# Patient Record
Sex: Female | Born: 1940
Health system: Southern US, Community
[De-identification: ages and names within clinical notes are randomized; demographics above are authoritative.]

## PROBLEM LIST (undated history)

## (undated) DIAGNOSIS — I10 Essential (primary) hypertension: Secondary | ICD-10-CM

## (undated) DIAGNOSIS — M545 Low back pain, unspecified: Secondary | ICD-10-CM

## (undated) DIAGNOSIS — E039 Hypothyroidism, unspecified: Secondary | ICD-10-CM

## (undated) DIAGNOSIS — L930 Discoid lupus erythematosus: Secondary | ICD-10-CM

## (undated) DIAGNOSIS — K219 Gastro-esophageal reflux disease without esophagitis: Secondary | ICD-10-CM

## (undated) DIAGNOSIS — E669 Obesity, unspecified: Secondary | ICD-10-CM

## (undated) DIAGNOSIS — G4733 Obstructive sleep apnea (adult) (pediatric): Secondary | ICD-10-CM

## (undated) DIAGNOSIS — I739 Peripheral vascular disease, unspecified: Secondary | ICD-10-CM

## (undated) DIAGNOSIS — I2510 Atherosclerotic heart disease of native coronary artery without angina pectoris: Secondary | ICD-10-CM

## (undated) DIAGNOSIS — E785 Hyperlipidemia, unspecified: Secondary | ICD-10-CM

## (undated) DIAGNOSIS — M51369 Other intervertebral disc degeneration, lumbar region without mention of lumbar back pain or lower extremity pain: Secondary | ICD-10-CM

## (undated) DIAGNOSIS — R0600 Dyspnea, unspecified: Secondary | ICD-10-CM

## (undated) DIAGNOSIS — C50919 Malignant neoplasm of unspecified site of unspecified female breast: Secondary | ICD-10-CM

## (undated) DIAGNOSIS — N289 Disorder of kidney and ureter, unspecified: Secondary | ICD-10-CM

## (undated) DIAGNOSIS — M5136 Other intervertebral disc degeneration, lumbar region: Secondary | ICD-10-CM

## (undated) HISTORY — DX: Peripheral vascular disease, unspecified: I73.9

## (undated) HISTORY — PX: OTHER SURGICAL HISTORY: SHX169

## (undated) HISTORY — DX: Dyspnea, unspecified: R06.00

## (undated) HISTORY — DX: Gastro-esophageal reflux disease without esophagitis: K21.9

## (undated) HISTORY — DX: Low back pain: M54.5

## (undated) HISTORY — DX: Obesity, unspecified: E66.9

## (undated) HISTORY — DX: Malignant neoplasm of unspecified site of unspecified female breast: C50.919

## (undated) HISTORY — DX: Discoid lupus erythematosus: L93.0

## (undated) HISTORY — DX: Hyperlipidemia, unspecified: E78.5

## (undated) HISTORY — PX: ELBOW SURGERY: SHX618

## (undated) HISTORY — DX: Atherosclerotic heart disease of native coronary artery without angina pectoris: I25.10

## (undated) HISTORY — DX: Obstructive sleep apnea (adult) (pediatric): G47.33

## (undated) HISTORY — DX: Essential (primary) hypertension: I10

## (undated) HISTORY — DX: Low back pain, unspecified: M54.50

## (undated) HISTORY — PX: RENAL ARTERY STENT: SHX2321

## (undated) HISTORY — DX: Other intervertebral disc degeneration, lumbar region without mention of lumbar back pain or lower extremity pain: M51.369

## (undated) HISTORY — DX: Other intervertebral disc degeneration, lumbar region: M51.36

## (undated) HISTORY — DX: Hypothyroidism, unspecified: E03.9

## (undated) HISTORY — PX: OOPHORECTOMY: SHX86

---

## 1983-07-23 HISTORY — PX: MASTECTOMY: SHX3

## 1995-07-23 HISTORY — PX: CORONARY ARTERY BYPASS GRAFT: SHX141

## 2000-05-15 ENCOUNTER — Ambulatory Visit (HOSPITAL_COMMUNITY): Admission: RE | Admit: 2000-05-15 | Discharge: 2000-05-15 | Payer: Self-pay | Admitting: *Deleted

## 2001-07-22 HISTORY — PX: CORONARY ANGIOPLASTY WITH STENT PLACEMENT: SHX49

## 2001-11-13 ENCOUNTER — Encounter: Admission: RE | Admit: 2001-11-13 | Discharge: 2001-11-13 | Payer: Self-pay | Admitting: Internal Medicine

## 2001-11-13 ENCOUNTER — Encounter: Payer: Self-pay | Admitting: Internal Medicine

## 2002-01-07 ENCOUNTER — Encounter: Payer: Self-pay | Admitting: *Deleted

## 2002-01-07 ENCOUNTER — Ambulatory Visit (HOSPITAL_COMMUNITY): Admission: RE | Admit: 2002-01-07 | Discharge: 2002-01-07 | Payer: Self-pay | Admitting: *Deleted

## 2004-01-20 HISTORY — PX: ANKLE SURGERY: SHX546

## 2004-02-15 ENCOUNTER — Inpatient Hospital Stay (HOSPITAL_COMMUNITY): Admission: EM | Admit: 2004-02-15 | Discharge: 2004-02-20 | Payer: Self-pay | Admitting: Emergency Medicine

## 2005-02-20 ENCOUNTER — Ambulatory Visit: Payer: Self-pay | Admitting: Internal Medicine

## 2005-03-11 ENCOUNTER — Ambulatory Visit: Payer: Self-pay | Admitting: Family Medicine

## 2005-06-10 ENCOUNTER — Ambulatory Visit: Payer: Self-pay | Admitting: Internal Medicine

## 2005-10-30 ENCOUNTER — Ambulatory Visit: Payer: Self-pay | Admitting: Internal Medicine

## 2006-06-20 ENCOUNTER — Ambulatory Visit: Payer: Self-pay | Admitting: Internal Medicine

## 2006-06-20 ENCOUNTER — Inpatient Hospital Stay (HOSPITAL_COMMUNITY): Admission: EM | Admit: 2006-06-20 | Discharge: 2006-06-24 | Payer: Self-pay | Admitting: Emergency Medicine

## 2006-10-30 ENCOUNTER — Ambulatory Visit: Payer: Self-pay | Admitting: Internal Medicine

## 2006-10-30 LAB — CONVERTED CEMR LAB
ALT: 17 units/L (ref 0–40)
AST: 24 units/L (ref 0–37)
Albumin: 3.6 g/dL (ref 3.5–5.2)
Alkaline Phosphatase: 115 units/L (ref 39–117)
BUN: 14 mg/dL (ref 6–23)
Bacteria, UA: NEGATIVE
Basophils Absolute: 0 10*3/uL (ref 0.0–0.1)
Basophils Relative: 0.7 % (ref 0.0–1.0)
Bilirubin Urine: NEGATIVE
Bilirubin, Direct: 0.1 mg/dL (ref 0.0–0.3)
CO2: 29 meq/L (ref 19–32)
Calcium: 9.6 mg/dL (ref 8.4–10.5)
Chloride: 109 meq/L (ref 96–112)
Cholesterol: 133 mg/dL (ref 0–200)
Creatinine, Ser: 1.1 mg/dL (ref 0.4–1.2)
Crystals: NEGATIVE
Eosinophils Absolute: 0.1 10*3/uL (ref 0.0–0.6)
Eosinophils Relative: 1.5 % (ref 0.0–5.0)
GFR calc Af Amer: 64 mL/min
GFR calc non Af Amer: 53 mL/min
Glucose, Bld: 102 mg/dL — ABNORMAL HIGH (ref 70–99)
HCT: 37.9 % (ref 36.0–46.0)
HDL: 29.7 mg/dL — ABNORMAL LOW (ref >39.0)
Hemoglobin, Urine: NEGATIVE
Hemoglobin: 12.4 g/dL (ref 12.0–15.0)
Ketones, ur: NEGATIVE mg/dL
LDL Cholesterol: 84 mg/dL (ref 0–99)
Lymphocytes Relative: 28.9 % (ref 12.0–46.0)
MCHC: 32.7 g/dL (ref 30.0–36.0)
MCV: 85.3 fL (ref 78.0–100.0)
Monocytes Absolute: 0.8 10*3/uL — ABNORMAL HIGH (ref 0.2–0.7)
Monocytes Relative: 13.1 % — ABNORMAL HIGH (ref 3.0–11.0)
Neutro Abs: 3.5 10*3/uL (ref 1.4–7.7)
Neutrophils Relative %: 55.8 % (ref 43.0–77.0)
Nitrite: NEGATIVE
Platelets: 263 10*3/uL (ref 150–400)
Potassium: 4.6 meq/L (ref 3.5–5.1)
RBC: 4.44 M/uL (ref 3.87–5.11)
RDW: 14.5 % (ref 11.5–14.6)
Sodium: 143 meq/L (ref 135–145)
Specific Gravity, Urine: 1.015 (ref 1.000–1.03)
TSH: 2.27 microintl units/mL (ref 0.35–5.50)
Total Bilirubin: 0.9 mg/dL (ref 0.3–1.2)
Total CHOL/HDL Ratio: 4.5
Total Protein, Urine: NEGATIVE mg/dL
Total Protein: 8.2 g/dL (ref 6.0–8.3)
Triglycerides: 99 mg/dL (ref 0–149)
Urine Glucose: NEGATIVE mg/dL
Urobilinogen, UA: 0.2 (ref 0.0–1.0)
VLDL: 20 mg/dL (ref 0–40)
WBC: 6.2 10*3/uL (ref 4.5–10.5)
pH: 5.5 (ref 5.0–8.0)

## 2006-12-10 ENCOUNTER — Emergency Department (HOSPITAL_COMMUNITY): Admission: EM | Admit: 2006-12-10 | Discharge: 2006-12-10 | Payer: Self-pay | Admitting: Emergency Medicine

## 2007-01-02 ENCOUNTER — Ambulatory Visit: Payer: Self-pay | Admitting: Endocrinology

## 2007-01-02 LAB — CONVERTED CEMR LAB: Uric Acid, Serum: 9.1 mg/dL — ABNORMAL HIGH (ref 2.4–7.0)

## 2007-01-08 ENCOUNTER — Ambulatory Visit: Payer: Self-pay | Admitting: Internal Medicine

## 2007-03-21 ENCOUNTER — Emergency Department (HOSPITAL_COMMUNITY): Admission: EM | Admit: 2007-03-21 | Discharge: 2007-03-22 | Payer: Self-pay | Admitting: Emergency Medicine

## 2007-06-11 DIAGNOSIS — Z853 Personal history of malignant neoplasm of breast: Secondary | ICD-10-CM | POA: Insufficient documentation

## 2007-06-11 DIAGNOSIS — M545 Low back pain, unspecified: Secondary | ICD-10-CM | POA: Insufficient documentation

## 2007-06-11 DIAGNOSIS — I2510 Atherosclerotic heart disease of native coronary artery without angina pectoris: Secondary | ICD-10-CM | POA: Insufficient documentation

## 2007-06-11 DIAGNOSIS — I10 Essential (primary) hypertension: Secondary | ICD-10-CM | POA: Insufficient documentation

## 2007-06-11 DIAGNOSIS — M810 Age-related osteoporosis without current pathological fracture: Secondary | ICD-10-CM | POA: Insufficient documentation

## 2007-06-11 DIAGNOSIS — I739 Peripheral vascular disease, unspecified: Secondary | ICD-10-CM | POA: Insufficient documentation

## 2007-06-11 DIAGNOSIS — K219 Gastro-esophageal reflux disease without esophagitis: Secondary | ICD-10-CM | POA: Insufficient documentation

## 2007-06-11 DIAGNOSIS — E039 Hypothyroidism, unspecified: Secondary | ICD-10-CM | POA: Insufficient documentation

## 2007-06-11 DIAGNOSIS — M109 Gout, unspecified: Secondary | ICD-10-CM | POA: Insufficient documentation

## 2007-06-11 DIAGNOSIS — E785 Hyperlipidemia, unspecified: Secondary | ICD-10-CM | POA: Insufficient documentation

## 2007-06-12 ENCOUNTER — Ambulatory Visit: Payer: Self-pay | Admitting: Internal Medicine

## 2007-06-12 DIAGNOSIS — L930 Discoid lupus erythematosus: Secondary | ICD-10-CM | POA: Insufficient documentation

## 2007-06-12 LAB — CONVERTED CEMR LAB
ALT: 17 units/L (ref 0–35)
AST: 21 units/L (ref 0–37)
Albumin: 3.7 g/dL (ref 3.5–5.2)
Alkaline Phosphatase: 129 units/L — ABNORMAL HIGH (ref 39–117)
Bilirubin, Direct: 0.1 mg/dL (ref 0.0–0.3)
Cholesterol: 148 mg/dL (ref 0–200)
HDL: 27.8 mg/dL — ABNORMAL LOW (ref >39.0)
LDL Cholesterol: 105 mg/dL — ABNORMAL HIGH (ref 0–99)
Rhuematoid fact SerPl-aCnc: 21.2 intl units/mL — ABNORMAL HIGH (ref 0.0–20.0)
Sed Rate: 79 mm/hr — ABNORMAL HIGH (ref 0–25)
Total Bilirubin: 1 mg/dL (ref 0.3–1.2)
Total CHOL/HDL Ratio: 5.3
Total Protein: 8.6 g/dL — ABNORMAL HIGH (ref 6.0–8.3)
Triglycerides: 78 mg/dL (ref 0–149)
VLDL: 16 mg/dL (ref 0–40)

## 2007-06-16 LAB — CONVERTED CEMR LAB
ANA Titer 1: 1:640 {titer} — ABNORMAL HIGH
Anti Nuclear Antibody(ANA): POSITIVE — AB
ds DNA Ab: 43 — ABNORMAL HIGH (ref <5)

## 2007-06-26 ENCOUNTER — Encounter: Payer: Self-pay | Admitting: Internal Medicine

## 2007-08-04 ENCOUNTER — Encounter: Payer: Self-pay | Admitting: Internal Medicine

## 2007-08-05 ENCOUNTER — Encounter: Payer: Self-pay | Admitting: Internal Medicine

## 2007-09-28 ENCOUNTER — Encounter: Payer: Self-pay | Admitting: Internal Medicine

## 2007-10-28 ENCOUNTER — Encounter: Payer: Self-pay | Admitting: Internal Medicine

## 2007-11-05 ENCOUNTER — Encounter: Admission: RE | Admit: 2007-11-05 | Discharge: 2007-11-05 | Payer: Self-pay | Admitting: Cardiovascular Disease

## 2007-11-10 ENCOUNTER — Inpatient Hospital Stay (HOSPITAL_COMMUNITY): Admission: RE | Admit: 2007-11-10 | Discharge: 2007-11-11 | Payer: Self-pay | Admitting: Cardiovascular Disease

## 2008-01-25 ENCOUNTER — Observation Stay (HOSPITAL_COMMUNITY): Admission: RE | Admit: 2008-01-25 | Discharge: 2008-01-26 | Payer: Self-pay | Admitting: Cardiovascular Disease

## 2008-02-17 ENCOUNTER — Encounter: Payer: Self-pay | Admitting: Internal Medicine

## 2008-03-08 ENCOUNTER — Encounter: Payer: Self-pay | Admitting: Internal Medicine

## 2008-03-22 ENCOUNTER — Encounter: Payer: Self-pay | Admitting: Internal Medicine

## 2008-06-07 ENCOUNTER — Telehealth: Payer: Self-pay | Admitting: Internal Medicine

## 2008-06-10 ENCOUNTER — Encounter: Payer: Self-pay | Admitting: Internal Medicine

## 2008-08-01 ENCOUNTER — Encounter: Payer: Self-pay | Admitting: Internal Medicine

## 2008-08-18 ENCOUNTER — Ambulatory Visit: Payer: Self-pay | Admitting: Oncology

## 2008-09-12 LAB — COMPREHENSIVE METABOLIC PANEL
ALT: 20 U/L (ref 0–35)
AST: 28 U/L (ref 0–37)
Albumin: 3.6 g/dL (ref 3.5–5.2)
Alkaline Phosphatase: 104 U/L (ref 39–117)
BUN: 11 mg/dL (ref 6–23)
CO2: 25 mEq/L (ref 19–32)
Calcium: 9.2 mg/dL (ref 8.4–10.5)
Chloride: 110 mEq/L (ref 96–112)
Creatinine, Ser: 1.09 mg/dL (ref 0.40–1.20)
Glucose, Bld: 97 mg/dL (ref 70–99)
Potassium: 3.9 mEq/L (ref 3.5–5.3)
Sodium: 141 mEq/L (ref 135–145)
Total Bilirubin: 0.9 mg/dL (ref 0.3–1.2)
Total Protein: 6.7 g/dL (ref 6.0–8.3)

## 2008-09-12 LAB — CBC WITH DIFFERENTIAL/PLATELET
BASO%: 0.8 % (ref 0.0–2.0)
Basophils Absolute: 0 10*3/uL (ref 0.0–0.1)
EOS%: 3.2 % (ref 0.0–7.0)
Eosinophils Absolute: 0.2 10*3/uL (ref 0.0–0.5)
HCT: 37.2 % (ref 34.8–46.6)
HGB: 12.5 g/dL (ref 11.6–15.9)
LYMPH%: 35.3 % (ref 14.0–49.7)
MCH: 28.4 pg (ref 25.1–34.0)
MCHC: 33.7 g/dL (ref 31.5–36.0)
MCV: 84.2 fL (ref 79.5–101.0)
MONO#: 0.5 10*3/uL (ref 0.1–0.9)
MONO%: 9.8 % (ref 0.0–14.0)
NEUT#: 2.6 10*3/uL (ref 1.5–6.5)
NEUT%: 50.9 % (ref 38.4–76.8)
Platelets: 220 10*3/uL (ref 145–400)
RBC: 4.42 10*6/uL (ref 3.70–5.45)
RDW: 14.2 % (ref 11.2–14.5)
WBC: 5 10*3/uL (ref 3.9–10.3)
lymph#: 1.8 10*3/uL (ref 0.9–3.3)

## 2008-09-12 LAB — LACTATE DEHYDROGENASE: LDH: 169 U/L (ref 94–250)

## 2008-11-22 ENCOUNTER — Telehealth: Payer: Self-pay | Admitting: Internal Medicine

## 2008-12-12 ENCOUNTER — Encounter: Admission: RE | Admit: 2008-12-12 | Discharge: 2008-12-12 | Payer: Self-pay | Admitting: Family Medicine

## 2008-12-14 ENCOUNTER — Inpatient Hospital Stay (HOSPITAL_COMMUNITY): Admission: EM | Admit: 2008-12-14 | Discharge: 2008-12-16 | Payer: Self-pay | Admitting: Emergency Medicine

## 2008-12-15 ENCOUNTER — Encounter (INDEPENDENT_AMBULATORY_CARE_PROVIDER_SITE_OTHER): Payer: Self-pay | Admitting: Internal Medicine

## 2009-03-23 ENCOUNTER — Ambulatory Visit: Payer: Self-pay | Admitting: Oncology

## 2009-03-28 LAB — CBC WITH DIFFERENTIAL/PLATELET
BASO%: 0.8 % (ref 0.0–2.0)
Basophils Absolute: 0 10*3/uL (ref 0.0–0.1)
EOS%: 2.4 % (ref 0.0–7.0)
Eosinophils Absolute: 0.1 10*3/uL (ref 0.0–0.5)
HCT: 36.2 % (ref 34.8–46.6)
HGB: 11.9 g/dL (ref 11.6–15.9)
LYMPH%: 36.5 % (ref 14.0–49.7)
MCH: 28.3 pg (ref 25.1–34.0)
MCHC: 32.9 g/dL (ref 31.5–36.0)
MCV: 86.2 fL (ref 79.5–101.0)
MONO#: 0.5 10*3/uL (ref 0.1–0.9)
MONO%: 10.2 % (ref 0.0–14.0)
NEUT#: 2.2 10*3/uL (ref 1.5–6.5)
NEUT%: 50.1 % (ref 38.4–76.8)
Platelets: 199 10*3/uL (ref 145–400)
RBC: 4.2 10*6/uL (ref 3.70–5.45)
RDW: 14.5 % (ref 11.2–14.5)
WBC: 4.5 10*3/uL (ref 3.9–10.3)
lymph#: 1.6 10*3/uL (ref 0.9–3.3)

## 2009-03-28 LAB — COMPREHENSIVE METABOLIC PANEL
ALT: 13 U/L (ref 0–35)
AST: 19 U/L (ref 0–37)
Albumin: 4 g/dL (ref 3.5–5.2)
Alkaline Phosphatase: 95 U/L (ref 39–117)
BUN: 15 mg/dL (ref 6–23)
CO2: 22 mEq/L (ref 19–32)
Calcium: 9.2 mg/dL (ref 8.4–10.5)
Chloride: 109 mEq/L (ref 96–112)
Creatinine, Ser: 1.13 mg/dL (ref 0.40–1.20)
Glucose, Bld: 81 mg/dL (ref 70–99)
Potassium: 4.1 mEq/L (ref 3.5–5.3)
Sodium: 143 mEq/L (ref 135–145)
Total Bilirubin: 0.6 mg/dL (ref 0.3–1.2)
Total Protein: 6.9 g/dL (ref 6.0–8.3)

## 2009-03-28 LAB — LACTATE DEHYDROGENASE: LDH: 194 U/L (ref 94–250)

## 2009-09-21 ENCOUNTER — Ambulatory Visit: Payer: Self-pay | Admitting: Oncology

## 2009-09-25 LAB — COMPREHENSIVE METABOLIC PANEL
ALT: 11 U/L (ref 0–35)
AST: 16 U/L (ref 0–37)
Albumin: 3.6 g/dL (ref 3.5–5.2)
Alkaline Phosphatase: 103 U/L (ref 39–117)
BUN: 11 mg/dL (ref 6–23)
CO2: 25 mEq/L (ref 19–32)
Calcium: 8.9 mg/dL (ref 8.4–10.5)
Chloride: 108 mEq/L (ref 96–112)
Creatinine, Ser: 1.05 mg/dL (ref 0.40–1.20)
Glucose, Bld: 76 mg/dL (ref 70–99)
Potassium: 3.9 mEq/L (ref 3.5–5.3)
Sodium: 143 mEq/L (ref 135–145)
Total Bilirubin: 0.6 mg/dL (ref 0.3–1.2)
Total Protein: 6.9 g/dL (ref 6.0–8.3)

## 2009-09-25 LAB — CBC WITH DIFFERENTIAL/PLATELET
BASO%: 0.4 % (ref 0.0–2.0)
Basophils Absolute: 0 10*3/uL (ref 0.0–0.1)
EOS%: 2.4 % (ref 0.0–7.0)
Eosinophils Absolute: 0.2 10*3/uL (ref 0.0–0.5)
HCT: 37.2 % (ref 34.8–46.6)
HGB: 12.1 g/dL (ref 11.6–15.9)
LYMPH%: 21.6 % (ref 14.0–49.7)
MCH: 28.5 pg (ref 25.1–34.0)
MCHC: 32.5 g/dL (ref 31.5–36.0)
MCV: 87.5 fL (ref 79.5–101.0)
MONO#: 0.8 10*3/uL (ref 0.1–0.9)
MONO%: 10.9 % (ref 0.0–14.0)
NEUT#: 4.6 10*3/uL (ref 1.5–6.5)
NEUT%: 64.7 % (ref 38.4–76.8)
Platelets: 251 10*3/uL (ref 145–400)
RBC: 4.26 10*6/uL (ref 3.70–5.45)
RDW: 15.4 % — ABNORMAL HIGH (ref 11.2–14.5)
WBC: 7.1 10*3/uL (ref 3.9–10.3)
lymph#: 1.5 10*3/uL (ref 0.9–3.3)

## 2009-09-25 LAB — LACTATE DEHYDROGENASE: LDH: 182 U/L (ref 94–250)

## 2009-11-21 ENCOUNTER — Encounter: Payer: Self-pay | Admitting: Cardiology

## 2010-01-03 ENCOUNTER — Ambulatory Visit: Payer: Self-pay | Admitting: Cardiology

## 2010-01-03 DIAGNOSIS — R0602 Shortness of breath: Secondary | ICD-10-CM | POA: Insufficient documentation

## 2010-08-21 NOTE — Consult Note (Signed)
Summary: Claria Dice   Imported By: Esmeralda Links D'jimraou 08/11/2007 12:04:55  _____________________________________________________________________  External Attachment:    Type:   Image     Comment:   External Document

## 2010-08-21 NOTE — Consult Note (Signed)
Summary: Stacey Drain, MD  Stacey Drain, MD   Imported By: Maryln Gottron 09/30/2007 15:00:30  _____________________________________________________________________  External Attachment:    Type:   Image     Comment:   External Document

## 2010-08-21 NOTE — Letter (Signed)
Summary: [facility]   Imported By: Debby Freiberg 01/16/2010 12:20:46  _____________________________________________________________________  External Attachment:    Type:   Image     Comment:   External Document

## 2010-08-21 NOTE — Assessment & Plan Note (Signed)
Summary: np6/get establish with cardio/jml   Primary Provider:  Dr. Parke Simmers  CC:  new patient.  Establish with cardiologist.  Stent in 99.  History of Present Illness: 70 yo with history of CAD s/p CABG, PAD, HTN, and exertional dyspnea presents for cardiology evaluation.  Patient's most recent cath was in 2003 and showed patent grafts.  She had a left common iliac artery stent in 2009.  She has been stable recently with no chest pain.  She is taking Imdur but has been enduring severe headaches with it for about a year and a half and really wants to stop it.  She has chronic exertional dyspnea.  She can walk on flat ground without problems (walks for 15 minutes daily for exercise) but gets short of breath with hills or steps.  She is short of breath with moderate housework such as vacuuming and sweeping.  No claudication.  No orthopnea/PND.  She does seem to be troubled by symptoms consistent with plantar fasciitis.    ECG: NSR, LVH, Qs in V1 and V2  Labs (5/11): K 4.3, creatinine 1.11, LDL 63, HDL 40 ' Current Medications (verified): 1)  Clonidine Hcl 0.1 Mg Tabs (Clonidine Hcl) .Marland Kitchen.. 1 By Mouth Bid 2)  Isosorbide Mononitrate Cr 30 Mg Tb24 (Isosorbide Mononitrate) .Marland Kitchen.. 1 Po Qd 3)  Lipitor 40 Mg Tabs (Atorvastatin Calcium) .Marland Kitchen.. 1 By Mouth Qd 4)  Lotrel 10-20 Mg Caps (Amlodipine Besy-Benazepril Hcl) .Marland Kitchen.. 1 By Mouth Qd 5)  Metoprolol Tartrate 50 Mg Tabs (Metoprolol Tartrate) .Marland Kitchen.. 1 By Mouth Bid 6)  Levothyroxine Sodium 125 Mcg Tabs (Levothyroxine Sodium) .... Once Daily 7)  Ecotrin 325 Mg  Tbec (Aspirin) .Marland Kitchen.. 1 By Mouth Qd 8)  Carvedilol 6.25 Mg Tabs (Carvedilol) .... Take One Tablet Two Times A Day 9)  Allopurinol 300 Mg Tabs (Allopurinol) .... Take One Tablet Once Daily 10)  Plavix 75 Mg Tabs (Clopidogrel Bisulfate) .... Take One Tablet Once Daily 11)  Hydroxychloroquine Sulfate 200 Mg Tabs (Hydroxychloroquine Sulfate) .... Take 2 Tablets By Mouth Once Daily 12)  Guanfacine Hcl 2 Mg Tabs  (Guanfacine Hcl) .... Take One Tablet By Mouth At Bedtime  Allergies (verified): No Known Drug Allergies  Past History:  Past Medical History: 1. Coronary artery disease: CABG 1997 with sequential LIMA-LAD and D, sequential SVG-PDA then PLV1 then PLV2.  There also was an SVG attached to the SVG-PDA that touches down on an OM.  Cath in 2003 showed patent grafts.  2. Hyperlipidemia 3. Hypertension 4. Hypothyroidism 5. Gout 6. Osteoporosis 7. Breast cancer s/p right mastectomy in 1985 8. GERD 9. Peripheral vascular disease: Left common iliac artery stent in 7/09.  Renal artery stenosis with left renal artery stent.  Cath in 4/09 showed 50% in-stent restenosis in left renal artery stent.  10. Low back pain/lumbar disc disease 11. Obesity 12. Discoid lupus 13. OSA, did not tolerate CPAP 14. Dyspnea: echo (5/10) with EF 55-60%, no regional wall motion abnormalities, mild LVH, mild diastolic dysfunction, mild aortic insufficiency, mild MR.   Family History: Reviewed history from 06/11/2007 and no changes required. Family History of CAD Female 1st degree relative Family History Diabetes 1st degree relative Family History Hypertension mother with cerebral aneurysm brother died with stroke  Social History: Former Smoker, quit 1997 Alcohol use-no Lives in [place].  Has 7 adopted children, 2 live with her now.  Retired.   Review of Systems       All systems reviewed and negative except as per HPI.   Vital Signs:  Patient profile:   70 year old female Height:      66 inches Weight:      216 pounds BMI:     34.99 Pulse rate:   79 / minute Pulse rhythm:   regular BP sitting:   148 / 78  (left arm) Cuff size:   large  Vitals Entered By: Judithe Modest CMA (January 03, 2010 3:08 PM)  Physical Exam  General:  Well developed, well nourished, in no acute distress.  Obese.  Head:  normocephalic and atraumatic Nose:  no deformity, discharge, inflammation, or lesions Mouth:  Teeth,  gums and palate normal. Oral mucosa normal. Neck:  Neck supple, no JVD. No masses, thyromegaly or abnormal cervical nodes. Lungs:  Clear bilaterally to auscultation and percussion. Heart:  Non-displaced PMI, chest non-tender; regular rate and rhythm, S1, S2 without rubs or gallops. 2/6 early systolic murmur RUSB.  Carotid upstroke normal, no bruit.  1+ PT and DP pulses bilaterally. No edema, no varicosities. Abdomen:  Bowel sounds positive; abdomen soft and non-tender without masses, organomegaly, or hernias noted. No hepatosplenomegaly. Msk:  Back normal, normal gait. Muscle strength and tone normal. Extremities:  No clubbing or cyanosis. Neurologic:  Alert and oriented x 3. Skin:  Intact without lesions or rashes. Psych:  Normal affect.   Impression & Recommendations:  Problem # 1:  CORONARY ARTERY DISEASE (ICD-414.00) Stable with no chest pain.  I think it would be reasonable to stop Imdur.  Instead, I will increase Coreg to 12.5 mg two times a day.  She will continue statin, ACEI, ASA, and Plavix.    Problem # 2:  PERIPHERAL VASCULAR DISEASE (ICD-443.9) Stable with no claudication and palpable pedal pulses.   Problem # 3:  HYPERLIPIDEMIA (ICD-272.4) LDL is < 70 (goal).  Continue statin.    Problem # 4:  HYPERTENSION (ICD-401.9) BP is mildly elevated.  Increasing Coreg (as above) should help.   Problem # 5:  DYSPNEA (ICD-786.05) Stable dyspnea with moderate exertion.  This is likely multifactorial from obesity/deconditioning and diastolic CHF.  Need to make sure that BP is under good control.  She does not appear volume overloaded on exam.    Other Orders: EKG w/ Interpretation (93000)  Patient Instructions: 1)  Your physician has recommended you make the following change in your medication:  2)  Increase Coreg(carvedilol) to 12.5mg  twice a day 3)  Your physician wants you to follow-up in: 6 months with Dr Shirlee Latch.   You will receive a reminder letter in the mail two months in  advance. If you don't receive a letter, please call our office to schedule the follow-up appointment. Prescriptions: COREG 12.5 MG TABS (CARVEDILOL) one tablet two times a day  #60 x 11   Entered by:   Katina Dung, RN, BSN   Authorized by:   Marca Ancona, MD   Signed by:   Katina Dung, RN, BSN on 01/03/2010   Method used:   Electronically to        [facility]  [phone]* (retail)       [address]       [place], [place]  [address]       Ph: [phone] or [phone]       Fax: [phone]   RxID:   [id number]

## 2010-08-21 NOTE — Letter (Signed)
Summary: Aundra Dubin, MD Rheumatology  Aundra Dubin, MD Rheumatology   Imported By: Lanelle Bal 06/18/2008 10:39:32  _____________________________________________________________________  External Attachment:    Type:   Image     Comment:   External Document

## 2010-08-21 NOTE — Medication Information (Signed)
Summary: Rx for Furosemide/[facility]  Rx for Furosemide/[facility]   Imported By: Esmeralda Links D'jimraou 02/19/2008 14:59:24  _____________________________________________________________________  External Attachment:    Type:   Image     Comment:   External Document

## 2010-08-21 NOTE — Progress Notes (Signed)
  Phone Note Refill Request Message from:  Fax from Pharmacy on June 07, 2008 11:54 AM  Refills Requested: Medication #1:  CLONIDINE HCL 0.1 MG TABS 1 by mouth bid right source fax [phone]   Method Requested: Pick up at Office Initial call taken by: Payton Spark CMA,  June 07, 2008 11:54 AM  Follow-up for Phone Call        per protocol - one month ok and needs ROV Follow-up by: Corwin Levins MD,  June 07, 2008 1:27 PM      Prescriptions: CLONIDINE HCL 0.1 MG TABS (CLONIDINE HCL) 1 by mouth bid  #60 x 0   Entered by:   Payton Spark CMA   Authorized by:   Corwin Levins MD   Signed by:   Payton Spark CMA on 06/07/2008   Method used:   Printed then faxed to ...       [facility]  [address]  [phone]* (retail)       [address]       [place], [place]  [address]       Ph: [phone] or [phone]       Fax: [phone]   RxID:   [id number]

## 2010-08-21 NOTE — Assessment & Plan Note (Signed)
Summary: DR Zapien DX'D HER WITH LUPIS/NWS   Vital Signs:  Patient Profile:   70 Years Old Female Height:     66 inches Weight:      209 pounds Temp:     98.4 degrees F oral Pulse rate:   75 / minute BP sitting:   157 / 86  (right arm) Cuff size:   large  Vitals Entered By: Maris Berger (June 12, 2007 11:20 AM)                 Chief Complaint:  recently dx w/ lupus/needs note for son.  History of Present Illness: recently diagnosed with discoid lupus per derm, here to see if more involved than that; now c/o fatigue, some vague pressure to left head and face, requests note due to recent falls for son to stay with her over the holidays -  Current Allergies (reviewed today): No known allergies  Updated/Current Medications (including changes made in today's visit):  ACTONEL 35 MG TABS (RISEDRONATE SODIUM) Take 1 tablet by mouth once a week CLONIDINE HCL 0.1 MG TABS (CLONIDINE HCL) 1 by mouth bid ISOSORBIDE MONONITRATE CR 30 MG TB24 (ISOSORBIDE MONONITRATE) 1 po qd LASIX 20 MG TABS (FUROSEMIDE) 1 by mouth qd LIPITOR 40 MG TABS (ATORVASTATIN CALCIUM) 1 by mouth qd LOTREL 10-20 MG CAPS (AMLODIPINE BESY-BENAZEPRIL HCL) 1 by mouth qd METOPROLOL TARTRATE 50 MG TABS (METOPROLOL TARTRATE) 1 by mouth bid SYNTHROID 75 MCG TABS (LEVOTHYROXINE SODIUM) 1 by mouth qd COLCHICINE 0.6 MG  TABS (COLCHICINE) 1 by mouth qd ECOTRIN 325 MG  TBEC (ASPIRIN) 1 by mouth qd   Past Medical History:    Reviewed history from 06/11/2007 and no changes required:       Coronary artery disease       Hyperlipidemia       Hypertension       Hypothyroidism       Gout       Osteoporosis       Breast cancer, hx of       GERD       Peripheral vascular disease       Low back pain/lumbar disc disease       AAA - 2.5 cm 2001  Past Surgical History:    Reviewed history from 06/11/2007 and no changes required:       Hysterectomy       left ankle surgury 7/05 after fracture       s/p right  mastectomy       Coronary artery bypass graft       s/p left renal stent       Oophorectomy x 1       s/p right elbow surgury   Family History:    Reviewed history from 06/11/2007 and no changes required:       Family History of CAD Female 1st degree relative       Family History Diabetes 1st degree relative       Family History Hypertension       mother with cerebral aneurysm       brother died with stroke  Social History:    Reviewed history from 06/11/2007 and no changes required:       Former Smoker       Alcohol use-no     Physical Exam  General:     Well-developed,well-nourished,in no acute distress; alert,appropriate and cooperative throughout examination Head:     Normocephalic and atraumatic without obvious abnormalities. No  apparent alopecia or balding. Eyes:     No corneal or conjunctival inflammation noted. EOMI. Perrla Ears:     External ear exam shows no significant lesions or deformities.  Otoscopic examination reveals clear canals, tympanic membranes are intact bilaterally without bulging, retraction, inflammation or discharge. Hearing is grossly normal bilaterally. Nose:     External nasal examination shows no deformity or inflammation. Nasal mucosa are pink and moist without lesions or exudates. Mouth:     Oral mucosa and oropharynx without lesions or exudates.  Teeth in good repair. Neck:     No deformities, masses, or tenderness noted. Lungs:     Normal respiratory effort, chest expands symmetrically. Lungs are clear to auscultation, no crackles or wheezes. Heart:     Normal rate and regular rhythm. S1 and S2 normal without gallop, murmur, click, rub or other extra sounds. Extremities:     no edema    Impression & Recommendations:  Problem # 1:  LUPUS ERYTHEMATOSUS, DISCOID (ICD-695.4)  Her updated medication list for this problem includes:    Ecotrin 325 Mg Tbec (Aspirin) .Marland Kitchen... 1 by mouth once daily  check for systemkic involvement with labs,  Refer Rheum for futher eval  Her updated medication list for this problem includes:    Ecotrin 325 Mg Tbec (Aspirin) .Marland Kitchen... 1 by mouth qd  Orders: TLB-Sedimentation Rate (ESR) (85651-ESR) TLB-Rheumatoid Factor (RA) (86430-RA) T-ANA (51884-16606) T-DNA Antibody (30160-10932) Rheumatology Referral (Rheumatology)   Problem # 2:  HYPERLIPIDEMIA (ICD-272.4)  Her updated medication list for this problem includes:    Lipitor 40 Mg Tabs (Atorvastatin calcium) .Marland Kitchen... 1 by mouth once daily check lipids, lft's  Her updated medication list for this problem includes:    Lipitor 40 Mg Tabs (Atorvastatin calcium) .Marland Kitchen... 1 by mouth qd  Orders: TLB-Lipid Panel (80061-LIPID)   Problem # 3:  HYPERTENSION (ICD-401.9)  Her updated medication list for this problem includes:    Clonidine Hcl 0.1 Mg Tabs (Clonidine hcl) .Marland Kitchen... 1 by mouth bid    Lasix 20 Mg Tabs (Furosemide) .Marland Kitchen... 1 by mouth qd    Lotrel 10-20 Mg Caps (Amlodipine besy-benazepril hcl) .Marland Kitchen... 1 by mouth qd    Metoprolol Tartrate 50 Mg Tabs (Metoprolol tartrate) .Marland Kitchen... 1 by mouth bid   Complete Medication List: 1)  Actonel 35 Mg Tabs (Risedronate sodium) .... Take 1 tablet by mouth once a week 2)  Clonidine Hcl 0.1 Mg Tabs (Clonidine hcl) .Marland Kitchen.. 1 by mouth bid 3)  Isosorbide Mononitrate Cr 30 Mg Tb24 (Isosorbide mononitrate) .Marland Kitchen.. 1 po qd 4)  Lasix 20 Mg Tabs (Furosemide) .Marland Kitchen.. 1 by mouth qd 5)  Lipitor 40 Mg Tabs (Atorvastatin calcium) .Marland Kitchen.. 1 by mouth qd 6)  Lotrel 10-20 Mg Caps (Amlodipine besy-benazepril hcl) .Marland Kitchen.. 1 by mouth qd 7)  Metoprolol Tartrate 50 Mg Tabs (Metoprolol tartrate) .Marland Kitchen.. 1 by mouth bid 8)  Synthroid 75 Mcg Tabs (Levothyroxine sodium) .Marland Kitchen.. 1 by mouth qd 9)  Colchicine 0.6 Mg Tabs (Colchicine) .Marland Kitchen.. 1 by mouth qd 10)  Ecotrin 325 Mg Tbec (Aspirin) .Marland Kitchen.. 1 by mouth qd  Other Orders: TLB-Hepatic/Liver Function Pnl (80076-HEPATIC)   Patient Instructions: 1)  To get blood work today 2)  Continue all medications as is 3)   You will be called about the rheumatology referral 4)  Please schedule a follow-up appointment in 6 months.    ]

## 2010-08-21 NOTE — Progress Notes (Signed)
Summary: clonidine  Phone Note Refill Request Message from:  Fax from Pharmacy on Nov 22, 2008 8:31 AM  Refills Requested: Medication #1:  CLONIDINE HCL 0.1 MG TABS 1 by mouth bid   Dosage confirmed as above?Dosage Confirmed Initial call taken by: Windell Norfolk,  Nov 22, 2008 8:31 AM  Follow-up for Phone Call        to denae for routine refills Follow-up by: Corwin Levins MD,  Nov 22, 2008 12:52 PM      Prescriptions: CLONIDINE HCL 0.1 MG TABS (CLONIDINE HCL) 1 by mouth bid  #60 x 6   Entered by:   Windell Norfolk   Authorized by:   Corwin Levins MD   Signed by:   Windell Norfolk on 11/22/2008   Method used:   Electronically to        [facility]  [phone]* (retail)       [address]       [place], [place]  [address]       Ph: [phone] or [phone]       Fax: [phone]   RxID:   [id number]

## 2010-08-21 NOTE — Medication Information (Signed)
Summary: Rx for Levothyroxin/[facility]  Rx for Levothyroxin/[facility]   Imported By: Esmeralda Links D'jimraou 11/12/2007 13:10:10  _____________________________________________________________________  External Attachment:    Type:   Image     Comment:   External Document

## 2010-08-21 NOTE — Medication Information (Signed)
Summary: Rx for Clonidine/[facility]  Rx for Clonidine/[facility]   Imported By: Esmeralda Links D'jimraou 08/09/2008 11:24:14  _____________________________________________________________________  External Attachment:    Type:   Image     Comment:   External Document

## 2010-08-21 NOTE — Consult Note (Signed)
Summary: Rheumatology [name])/Skin Lupus  Rheumatology [facility] Young)/Skin Lupus   Imported By: Esmeralda Links D'jimraou 07/07/2007 13:29:07  _____________________________________________________________________  External Attachment:    Type:   Image     Comment:   External Document

## 2010-08-21 NOTE — Miscellaneous (Signed)
  Clinical Lists Changes  Medications: Rx of LOTREL 10-20 MG CAPS (AMLODIPINE BESY-BENAZEPRIL HCL) 1 by mouth qd;  #30 x 5;  Signed;  Entered by: Maris Berger;  Authorized by: Corwin Levins MD;  Method used: Electronic    Prescriptions: LOTREL 10-20 MG CAPS (AMLODIPINE BESY-BENAZEPRIL HCL) 1 by mouth qd  #30 x 5   Entered by:   Maris Berger   Authorized by:   Corwin Levins MD   Signed by:   Maris Berger on 08/05/2007   Method used:   Electronically sent to ...       [facility]  [address]  [phone]*       [address]       [place], [place]  [address]       Ph: [phone] or [phone]       Fax: [phone]   RxID:   [id number]

## 2010-08-21 NOTE — Consult Note (Signed)
Summary: Aundra Dubin, MD Rheumatology  Aundra Dubin, MD Rheumatology   Imported By: Lanelle Bal 03/15/2008 11:49:43  _____________________________________________________________________  External Attachment:    Type:   Image     Comment:   External Document

## 2010-10-17 ENCOUNTER — Telehealth: Payer: Self-pay | Admitting: Cardiology

## 2010-10-17 NOTE — Telephone Encounter (Signed)
LMTCB

## 2010-10-18 NOTE — Telephone Encounter (Signed)
LMTCB

## 2010-10-23 NOTE — Telephone Encounter (Signed)
LMTCB [phone]/ [id number] no longer in service

## 2010-10-30 LAB — BASIC METABOLIC PANEL
BUN: 11 mg/dL (ref 6–23)
BUN: 31 mg/dL — ABNORMAL HIGH (ref 6–23)
BUN: 40 mg/dL — ABNORMAL HIGH (ref 6–23)
BUN: 48 mg/dL — ABNORMAL HIGH (ref 6–23)
CO2: 13 mEq/L — ABNORMAL LOW (ref 19–32)
CO2: 14 mEq/L — ABNORMAL LOW (ref 19–32)
CO2: 14 mEq/L — ABNORMAL LOW (ref 19–32)
CO2: 24 mEq/L (ref 19–32)
Calcium: 10 mg/dL (ref 8.4–10.5)
Calcium: 8.4 mg/dL (ref 8.4–10.5)
Calcium: 9.5 mg/dL (ref 8.4–10.5)
Calcium: 9.7 mg/dL (ref 8.4–10.5)
Chloride: 110 mEq/L (ref 96–112)
Chloride: 118 mEq/L — ABNORMAL HIGH (ref 96–112)
Chloride: 118 mEq/L — ABNORMAL HIGH (ref 96–112)
Chloride: 120 mEq/L — ABNORMAL HIGH (ref 96–112)
Creatinine, Ser: 1.17 mg/dL (ref 0.4–1.2)
Creatinine, Ser: 1.47 mg/dL — ABNORMAL HIGH (ref 0.4–1.2)
Creatinine, Ser: 1.82 mg/dL — ABNORMAL HIGH (ref 0.4–1.2)
Creatinine, Ser: 2.21 mg/dL — ABNORMAL HIGH (ref 0.4–1.2)
GFR calc Af Amer: 27 mL/min — ABNORMAL LOW (ref >60)
GFR calc Af Amer: 33 mL/min — ABNORMAL LOW (ref >60)
GFR calc Af Amer: 43 mL/min — ABNORMAL LOW (ref >60)
GFR calc Af Amer: 56 mL/min — ABNORMAL LOW (ref >60)
GFR calc non Af Amer: 22 mL/min — ABNORMAL LOW (ref >60)
GFR calc non Af Amer: 28 mL/min — ABNORMAL LOW (ref >60)
GFR calc non Af Amer: 35 mL/min — ABNORMAL LOW (ref >60)
GFR calc non Af Amer: 46 mL/min — ABNORMAL LOW (ref >60)
Glucose, Bld: 105 mg/dL — ABNORMAL HIGH (ref 70–99)
Glucose, Bld: 107 mg/dL — ABNORMAL HIGH (ref 70–99)
Glucose, Bld: 138 mg/dL — ABNORMAL HIGH (ref 70–99)
Glucose, Bld: 96 mg/dL (ref 70–99)
Potassium: 3.5 mEq/L (ref 3.5–5.1)
Potassium: 5.1 mEq/L (ref 3.5–5.1)
Potassium: 5.2 mEq/L — ABNORMAL HIGH (ref 3.5–5.1)
Potassium: 6 mEq/L — ABNORMAL HIGH (ref 3.5–5.1)
Sodium: 139 mEq/L (ref 135–145)
Sodium: 140 mEq/L (ref 135–145)
Sodium: 142 mEq/L (ref 135–145)
Sodium: 143 mEq/L (ref 135–145)

## 2010-10-30 LAB — URINALYSIS, ROUTINE W REFLEX MICROSCOPIC
Bilirubin Urine: NEGATIVE
Glucose, UA: NEGATIVE mg/dL
Hgb urine dipstick: NEGATIVE
Ketones, ur: NEGATIVE mg/dL
Nitrite: NEGATIVE
Protein, ur: NEGATIVE mg/dL
Specific Gravity, Urine: 1.013 (ref 1.005–1.030)
Urobilinogen, UA: 0.2 mg/dL (ref 0.0–1.0)
pH: 5.5 (ref 5.0–8.0)

## 2010-10-30 LAB — CULTURE, BLOOD (ROUTINE X 2)
Culture: NO GROWTH
Culture: NO GROWTH

## 2010-10-30 LAB — CLOSTRIDIUM DIFFICILE EIA
C difficile Toxins A+B, EIA: NEGATIVE
C difficile Toxins A+B, EIA: NEGATIVE

## 2010-10-30 LAB — GLUCOSE, CAPILLARY
Glucose-Capillary: 104 mg/dL — ABNORMAL HIGH (ref 70–99)
Glucose-Capillary: 109 mg/dL — ABNORMAL HIGH (ref 70–99)
Glucose-Capillary: 113 mg/dL — ABNORMAL HIGH (ref 70–99)
Glucose-Capillary: 132 mg/dL — ABNORMAL HIGH (ref 70–99)
Glucose-Capillary: 81 mg/dL (ref 70–99)
Glucose-Capillary: 91 mg/dL (ref 70–99)
Glucose-Capillary: 92 mg/dL (ref 70–99)
Glucose-Capillary: 95 mg/dL (ref 70–99)
Glucose-Capillary: 99 mg/dL (ref 70–99)

## 2010-10-30 LAB — LIPID PANEL
Cholesterol: 93 mg/dL (ref 0–200)
HDL: 20 mg/dL — ABNORMAL LOW (ref >39)
LDL Cholesterol: 58 mg/dL (ref 0–99)
Total CHOL/HDL Ratio: 4.7 RATIO
Triglycerides: 77 mg/dL (ref <150)
VLDL: 15 mg/dL (ref 0–40)

## 2010-10-30 LAB — COMPREHENSIVE METABOLIC PANEL
ALT: 20 U/L (ref 0–35)
ALT: 23 U/L (ref 0–35)
AST: 24 U/L (ref 0–37)
AST: 28 U/L (ref 0–37)
Albumin: 3.9 g/dL (ref 3.5–5.2)
Albumin: 4.2 g/dL (ref 3.5–5.2)
Alkaline Phosphatase: 110 U/L (ref 39–117)
Alkaline Phosphatase: 113 U/L (ref 39–117)
BUN: 21 mg/dL (ref 6–23)
BUN: 53 mg/dL — ABNORMAL HIGH (ref 6–23)
CO2: 17 mEq/L — ABNORMAL LOW (ref 19–32)
CO2: 18 mEq/L — ABNORMAL LOW (ref 19–32)
Calcium: 9.3 mg/dL (ref 8.4–10.5)
Calcium: 9.6 mg/dL (ref 8.4–10.5)
Chloride: 114 mEq/L — ABNORMAL HIGH (ref 96–112)
Chloride: 115 mEq/L — ABNORMAL HIGH (ref 96–112)
Creatinine, Ser: 1.37 mg/dL — ABNORMAL HIGH (ref 0.4–1.2)
Creatinine, Ser: 2.44 mg/dL — ABNORMAL HIGH (ref 0.4–1.2)
GFR calc Af Amer: 24 mL/min — ABNORMAL LOW (ref >60)
GFR calc Af Amer: 46 mL/min — ABNORMAL LOW (ref >60)
GFR calc non Af Amer: 20 mL/min — ABNORMAL LOW (ref >60)
GFR calc non Af Amer: 38 mL/min — ABNORMAL LOW (ref >60)
Glucose, Bld: 101 mg/dL — ABNORMAL HIGH (ref 70–99)
Glucose, Bld: 83 mg/dL (ref 70–99)
Potassium: 4.3 mEq/L (ref 3.5–5.1)
Potassium: 7.5 mEq/L (ref 3.5–5.1)
Sodium: 135 mEq/L (ref 135–145)
Sodium: 142 mEq/L (ref 135–145)
Total Bilirubin: 1.1 mg/dL (ref 0.3–1.2)
Total Bilirubin: 1.1 mg/dL (ref 0.3–1.2)
Total Protein: 7.7 g/dL (ref 6.0–8.3)
Total Protein: 8.3 g/dL (ref 6.0–8.3)

## 2010-10-30 LAB — CBC
HCT: 31.4 % — ABNORMAL LOW (ref 36.0–46.0)
HCT: 37.1 % (ref 36.0–46.0)
HCT: 37.6 % (ref 36.0–46.0)
Hemoglobin: 10.6 g/dL — ABNORMAL LOW (ref 12.0–15.0)
Hemoglobin: 12.4 g/dL (ref 12.0–15.0)
Hemoglobin: 12.6 g/dL (ref 12.0–15.0)
MCHC: 33.3 g/dL (ref 30.0–36.0)
MCHC: 33.5 g/dL (ref 30.0–36.0)
MCHC: 33.8 g/dL (ref 30.0–36.0)
MCV: 84.1 fL (ref 78.0–100.0)
MCV: 84.5 fL (ref 78.0–100.0)
MCV: 84.7 fL (ref 78.0–100.0)
Platelets: 154 10*3/uL (ref 150–400)
Platelets: 180 10*3/uL (ref 150–400)
Platelets: 206 10*3/uL (ref 150–400)
RBC: 3.71 MIL/uL — ABNORMAL LOW (ref 3.87–5.11)
RBC: 4.39 MIL/uL (ref 3.87–5.11)
RBC: 4.47 MIL/uL (ref 3.87–5.11)
RDW: 14.6 % (ref 11.5–15.5)
RDW: 14.6 % (ref 11.5–15.5)
RDW: 14.7 % (ref 11.5–15.5)
WBC: 4.1 10*3/uL (ref 4.0–10.5)
WBC: 4.5 10*3/uL (ref 4.0–10.5)
WBC: 6.1 10*3/uL (ref 4.0–10.5)

## 2010-10-30 LAB — DIFFERENTIAL
Basophils Absolute: 0 10*3/uL (ref 0.0–0.1)
Basophils Relative: 0 % (ref 0–1)
Eosinophils Absolute: 0.1 10*3/uL (ref 0.0–0.7)
Eosinophils Relative: 2 % (ref 0–5)
Lymphocytes Relative: 36 % (ref 12–46)
Lymphs Abs: 1.6 10*3/uL (ref 0.7–4.0)
Monocytes Absolute: 0.5 10*3/uL (ref 0.1–1.0)
Monocytes Relative: 12 % (ref 3–12)
Neutro Abs: 2.2 10*3/uL (ref 1.7–7.7)
Neutrophils Relative %: 50 % (ref 43–77)

## 2010-10-30 LAB — CARDIAC PANEL(CRET KIN+CKTOT+MB+TROPI)
CK, MB: 3.4 ng/mL (ref 0.3–4.0)
Relative Index: 2.9 — ABNORMAL HIGH (ref 0.0–2.5)
Total CK: 116 U/L (ref 7–177)
Troponin I: 0.01 ng/mL (ref 0.00–0.06)

## 2010-10-30 LAB — CK TOTAL AND CKMB (NOT AT ARMC)
CK, MB: 3.1 ng/mL (ref 0.3–4.0)
Relative Index: 2.5 (ref 0.0–2.5)
Total CK: 122 U/L (ref 7–177)

## 2010-10-30 LAB — TSH: TSH: 2.962 u[IU]/mL (ref 0.350–4.500)

## 2010-10-30 LAB — BRAIN NATRIURETIC PEPTIDE: Pro B Natriuretic peptide (BNP): 45 pg/mL (ref 0.0–100.0)

## 2010-10-30 LAB — URINE MICROSCOPIC-ADD ON

## 2010-10-30 LAB — TROPONIN I: Troponin I: 0.02 ng/mL (ref 0.00–0.06)

## 2010-11-01 ENCOUNTER — Telehealth: Payer: Self-pay | Admitting: Cardiology

## 2010-11-01 NOTE — Telephone Encounter (Signed)
I was finally able to get in touch with pt by telephone today. Pt states she felt bad for a day or 2 about 10 days ago,  but feels fine  now. She has no complaints now including chest pain, SOB , or tingling in her right arm. She is overdue for an appt with Dr Shirlee Latch. I scheduled pt an appt with Dr Shirlee Latch 12/10/10 (she requested a Monday AM) at 9:30am. Pt knows to call back if her symptoms return.

## 2010-11-01 NOTE — Telephone Encounter (Signed)
See note dated 10/17/10

## 2010-11-01 NOTE — Telephone Encounter (Signed)
LMTCB [phone]

## 2010-12-04 NOTE — H&P (Signed)
[name], Young                ACCOUNT NO.:  [id number]   MEDICAL RECORD NO.:  [id number]          PATIENT TYPE:  INP   LOCATION:  4742                         FACILITY:  [facility]   PHYSICIAN:  [name], M.D.      DATE OF BIRTH:  [birth year]   DATE OF ADMISSION:  12/13/2008  DATE OF DISCHARGE:                              HISTORY & PHYSICAL   PRIMARY CARE PHYSICIAN:  Michelle Rakers, [name]   PRESENTING COMPLAINT:  Dehydration and hypertension.   HISTORY OF PRESENT ILLNESS:  The patient is Michelle 70 year old female with  multiple medical problems who was apparently seen by [name] in her  office the day earlier.  She has been feeling weak and generally sick  for awhile.  Per patient, she actually moved to [place] for about Michelle month,  and since arriving there she has been having diarrhea, almost on Michelle daily  basis up to 4-5 times Michelle day.  This has gone on for month until she came  back to [name] who did Young work and called her to come to the  emergency room because potassium was elevated.  She denied any fever or  chills, but generally weak.  Denied any nausea or vomiting.  Denied any  fever.  No melena.  No bright red Young per rectum.  In the ED, she was  found to have Michelle potassium of 7.5.   PAST MEDICAL HISTORY:  1. Breast cancer.  2. Congestive heart failure.  3. History of coronary artery disease.  4. Dyslipidemia.  5. Gout.  6. Hypertension.  7. History of irregular heart disease.  8. Diarrhea.   ALLERGIES:  She has no known drug allergies.   MEDICATIONS:  1. Centrum silver tablet, 1 tablet daily.  2. Lasix 120 mg daily.  3. Aspirin 325 mg daily.  4. Levothyroxine 75 mcg daily.  5. Clonidine 0.1 mg twice Michelle day.  6. Isosorbide mononitrate 60 mg daily.  7. Amlodipine and benazepril 5/20 one tablet daily.  8. Metoprolol 50 mg twice Michelle day.  9. Lipitor 40 mg daily.   SOCIAL HISTORY:  The patient lives at home with her granddaughter.  She  denied any tobacco, alcohol, or IV  drug use.   FAMILY HISTORY:  Mainly hypertension.   REVIEW OF SYSTEMS:  Fourteen-point review of systems is negative except  per HPI.   PHYSICAL EXAMINATION:  VITAL SIGNS:  Temperature is 97.6, Young pressure  121/49 with Michelle pulse of 76 dropping down into the 50s, respiratory rate  of 18, and sats 98% on room air.  GENERAL:  The patient looks acutely ill-looking, but in no acute  distress.  HEENT:  PERRL.  EOMI.  NECK:  Supple.  No JVD.  No lymphadenopathy.  RESPIRATORY:  She has good air entry bilaterally.  No wheezes.  No  rales.  CARDIOVASCULAR SYSTEM:  She has S1 and S2.  No murmur.  ABDOMEN:  Soft and nontender with positive bowel sounds.  EXTREMITIES:  No edema, cyanosis, or clubbing.   LABORATORIES:  White count is 4.5, hemoglobin 12.4, and platelet count  180 with normal differential.  Sodium 135, potassium 7.5, chloride 114,  CO2 of 17, glucose to 83, BUN 53, creatinine 2.44, and calcium 9.3.  Her  urinalysis showed moderate leukocyte esterase, WBCs 11-20, rare  bacteria.  Her EKG showed no normal findings in the beginning.  The  patient, however, did have some SVTs after ingestion of insulin and D50  glucose, but this normalized quickly.   ASSESSMENT:  This is Michelle 70 year old female with chronic diarrhea, on  multiple medications presenting with what appeared to be hyperkalemia.  She also has other medical problems including obstructive sleep apnea;  hypothyroidism; dyslipidemia; right breast cancer, status post  mastectomy; peripheral vascular disease, apparently status post left  common iliac artery stenting as well as left renal artery stenting.  She  also has coronary bypass grafting in July 09, 1996.  At this point,  the reason for her hyperkalemia is clearly unknown, because the patient  is not taking any potassium supplement, however, with her rate acute  renal failure and taking ACE inhibitors, this could be responsible.   PLAN:  1. Hyperkalemia.  We will  admit the patient.  She has already received      10 units of regular insulin with 1 L of D50 and her potassium has      dropped to 6.5.  Still, no major EKG changes.  We will put her on      scheduled Kayexalate until her potassium normalizes.  I will      continue to monitor her on tele bed.  2. Acute renal failure, most likely this is prerenal.  We will hydrate      the patient cautiously, especially with Michelle history of CHF.      Depending on how she does, we will work it out.  3. CHF.  Again, the patient's EF is currently none.  We will order Michelle 2-      D echo and we will hydrate her cautiously at this point.  4. Dyslipidemia.  We will check fasting lipid panel and continue with      her Lipitor.  Hypertension seems to be stable.  I will continue      with her home medicine, but I will hold the clonidine and ACE      inhibitor.  The ACE inhibitor will be due to her renal failure.  5. UTI.  I will put her on empiric antibiotics while awaiting      cultures.  Further treatment will depend on how the patient does      and response to this treatment in the hospital.      [name], M.D.  Electronically Signed     LG/MEDQ  D:  12/14/2008  T:  12/14/2008  Job:  [id number]

## 2010-12-04 NOTE — Discharge Summary (Signed)
[name], [name]                ACCOUNT NO.:  [id number]   MEDICAL RECORD NO.:  [id number]          PATIENT TYPE:  INP   LOCATION:  4742                         FACILITY:  [facility]   PHYSICIAN:  Hind I Elsaid, MD      DATE OF BIRTH:  1940/12/24   DATE OF ADMISSION:  12/13/2008  DATE OF DISCHARGE:  12/16/2008                               DISCHARGE SUMMARY   PRIMARY CARE PHYSICIAN:  [name], M.D.   DISCHARGE DIAGNOSES:  1. Hyperkalemia.  2. Acute renal failure, felt to be secondary to prerenal.  3. Dehydration.  4. Diarrhea, resolved.  5. Hypertension.  6. History of diastolic congestive heart failure with ejection      fraction of 55-60%.  7. History of breast cancer status post mastectomy.  8. History of dyslipidemia.  9. History of gout.  10.Peripheral vascular disease status post stenting of her left common      iliac artery and status post aneurysmal poststenotic dilatation      done on 2009.   DISCHARGE MEDICATIONS:  1. Aspirin 325 mg p.o. daily.  2. Plavix 75 mg p.o. daily.  3. Metoprolol 50 mg twice daily.  4. Levothyroxine 75 mcg p.o. daily.  5. Lipitor 40 mg daily.  6. Multivitamin 1 tab daily.  7. Amlodipine/benazepril 10/20 mg daily.  8. Clonidine 0.1 mg daily.  9. Imdur 60 mg daily.  10.Medication on hold Aldactone 25 mg daily.  11.Lasix.   FOLLOWUP:  The patient needs to follow up with her primary care  physician within 1 week.   PROCEDURE:  Ultrasound of the kidney suggest chronic medical disease.   HISTORY OF PRESENT ILLNESS:  This is a 70 year old pleasant female with  multiple medical problems.  The patient admitted from her primary care  physician after feeling weak.  She had a history of also diarrhea with  decreased p.o. intake.  She came, had blood workup, found to have  potassium of 7.5 and elevated creatinine felt to be secondary to renal  insufficiency and dehydration.  The patient admitted to telemetry,  Kayexalate started.  Hyperkalemia  completely corrected, and acute renal  failure resolved with IV fluid, which felt to be secondary to  dehydration possibility of medication.  On reviewing the patient's  medication, she has admitted and she is on Aldactone 25 mg daily.  I do  not know why this patient is on this medication.  She has 2-D echo done  at this hospital stay, which did show ejection fraction of 50-60% and no  regional wall motion abnormalities and Doppler parameter consistent with  abnormal left ventricular relaxation.  The patient's kidney function and  the patient's symptoms completely resolved and has no more diarrhea.  The patient tolerated the diet very well.  No nausea.  No vomiting.  We  will hold Aldactone and Lasix at this time.  We will continue with ACE  inhibitor, with amlodipine, and the patient need to follow up with her  primary care within 1 week for further help.  We felt the patient is  medically stable to be discharged home.  Hind Bosie Helper, MD  Electronically Signed     HIE/MEDQ  D:  12/16/2008  T:  12/17/2008  Job:  [id number]

## 2010-12-04 NOTE — Cardiovascular Report (Signed)
NAMESHARINE, [name]                ACCOUNT NO.:  [id number]   MEDICAL RECORD NO.:  [id number]          PATIENT TYPE:  OBV   LOCATION:  3729                         FACILITY:  [facility]   PHYSICIAN:  Nanetta Batty, M.D.   DATE OF BIRTH:  12-Apr-1941   DATE OF PROCEDURE:  11/10/2007  DATE OF DISCHARGE:                            CARDIAC CATHETERIZATION   HISTORY OF PRESENT ILLNESS:  [name] is a 70 year old mildly  overweight, widowed, African American female, mother of eight living  children, grandmother to 18, and great grandmother to 43, great  grandchildren, referred by Dr. Louanna Raw at __________  for  cardiovascular evaluation.   RISK FACTOR PROFILE:  Positive for 40-pack-year history of tobacco  abuse, having stopped 10 years ago at the time of her coronary bypass  grafting.  She has history hypertension and hyperlipidemia.  She has had  a left renal stent apparently.  She has a known iliac disease and right  greater than left lower extremity claudication.  A Myoview stress test  was essentially at low risk and 2-D echo was normal.  Dopplers revealed  a right ABI of 0.87 with a high-frequency signal in the right iliac.  She presents now for angiography and potential intervention.   DESCRIPTION OF PROCEDURE:  The patient was brought to the second floor  [facility] in the postabsorbed state. She was  premedicated with p.o. Valium.  The right groin was prepped and shaved  in the usual sterile fashion.  A 1% lidocaine was used for local  anesthesia.  A 5-French sheath was inserted into the right femoral  artery using standard Seldinger technique.  A 5-French tennis racquet  catheter was used for midstream in the distal abdominal aortography,  bifemoral __________  technique.  Visipaque dye was used through the  entirety of the case.  Aortic pressures monitored in the case.   ANGIOGRAPHIC RESULTS:  1. Abdominal aorta.  1a.  Renal arteries - 50% in-stent  restenosis within the left renal  artery stent.  1b.  Infrarenal abdominal aorta was dilated.  1. Left lower extremity;  2a.  80% ostial/proximal sequential left common iliac artery stenoses.  2b.  Two-vessel runoff.  1. Right lower extremity;  3a.  Complex 50-70% dilated proximal right common iliac artery stenosis  with a 40-mm pullback gradient after administration of intra-arterial  nitroglycerin.  3b.  50% segmental mid-right SFA with vessel runoff.   PROCEDURE:  A 5-French sheath was exchanged over the wire for a 7-French  bright-tip 30-cm long Cordis sheath.  The patient received 25 units of  heparin intravenously.  The iliac was dilated with a 6 x 4 Powerflex and  stented with a 12 x 4 SMART Cordis nitinol self-expanding stent.  It was  postdilated with a 9 x 4 Agiletrack balloon at 2-3 atmospheres resulting  reduction of a 50% to 70% lesion at 0% residual with excellent result.  The patient tolerated the procedure well.  __________  was measured less  than 200 and the sheath was removed.   We  achieved hemostasis.  The patient left lab  in stable condition.  She  will be treated with aspirin and Plavix, gently hydrated using bicarb  protocol per pharmacy.  Renal function will be assessed in the morning  and she will be discharged home.  The patient will follow up with  Dopplers ABIs and will be seen back in the office in followup.  She left  the lab in stable condition.      Nanetta Batty, M.D.  Electronically Signed     JB/MEDQ  D:  11/10/2007  T:  11/11/2007  Job:  [id number]

## 2010-12-04 NOTE — Discharge Summary (Signed)
[name], [name]                ACCOUNT NO.:  [id number]   MEDICAL RECORD NO.:  [id number]          PATIENT TYPE:  INP   LOCATION:  3729                         FACILITY:  [facility]   PHYSICIAN:  [name], P.A.   DATE OF BIRTH:  April 12, 1941   DATE OF ADMISSION:  11/10/2007  DATE OF DISCHARGE:  11/11/2007                               DISCHARGE SUMMARY   DISCHARGE DIAGNOSES:  1. Peripheral vascular disease, status post left common iliac artery      PTA and stenting this admission.  2. Treated dyslipidemia.  3. Sleep apnea.  4. Coronary artery bypass grafting in 1997 with patent grafts, October      2001 and again in June 2003.  5. Previous left renal artery stenting.  6. History of breast cancer status post right mastectomy in 1984.   HOSPITAL COURSE:  The patient is a 70 year old female followed by Dr.  Earlene Young.  She saw Dr. Allyson Young in the office on August 19, 2007.  She was to  be established for cardiac care.  Dr. Allyson Young ordered an echocardiogram  and Myoview study which both, he notes, were essentially normal.  She  did have preserved ABIs in her lower extremities, but she did have a  high frequency signal at the origin of the right common iliac artery and  complained of some right lower extremity claudication.  After discussion  with the patient, he decided to proceed with elective diagnostic  angiogram.  We did hold her Benicar and diuretic pre-procedure because  her creatinine was 1.5.  The patient was admitted on November 10, 2007 and  underwent peripheral angiogram which revealed an 80% ostial left common  iliac artery two-vessel runoff.  There was some spasm in the right  common iliac artery that was relieved with intra-arterial nitroglycerin.  She did have around 50% in-stent restenosis left renal artery and  dilated infrarenal aorta.  We felt the patient could be discharged on  November 11, 2007.  She has a sleep study scheduled for May 6 and will  follow up with [place] after  that.   DISCHARGE MEDICATIONS:  1. Metoprolol 50 mg b.i.d.  2. Lipitor 40 mg a day.  3. Levothyroxine 0.075 mg a day.  4. Multivitamin daily.  5. Aspirin 81 mg a day.  6. Amlodipine.  7. Benazepril 20 daily.  8. Clonidine of 0.1 mg b.i.d.  9. Imdur 60 mg a day.  10.Lasix 20 mg a day.  11.Plaquenil 200 mg a day.  12.Plavix 75 mg a day.   LABORATORY DATA:  Preoperative labs show a white count 4.8, hemoglobin  12.8, hematocrit 40.0, platelets 264, and INR 1.1.  Sodium 143,  potassium 4.4, creatinine at discharge is 1.13 with a BUN of 12.  Chest  x-ray shows no acute process.  The EKG shows sinus rhythm without acute  changes.   IMPRESSION:  1. Peripheral vascular disease with right lower extremity claudication      status post right common iliac artery PTA stenting this admission.  2. 50% in-stent restenosis of right renal artery stent.  3. Coronary artery  bypass grafting 1997 by Dr. Andrey Young x6 with a patent      graft in 2003 by Dr. Gerri Young with recent low-risk Myoview.  4. Preserved LV function.  5. Treated hypertension.  6. Treated dyslipidemia.  7. Treated hypothyroidism.  8. History of breast cancer, status post right mastectomy followed by      chemotherapy and radiation therapy in 1984.  9. Prior smoker, quit 1997.  10.Family history of coronary disease, the patient's father died at 34      of myocardiac infarction.  11.Degenerative joint disease, the patient has had previous open      reduction internal fixation of her left ankle after a fall and      fracture.   PLAN:  The patient will be discharged November 11, 2007.  She will follow  up with Dr. Allyson Young in the office.      [name], P.Memory Dance  D:  12/09/2007  T:  12/10/2007  Job:  [id number]   cc:   [name], M.D.

## 2010-12-04 NOTE — Discharge Summary (Signed)
NAMEWRETHA, [name]                ACCOUNT NO.:  [id number]   MEDICAL RECORD NO.:  [id number]          PATIENT TYPE:  OBV   LOCATION:  3703                         FACILITY:  [facility]   PHYSICIAN:  Nanetta Batty, M.D.   DATE OF BIRTH:  29-Dec-1940   DATE OF ADMISSION:  01/25/2008  DATE OF DISCHARGE:  01/26/2008                               DISCHARGE SUMMARY   [name] is a 70 year old African American female, mother of 8 living  children and grandmother to 10 grandchildren.  She was originally  referred to Dr. Allyson Sabal in January.  She was having claudication.  She  also has a history of coronary artery bypass grafting by Dr. Particia Lather in December and sleep apnea.  She had an angiogram on November 10, 2007 which revealed a high-grade bilateral iliac disease with 50% in-  stent restenosis to the left renal artery stent.  Dr. Allyson Sabal stented her  left iliac with a 12.4 a SMART stent.  She came to the hospital for  elective left common iliac intervention.  This was performed on January 25, 2008 by Dr. Allyson Sabal.  Please see his dictation for complete details.  She  underwent left CIA stenting.  She also had an aneurysm there.  The  procedure was successful.  Apparently, Dr. Jacinto Halim assisted and on the  morning of January 26, 2008, she was seen by Dr. Allyson Sabal.  Blood pressure was  130/80, her pulse was 70.  She had a left groin bruising with some  soreness to the area.  Her labs were within normal limits with a  hemoglobin of 12.5, hematocrit 37.9, WBCs 5.9, and platelets 210.  Her  sodium was 136, potassium 3.7, BUN 11, creatinine 0.97, and glucose was  101.  She was considered stable for discharge home on aspirin and  Plavix.  Her medications actually did not change.  She has been on  Plavix for approximately a month now.   DISCHARGE MEDICATIONS:  1. Metoprolol 50 mg b.i.d.  2. Lipitor 40 mg every day.  3. Levothyroxine 75 mcg a day.  4. Multivitamin every day.  5. Aspirin 325 mg a day.  6.  Amlodipine and benazepril 10/20 every day.  7. Clonidine 0.1 mg every day.  8. Imdur 60 mg every day.  9. Furosemide 20 mg every day.  10.Plaquenil 200 mg 2 every day.  11.Plavix 75 mg every day.  12.She was given a prescription for Darvocet-N 100 one to two every 6      hours as needed for pain.  She was given 20 pills.   FOLLOWUP:  She was scheduled for a followup as an outpatient with Dr.  Allyson Sabal for lower extremity Dopplers on February 10, 2008 at 1 p.m. and she  will see Dr. Allyson Sabal then on February 16, 2008 at 3:45.  She was told not to  do any lifting, pushing, pulling, or extended walking for a week and no  driving for a day.  She should increase her activity slowly and she may  take a shower today.   DISCHARGE DIAGNOSES:  1. Peripheral  vascular disease status post intervention of her left      common iliac artery with history of claudication.  2. Atherosclerotic cardiovascular disease with previous left iliac      artery stenting and left renal artery stenting.  3. Atherosclerotic cardiovascular disease with history of coronary      bypass graft, December 1997.  4. Obstructive sleep apnea.  5. Obesity.  6. Hypertension.  7. Hypothyroidism.  8. Hyperlipidemia.  9. History of breast cancer status post right mastectomy.      [name], N.P.      Nanetta Batty, M.D.  Electronically Signed    BB/MEDQ  D:  01/26/2008  T:  01/27/2008  Job:  [id number]   cc:   Gabriel Earing, M.D.

## 2010-12-04 NOTE — Cardiovascular Report (Signed)
NAMEVALENTINE, [name]                ACCOUNT NO.:  [id number]   MEDICAL RECORD NO.:  [id number]          PATIENT TYPE:  OBV   LOCATION:  3703                         FACILITY:  [facility]   PHYSICIAN:  Nanetta Batty, M.D.   DATE OF BIRTH:  07/27/1940   DATE OF PROCEDURE:  DATE OF DISCHARGE:                            CARDIAC CATHETERIZATION   [name] is a delightful 70 year old mildly overweight African  American female, mother of 8 living children, grandmother to 12  grandchildren, who was originally referred to me back in January.  She  was having claudication.  Risk factors include remote tobacco abuse,  hypertension, and hyperlipidemia.  She is approximately 12 years status  post coronary bypass grafting by Dr. Particia Lather in December 1997.  She also has sleep apnea, on CPAP, seen by Dr. Tresa Endo for this.  An  angiogram on November 10, 2007, revealed a high-grade bilateral iliac  disease with 50% in-stent restenosis within the left renal artery stent.  I stented her left iliac with a 12 x 4 Smart stent, post dilated with a  9 x 4 balloon.  She presents now for PTA and coverage stenting of her  left common iliac for left lower extremity claudication with aneurysmal  poststenotic dilatation.   PROCEDURE DESCRIPTION:  The patient was brought to the second floor  [facility] in a postabsorptive state.  She was  premedicated with p.o. Valium.  Her left groin was prepped and draped in  the usual sterile fashion.  Xylocaine 1% was used for local anesthesia.  An 8-French 3 cm long Cordis Brite-Tip sheath was inserted into the left  femoral artery using standard Seldinger technique.  The patient received  3000 units of heparin intravenously.  Dr. Jacinto Halim assisted during the  case.  Predilatation of the ostial and proximal left iliac was performed  with a 5 x 4 balloon.  Following this, abdominal aortography was  performed.  The ostium of the left iliac was calcified and  restrictive.  The vessel was dilated with a 6 x 2 balloon.  Following this, a 738  iCAST balloon expandable PTFE-covered stent was then deployed under  careful angiographic and fluoroscopic control at the origin of the left  iliac at nominal pressures and postdilated with a 8 x 4 Durata at 7  atmospheres resulting reduction of 80% calcific aneurysmal, ostial, and  proximal left common iliac artery stenosis with 0% residual with  complete covering and occlusion of the aneurysmal segment.  The patient  tolerated the procedure well.  An ACT was measured and the sheath was  removed.  Pressures on the groin to achieve hemostasis.  The patient  left the lab in stable condition.  She will be treated with aspirin and  Plavix, gently hydrated, and discharged home in the morning.  She could  follow up Dopplers and ABIs, and we will see her back in the office  after that followup.      Nanetta Batty, M.D.  Electronically Signed     JB/MEDQ  D:  01/25/2008  T:  01/25/2008  Job:  [id number]  cc:   [facility]  [facility]  Louanna Raw

## 2010-12-05 ENCOUNTER — Encounter: Payer: Self-pay | Admitting: Cardiology

## 2010-12-07 NOTE — Discharge Summary (Signed)
[name], [name]                          ACCOUNT NO.:  [id number]   MEDICAL RECORD NO.:  [id number]                   PATIENT TYPE:  INP   LOCATION:  0377                                 FACILITY:  [facility]   PHYSICIAN:  Erasmo Leventhal, M.D.         DATE OF BIRTH:  12-31-1940   DATE OF ADMISSION:  02/15/2004  DATE OF DISCHARGE:  02/20/2004                                 DISCHARGE SUMMARY   ADMISSION DIAGNOSIS:  Trimalleolar ankle fracture, left ankle.   DISCHARGE DIAGNOSIS:  Trimalleolar ankle fracture, left ankle.   OPERATION:  Open reduction/internal fixation, left ankle fracture.   BRIEF HISTORY:  This is a 70 year old lady with a history of falling out her  front steps on the morning of admission, who landed and fractured her ankle.  She was brought to the emergency room with a fracture/dislocation of her  left ankle.  In the emergency room, she was noted to have tinting of the  medial skin but no blanching or break through the skin.  Her sensation is  grossly intact.  Dorsalis pedis and posterior tibialis pulses are 2+.  Capillary refill is quick at one second.  She is subsequently admitted to  the hospital and taken to the OR for open reduction/internal fixation.  She  was scheduled for medical consult due to her multiple medical problems and  for perioperative medical management.   HOSPITAL COURSE:  The first postoperative day, vital signs are stable.  She  is afebrile.  O2 sats are 98% on three liters.  Hemoglobin and hematocrit  were stable.  Glucose elevated at 118.  Neurovascular status remained intact  in the toes.  There was moderate swelling of the toes.  Calf tenderness was  negative.  Lungs were clear.  Medical consult was obtained, and her medical  problems were managed appropriately.   The second postoperative day, she was feeling better.  Vital signs were  stable.  Temp was to 101.  Lung sounds were decreased at the bases.  Heart  sounds were normal.   Bowel sounds were sluggish.  The right and left calf  popliteal fossa regions were sore.  O2 sats were slightly decreased on room  air.  There was no shortness of breath or chest pain reported by the  patient, but due to her popliteal fossa tenderness and decreased O2 sats,  Dopplers were obtained to rule out DVT, and these were negative.   Chest x-ray was subsequently obtained as well and showed bibasilar  atelectasis.  She was subsequently continued on her medical management,  increased incentive spirometry, and slowly her temperature cleared and lung  sounds became clear.   On February 20, 2004 in improved with vital signs stable, temperature of 99,  swelling decreased, tenderness in the calf gone, lungs clear, bowel sounds  active, and walking well in physical therapy, the patient was subsequently  discharged home for followup in the office in 10  days.   CONDITION ON DISCHARGE:  Improved.   DISCHARGE MEDICATIONS:  1. Percocet 1-2 q.6h. p.r.n. pain.  2. Robaxin 500 1 p.o. q.h.s. p.r.n. spasm.  3. She will resume her once daily aspirin.   She is instructed in calf pump exercises.  Medical followup per the medicine  service.  Again, follow up in our office in 10 days.   DISCHARGE INSTRUCTIONS:  She is to put no weight on her fracture side.  She  is to use her crutches or walker and call if any problems or questions  arise.     Jaquelyn Bitter. Chabon, P.A.                   Erasmo Leventhal, M.D.    SJC/MEDQ  D:  02/28/2004  T:  02/28/2004  Job:  [id number]

## 2010-12-07 NOTE — Op Note (Signed)
NAMESUNDAE, [name]                ACCOUNT NO.:  [id number]   MEDICAL RECORD NO.:  [id number]          PATIENT TYPE:  INP   LOCATION:  5029                         FACILITY:  [facility]   PHYSICIAN:  Madelynn Done, MD  DATE OF BIRTH:  [birth year]   DATE OF PROCEDURE:  06/21/2006  DATE OF DISCHARGE:                               OPERATIVE REPORT   PREOPERATIVE DIAGNOSES:  1. Right elbow fracture-dislocation, terrible triad injury with      fracture and dislocation of the radial head, fracture of the      proximal ulna and coronoid, and disruption of the lateral ulnar      collateral ligament.  2. Obesity.  3. Coronary artery disease.  4. Hypertension.   POSTOPERATIVE DIAGNOSES:  1. Right elbow fracture-dislocation, terrible triad injury with      fracture and dislocation of the radial head, fracture of the      proximal ulna and coronoid, and disruption of the lateral ulnar      collateral ligament.  2. Obesity.  3. Coronary artery disease.  4. Hypertension.   PROCEDURES:  1. Open treatment of right radial head fracture and dislocation with      prosthesis.  2. Open treatment of right proximal ulna fracture with internal      fixation.  3. Repair of right lateral ulnar collateral ligament with local      tissue, right elbow.  4. Stress radiographs, right elbow.   ATTENDING SURGEON:  Madelynn Done, MD, who was scrubbed and present  for the entire procedure.   ASSISTANT SURGEON:  Dionne Ano. Amanda Pea, M.D.   IMPLANTS USED:  AcuMed radial head prosthesis, 24 head, +2, with a 7-mm  stem; olecranon plating system, AcuMed, with an olecranon plate and a  small medial plate along the radioulnar side, serving as a buttress  plate, 3-hole plate; one 3.0 Suture Tak anchor for repair of the lateral  ulnar collateral ligament.   ANESTHESIA:  General endotracheal anesthesia.   DRAINS:  None.   TOURNIQUET TIME:  120 minutes at 250 mmHg.   SURGICAL INDICATIONS:  [name] is a  70 year old, right-hand-dominant  female, who sustained a closed injury to her right elbow after falling  down some stairs.  General orthopedics was consulted, and then the hand  service was consulted for further management of the complex elbow  fracture-dislocation.  A preoperative CT scan was reviewed for  preoperative planning, which showed a fracture-dislocation of the radial  head, a proximal ulna fracture, as well as disruption of the lateral  ulnar collateral ligament.  The risks, benefits and alternatives were  discussed with the patient and then signed informed consent was  obtained.  The patient received preoperative cardiac clearance with a  history of coronary artery bypass surgery.  The risks included, but were  not limited to bleeding, infection, nerve damage, elbow stiffness  instability of the elbow, need for further surgery, heterotopic  ossification.  A signed informed consent was obtained.   INTRAOPERATIVE FINDINGS:  The patient did have a comminuted radial head  fracture that was  not amenable to internal fixation.  She had complete  disruption of the lateral ulnar collateral ligament, as well as a  complex fracture of the proximal ulna.   DESCRIPTION OF PROCEDURES:  The patient was properly identified in the  preoperative holding area and a mark with a permanent marker was made on  the right upper extremity indicating the correct operative site.  The  patient was then brought back to the operating room and placed supine on  the operating room table, where general anesthesia was administered.  She tolerated the procedure well.  The patient received 1 g IV Ancef  prior to any skin incisions.  The patient also had 1 g IV Ancef running  through her IV fluids during the case.  The patient was placed on a  beanbag initially, and then after anesthesia was induced, the patient  was then placed in semilateral position with an axillary roll and all  pressure points well  padded.  A Foley catheter was placed prior to the  beginning of the case.  She wore SCDs and TED hose throughout the  duration of the case.  The right upper extremity was then sealed with  two 1000 drapes, and then prepped with Betadine and then sterilely  draped.  A standard posterior incision curving slightly around the  medial olecranon was then made.  Dissection was carried down through the  skin and subcutaneous tissues.  Large subcutaneous flaps were then  raised laterally.  It was not raised medially.  The radial head  dislocation and fracture had disrupted a portion of the lateral mobile  wad tissue, as well as disruption of the lateral ulnar collateral  ligament, and most of the dissection was already completed, given the  fracture pattern.  The proximal radius was then identified and there  were several small fragments of the radial head and it was felt not  amenable to internal fixation.  With the radial head identified, using a  small oscillating saw, the radial head was then resected as close to the  surgical neck as possible.  Using the 5-mm awl, the canal was then  entered, and then broaching was increased to 7 mm, allowing for a good  fit within the canal.  The collar reamer was then used to maintain a  good plane of the resection.  The head diameter was then sized and noted  to be a 24-mm head.  The head and stem gauge were then assembled to  appropriately size the implant.  The collar height was then determined,  and it was decided to use a +2 collar height, which allowed for  stability, as well as good range of motion.  The trial implants were  then assembled, using a 24-mm head and a +2 collar.  The trial implants  were then inserted and the elbow placed through a range of motion, with  good articulation in the capitellum.  The implant was then assembled, and then the implant was inserted, making sure that the radial head was  aligned with the lateral aspect of the  radius with the forearm in a  neutral position.  After placement of the radial head, the elbow was  placed through a range of motion and felt to have relatively good  stability, knowing that the lateral ulnar collateral ligament needed to  be repaired as well.   Attention was then turned to fixation of the proximal ulna.  The patient  did have a comminuted proximal ulna fracture.  There was a small  coronoid piece anteriorly.  Open reduction was then performed of the  comminuted proximal ulna fracture.  The AcuMed proximal ulnar plate was  then applied, and then reduction maintained.  To the proximal ulnar  pieces, there were 2 large fragments, both medially and radially, which  were held in place, and, actually, through drill holes, #2 FiberWire was  then placed through these 2 fragments in order to hold these pieces to  the AcuMed plate.  With provisional reduction confirmed, the screws were  then placed distally in a compression mode, and then several screws were  placed proximally to obtain reduction.  After placement of all screws,  it was still felt needed to buttress the fragment along the radial side  of the ulna, and a small 3-hole buttress plate with 1 bicortical screw  distally and 1 cancellous screw proximally was then used to buttress the  fragment along the radial side.  After fixation of the proximal ulna,  the screw lengths were then confirmed using the mini-C-arm and felt to  be of adequate length and position.  The elbow was then placed through a  range of motion, and there was really no significant instability with  full flexion, and able to get full extension without any instability.  Therefore, it was not felt it was necessary to go after the small  coronoid fragment anteriorly.  After function of the proximal ulna, the  attention was then turned to repairing the lateral ulnar collateral  ligament.  A 3.0 Suture Tak anchor was then placed over the insertion  over the  lateral aspect of the distal humerus.  Portions of the lateral  ulnar collateral ligament were then repaired directly down to the bone  with the FiberWire attached to the suture anchor.  Further reinforcement  was then used of the extensor musculature reinforced over the lateral  ulnar collateral ligament in order to reinforce the lateral ulnar  collateral ligament repair.  This was done with the FiberWire suture.  After repair of the lateral ulnar collateral ligament, the elbow was  then placed through a range of motion and felt to be with good stability  with elbow flexion and extension, as well as pronation and supination.  This was confirmed using the mini-C-arm under stress examination and  live fluoro.  There did not appear to be any incongruency or instability  on putting the elbow through a good range of motion.  Following completion of repair of the lateral ulnar collateral ligament,  the wound was then thoroughly irrigated with a saline solution.  The  overlying fascia over the olecranon plate was then closed with a 0  Vicryl suture.  The subcutaneous tissues were then closed with 2-0  Vicryl suture, and the skin was then closed with skin staples.  Marcaine  0.5%  20 cc was then injected over the incision site.  A Xeroform  dressing and a sterile compressive dressing were then applied.  The  patient was then placed in a well-padded and well-molded splint, keeping  the forearm in pronation to protect the repair of the lateral ulnar  collateral ligament.   Final mini-C-arm images were obtained prior to application of the  splint, which confirmed reduction of the radial capitellar joint, as  well as good reduction of the proximal ulna with the implants in place  and adequate screw lengths.   The patient was then extubated and taken to the recovery room in good  condition.  POSTOPERATIVE PLAN:  The patient will be admitted for postoperative pain  control and IV antibiotics.   We will then arrange home health and follow  her back up in the office in about 10 days.  The patient will then  hopefully get into a monitored program with therapy, keep working on  gentle motion of the elbow with the forearm in pronation for the first 3  to 4 weeks to protect the repair, and then gradually increase in range  of motion, active motion, with evidence of radiographic healing.  The  patient's questions were answered.  The surgeon, Dr. Bradly Bienenstock, was  scrubbed and present for the entire procedure.      Madelynn Done, MD  Electronically Signed     FWO/MEDQ  D:  06/23/2006  T:  06/23/2006  Job:  [id number]   cc:   Dionne Ano. Everlene Other, M.D.

## 2010-12-07 NOTE — Consult Note (Signed)
NAMEROWENA, MOILANEN                ACCOUNT NO.:  [id number]   MEDICAL RECORD NO.:  [id number]          PATIENT TYPE:  INP   LOCATION:  5029                         FACILITY:  [facility]   PHYSICIAN:  Pricilla Riffle, MD, FACCDATE OF BIRTH:  01-25-1941   DATE OF CONSULTATION:  06/20/2006  DATE OF DISCHARGE:                                 CONSULTATION   INDICATIONS:  The patient is a 70 year old we were asked to see  regarding preoperative risk stratification.   HISTORY OF PRESENT ILLNESS:  The patient has a history of coronary  artery disease status post CABG in 1997 (LIMA to LAD/diagonal, SVG to  PDA/PLSA #1 and PLSA #2, also separate origin to OM).  Last cardiac  catheterization in 2003 showed patent grafts.  Occluded circumflex and  RCA, LAD with 80% lesions in the LAD and diagonal.  LV EF normal.   Since last catheterization, the patient has had chronic dyspnea with  exertion.  Notes no real change over the past 5 years.  Has fatigue, is  moving at about the same pace which is not very fast.  Can walk about 25  yards before giving out.  She was up on her feet at work during the day.  Again no change in her ability to do things.  Actually before bypass,  she did not have chest pain.  She felt a slippery, sliding, swooshing in  her chest and had a stress test just prior.   Currently, she is admitted with a right ulnar, radial head fracture.  Plan for repair.   ALLERGIES:  None.   MEDICATIONS:  1. Centrum multivitamin.  2. Lasix 60 daily.  3. Aspirin 325 daily.  4. Synthroid 75 mcg daily.  5. Clonidine 0.1 b.i.d. (the patient takes at night is 0.2).  6. Imdur 60 daily.  7. Lotrel 20/5 daily.  8. Lopressor 50 b.i.d. (the patient takes a 100 daily at night).  9. Lipitor, question dose.   PAST MEDICAL HISTORY:  1. CAD as noted.  2. Peripheral vascular disease, 90-95% left iliac.  3. History of right renal stenosis status post stenting 10 years ago.  4. Hypertension.  5. Breast  CA status post right mastectomy with chemotherapy, radiation      therapy.  6. Status post left ankle fracture.  7. Hypothyroidism.  8. Dyslipidemia.   SOCIAL HISTORY:  The patient lives in [place].  Works in the [employer]  Does not smoke, quit 1997  after many years; does not drink.   FAMILY HISTORY:  Mother died at age 3 of a brain aneurysm.  Father died  at age 37 of a MI.   REVIEW OF SYSTEMS:  All systems reviewed.  Occasional lower extremity  edema, chronic two-pillow orthopnea.  Occasional right arm pain.  Otherwise all systems negative to the above problem except as noted.   PHYSICAL EXAMINATION:  The patient is in no distress.  Does note some  arm pain.  Blood pressure 107/51, pulse is 40s to 50s sinus brady,  temperature is 96.3, O2 sat on room air  is 98%.  HEENT:  Normocephalic, atraumatic.  PERRL.  NECK:  JVP is normal.  No bruits.  LUNGS:  Relatively clear.  CARDIAC EXAM:  Distant heart sounds.  Regular rate and rhythm, S1-S2.  No S3-S4,  murmurs.  ABDOMEN:  Supple, benign.  EXTREMITIES:  Right upper extremity in sling, 1-2+ dorsalis pedis  pulses.  No edema.  Feet warm.   Chest x-ray portable pulmonary venous hypertension.  A 12-lead EKG sinus  bradycardia 52 beats per minute.   Labs significant for hemoglobin 11.5, WBC of 6, BUN and creatinine of 16  and 1, potassium 4.9.   IMPRESSION:  54. A 70 year old with history of coronary artery disease status post      coronary artery bypass graft as noted.  Last cardiac      catheterization in 2003 as noted.  Also peripheral vascular disease      now with left upper extremity fracture, chronic dyspnea on exertion      is unchanged.  No chest pain, no congestive heart failure.  EKG      sinus rhythm, brady probably secondary to the medications.  The      patient at some increased risk for cardiac complication but feel      low enough to proceed with surgery without further  testing.      Continue beta blocker, watch blood pressure, hold the Clonidine for      now with heart rates.  Check lipids and check thyroid.  Continue      ASA.  2. Dyslipidemia:  Will follow.  3. Hypertension as noted:  Again, we will need to follow.  4. Hypothyroidism:  Check TSH.      Pricilla Riffle, MD, [facility]  Electronically Signed     PVR/MEDQ  D:  06/20/2006  T:  06/21/2006  Job:  [id number]

## 2010-12-07 NOTE — Op Note (Signed)
[name], [name]                          ACCOUNT NO.:  [id number]   MEDICAL RECORD NO.:  [id number]                   PATIENT TYPE:  INP   LOCATION:  0101                                 FACILITY:  [facility]   PHYSICIAN:  Erasmo Leventhal, M.D.         DATE OF BIRTH:  1940/12/03   DATE OF PROCEDURE:  02/15/2004  DATE OF DISCHARGE:                                 OPERATIVE REPORT   [name] is a 70 year old female who slipped and fell today and sustained  a left ankle fracture dislocation. She was brought to the [facility]  Emergency Room, evaluated by Donnetta Hutching, M.D. and I was asked to see her due  to being an emergency room unassigned call.  I evaluated the patient in the  emergency room where she was accompanied by her granddaughter and her  daughter. She was found to have an obvious ankle fracture dislocation with a  large medial abrasion.  Treatment options discussed with the patient and  family in detail. Will discuss surgical intervention versus nonsurgical but  they chose surgery and understand all the multiple risks and benefits and  wish to proceed.   PREOPERATIVE DIAGNOSES:  Left ankle trimalleolar ankle fracture dislocation  with medial skin lesion, abrasion.   POSTOPERATIVE DIAGNOSES:  Left ankle trimalleolar ankle fracture dislocation  with medial skin lesion, abrasion.   PROCEDURE:  1. Closed reduction of dislocation of left ankle.  2. Open reduction and internal fixation of medial and lateral malleolus with     indirect reduction of posterior malleolus.  3. Excision of medial skin abrasion, lesion 1.5 cm x 8 mm.  4. Stress radiography.   ANESTHESIA:  General endotracheal.   SURGEON:  Erasmo Leventhal, M.D.   ASSISTANT:  Jaquelyn Bitter. Chabon, P.A.   ESTIMATED BLOOD LOSS:  Less than 20 mL.   DRAINS:  None.   TOURNIQUET TIME:  60 minutes at 375 mmHg.   DISPOSITION:  PACU stable.   COMPLICATIONS:  None.   SURGEON:  Erasmo Leventhal,  M.D.   ASSISTANT:  Jaquelyn Bitter. Chabon, P.A.   DESCRIPTION OF PROCEDURE:  The patient and family had been counseled. She  was taken to the operating room. Anesthesia was administered.  Following  this, she underwent a gentle closed reduction with a palpable visual and  audible reduction confirmed by C-arm. At this time, the ankle dislocation  had been reduced and fractures were in satisfactory position but obviously  in need of internal fixation. Following this, she was then elevated, prepped  with Duraprep and all draped in a sterile fashion. Exsanguinated with  esmarch, tourniquet was inflated to 375 mmHg.   __________.  Preoperatively 1 g of Ancef had been given prior to tourniquet  inflation.   A straight lateral incision was made through the skin and subcutaneous  tissues, small veins allowed to coagulated, cutaneous nerves were protected.  The area of traumatic injury was then opened  up bluntly. Sharp dissection  was then taken from the distal fibula.  She had a comminuted Weber type B  distal fibular fracture with no evidence of syndesmosis disruption.  The  fracture was then opened and irrigated thoroughly and reduced as  anatomically as possible. She also had very soft bone. At this time, an  eight hole 1/3 tubular plate was applied on the posterior aspect of fibula  and then securely fixed both proximal and distal with appropriate size 3.5  and 4.0 cancellous screws. At this appropriate level, this gave a nice a  fixation, the fracture was out to length, rotation was set with excellent  fixation. Unfortunately due to the softness of her bone and the comminution,  there was no area that I could put what I call satisfactory interfragmentary  screw.  The wounds were copiously irrigated, C-arm was utilized to confirm  excellent reduction and alignment of the fractures and implants.   Attention was direct to the medial side. She had a 1.5 x 8 mm skin lesion on  the medial side  which was directly over the necessary site for surgical  incision. At this time, I felt that it would be better to excise this area.  Based upon that, the skin was incised longitudinally and this abrasion,  lesion was ellipsed.  The fracture was then opened, soft tissue was found to  be interdigitated, it was opened thoroughly, joint was irrigated of any type  of debris, it was reduced anatomically and then securely fixed with two 4.0  cancellous cannulated screws over the soft tissue washers. At this time, we  checked with the C-arm, we had excellent reduction of the fractures and  excellent placement of the implants.  The posterior malleolus was also well  reduced indirectly by fixation of the distal fibula.  All wounds were  copiously irrigated. On the medial side, the subcutaneous tissue closed  Vicryl, skin closed with staples. Also the skin was nice and pliable at this  time and did not appear to be any excessive swelling.  On the lateral side,  the periosteum and soft tissue was closed over the distal fibula with  Vicryl, subcu Vicryl, skin closed with staples. 10 mL of 0.25% Marcaine was  placed in the wound edges for postop pain control. A sterile compressive  dressing applied, placed in plaster splints.  She was then awakened without  complications or problems. She had a normal circulation in the foot and  ankle at the end of the case. There were no complications, sponge and needle  count were correct.   To decrease surgical time and help throughout this entire procedure, Mr.  Brett Canales Chabon's assistance was needed.                                               Erasmo Leventhal, M.D.    RAC/MEDQ  D:  02/15/2004  T:  02/15/2004  Job:  [id number]

## 2010-12-07 NOTE — Discharge Summary (Signed)
NAMEARBELL, WYCOFF                ACCOUNT NO.:  [id number]   MEDICAL RECORD NO.:  [id number]          PATIENT TYPE:  INP   LOCATION:  5029                         FACILITY:  [facility]   PHYSICIAN:  Madelynn Done, MD  DATE OF BIRTH:  [birth year]   DATE OF ADMISSION:  06/24/2006  DATE OF DISCHARGE:                               DISCHARGE SUMMARY   DISCHARGE DIAGNOSES:  1. Right elbow radial head fracture/dislocation, a proximal ulnar      fracture, and disruption in the lateral ulnar collateral ligament.  2. Obesity.  3. Coronary artery disease.  4. Hypertension.   PROCEDURES AND DATES:  Right elbow open reduction and internal fixation  on June 21, 2006.   DISCHARGE MEDICATIONS:  1. Percocet 1-2 tablets p.o. q.4-6 hours as needed for pain.  2. Colace 100 mg p.o. b.i.d.  3. Resume home medications and doses.   CONSULTING PHYSICIAN:  Dr. Lucina Mellow of [facility].   REASON FOR THIS ADMISSION:  [name] is a 70 year old right-hand-  dominant female who on the morning of June 20, 2006, was seen to  fall down her stairs and injured her right elbow.  The patient was seen  and evaluated in the emergency department and noted to have a fracture  dislocation of her right elbow.  A preoperative CT scan was done to help  for preoperative surgical planning.  Patient did have a history of  coronary artery bypass graft approximately 7 years ago and the patient  was admitted and then evaluated by [facility] for preoperative  medical risk stratification and preop clearance.  After cardiac  evaluation the patient was at low risk to proceed with surgery and  surgery was scheduled on the morning of June 21, 2006.  The patient  was admitted for pain control and for preop surgery on her right elbow.   HOSPITAL COURSE:  Patient was admitted to the orthopedic floor following  the above procedure.  She tolerated the procedure well under general  anesthesia.  There were no  intraoperative complications.  Postoperatively, she was begun on IV pain medication and PT and OT were  consulted.  Throughout her hospital course she remained afebrile.  Her  vital signs:  She was a little bit hypertensive and a little bit  tachycardic postoperatively.  She was seen again by the cardiology  service and her clonidine was restarted.  Her pressure and her heart  rate improved.  She had postoperative labs studied that were drawn in  which her hemoglobin remained stable.  She did have a slightly low  potassium and this was replaced with oral potassium.  On postoperative  day number 2 her pain was controlled on oral pain medications, she was  getting out of bed to the restroom with assistance.  The patient was  seen and examined on postoperative day number 3, she was afebrile, her  vital signs were normal, she was tolerating a regular diet, her pain was  controlled on oral pain medications and the appropriate measures have  been setup for her to be discharged to home.  RECOMMENDATIONS AND DISPOSITION:  The patient is to be discharged home  with home health, [name].  Assistive devices are to be used as needed.  She is nonweightbearing in the right upper extremity.  She is to keep  her splint on at all times and not remove it.  She is to come back to  see me in the office on June 27, 2006, for her first postop check.  She is to continue with the above discharge medications.  She is to then  setup a followup appointment to see the cardiologist as an outpatient as  well.  She is instructed to return to the clinic sooner if she has  worsening pain, fevers, chills, nausea or vomiting, any problems with  her right elbow.  Prior to discharge the questions are answered.  She  voiced understanding of her discharge instructions.      Madelynn Done, MD  Electronically Signed     FWO/MEDQ  D:  06/24/2006  T:  06/24/2006  Job:  [id number]

## 2010-12-07 NOTE — Cardiovascular Report (Signed)
[place]. [facility]  Patient:    [name], [name]                         MRN: [id number] Proc. Date: 05/15/00 Adm. Date:  [id number] Attending:  Daisey Must CC:         Corwin Levins, M.D. [facility]  Cath Lab   Cardiac Catheterization  PROCEDURE:  Left heart catheterization with coronary angiography and left ventriculography.  INDICATIONS:  [name] is a 70 year old woman who is status post six vessel coronary artery bypass grafting by Dr. Andrey Campanile in 1997.  She has been having some atypical right-sided chest pain.  A stress Cardiolite done in the office was interpreted as revealing mild inferolateral and basilar ischemia.  It was therefore opted to proceed with cardiac catheterization.  DESCRIPTION OF PROCEDURE:  A 6 French sheath was placed in the right femoral artery.  Because of disease of the proximal right iliac artery, a Wooley wire was required and this was successfully advanced retrograde into the aorta. Catheters utilized for diagnostic catheterization included a 6 [place] JR4, 6 [place] JL4, and 6 [place] internal mammary, and an angled pigtail.  Contrast was Omnipaque.  There were no complications.  RESULTS:  HEMODYNAMICS:  Left ventricular pressure 134/25, aortic pressure 138/74. There was no aortic valve gradient.  LEFT VENTRICULOGRAM:  Wall motion is normal.  Ejection fraction estimated at greater than 60%.  There is some mitral regurgitation which appears to be secondary to ventricular ectope.  ABDOMINAL AORTOGRAM:  There is moderate diffuse disease at the infrarenal abdominal aorta.  The right renal artery has a 30% stenosis at its origin. The left renal artery has a stent in the proximal portion of the vessel.  In the proximal portion of the stent there is a 30% stenosis.  The right common iliac artery has approximately 25 to 30% stenosis proximally.  The left common iliac artery has a 70% stenosis at its origin.  CORONARY  ARTERIOGRAPHY:  (Right dominant).  Left main has a 50% stenosis at its origin followed by tubular 40% stenosis.  Left anterior descending artery has a tubular 40% stenosis in the proximal vessel.  The midvessel has an 80% stenosis across the origin of the large first diagonal branch.  Slightly further down in the midvessel is a second 80% stenosis.  The first diagonal itself has an 80% stenosis at its origin.  Left circumflex has a 30% stenosis in the midvessel.  There is a normal size first marginal branch which is 100% occluded and fills via vein graft.  The distal circumflex gives rise to a small OM2 and small OM3.  Right coronary artery is a large dominant vessel.  There is a tubular 70% stenosis in the proximal vessel.  The midvessel has a 99% stenosis followed by 90% stenosis.  Following the 90% stenosis is a tubular 80% stenosis extending into the distal vessel.  The distal right coronary artery gives rise to a large posterior descending artery and two large posterolateral branches.  All of these fill via saphenuos vein graft.  There is a sequential saphenuos vein graft to the posterior descending artery and two posterolateral branches.  There is a 25% stenosis in the midbody of this graft.  Otherwise it is widely patent with good filling of the distal right coronary vessels.  There is a Y-graft arising from the proximal portion of the vein graft to the posterior descending artery.  This Y-graft is anastomosed  to the first obtuse marginal branch.  This graft is also patent. In the first obtuse marginal just after the graft insertion is a 30% stenosis.   Left internal mammary artery is anastomosed to the first diagonal and then to the distal LAD.  This graft is widely patent filling a large first diagonal and a normal size distal left anterior descending artery.  IMPRESSION: 1. Normal left ventricular systolic function. 2. Moderate peripheral vascular disease involving the  left greater than the    right iliac arteries.  There is a patent stent in the left renal artery    with approximately 30% area of stenosis within the proximal stent. 3. Native three vessel coronary artery disease. 4. Status post coronary artery bypass grafting x 6 with all grafts patent.  In summary, the patient appears to be adequately revascularized with all major branches receiving adequate blood supply.  PLAN:  For continued medical therapy. DD:  05/15/00 TD:  05/15/00 Job: [id number]

## 2010-12-07 NOTE — Cardiovascular Report (Signed)
[place]. [facility]  Patient:    [name], [name] Visit Number: [id number] MRN: [id number]          Service Type: CAT Location: [facility] [id number] Attending Physician:  Daisey Must Dictated by:   Rollene Rotunda, M.D. [facility] Proc. Date: 01/07/02 Admit Date:  01/07/2002 Discharge Date: 01/07/2002   CC:         Oliver Barre, M.D.  Daisey Must, M.D. [facility]   Cardiac Catheterization  DATE OF BIRTH: December 30, 1940  PRIMARY PHYSICIAN: Oliver Barre, M.D.  CARDIOLOGIST: Daisey Must, M.D.  PROCEDURE: Left heart cardiac catheterization/coronary arteriography.  INDICATIONS: Evaluate patient with chest pain and a Cardiolite suggesting anteroapical ischemia.  The patient had previous CABG.  DESCRIPTION OF PROCEDURE: Left heart catheterization performed via the right femoral artery.  The artery was cannulated using an anterior wall puncture.  A #6 French arterial sheath was inserted via the modified Seldinger technique. Preformed Judkins and a pigtail catheter were utilized.  We used wire exchanges because of some difficulty initially passing the guide wire. The patient tolerated the procedure well and left the lab in stable condition.  RESULTS:  HEMODYNAMICS: LV 206/31, AO 201/96.  CORONARY ARTERIOGRAPHY: Left main had luminal irregularities.  The LAD had long 40% stenosis. There is a mid 80% stenosis before a large mid diagonal and 80% stenosis after the mid diagonal. The mid diagonal had proximaL 80% stenosis.  The circumflex had luminal irregularities in the AV groove. A moderate sized mid obtuse marginal was occluded at the ostium.  The right coronary artery was occluded in the mid segment. There was distal 90% stenosis between the PDA and two posterolateral branches.  GRAFTS: A LIMA to the LAD and sequential to diagonal was patent. Saphenous vein graft to the PDA sequential to posterolateral #1 and posterolateral #2 was patent. A saphenous vein graft  originating from the body of the above-mentioned saphenous vein graft was anastomosed to the OM and was patent.  LEFT VENTRICULOGRAM: The left ventriculogram was obtained in the RAO projection.  The EF was 60% with well preserved wall motion.  DISTAL AORTOGRAM: A distal aortogram was obtained secondary to previous documented peripheral vascular disease and her previous stenting of her left renal. She had diffuse distal aortic disease. She had moderate nonobstructive plaquing of the right iliac. She had a 90-95% stenosis of the left proximal iliac. There was 40% ostial right renal artery stenosis. There was a stent in the left renal artery with 30% stenosis.  CONCLUSIONS: Severe three-vessel coronary artery disease. Patent bypass grafts. Severe diffuse aortic disease.  PLAN: The patient will continue to have aggressive secondary risk factor modification. Dictated by:   Rollene Rotunda, M.D. [facility] Attending Physician:  Daisey Must DD:  01/07/02 TD:  01/09/02 Job: [id number]

## 2010-12-07 NOTE — H&P (Signed)
NAMEADELAE, YODICE                          ACCOUNT NO.:  [id number]   MEDICAL RECORD NO.:  [id number]                   PATIENT TYPE:  INP   LOCATION:  0101                                 FACILITY:  [facility]   PHYSICIAN:  Erasmo Leventhal, M.D.         DATE OF BIRTH:  [birth year]   DATE OF ADMISSION:  02/15/2004  DATE OF DISCHARGE:                                HISTORY & PHYSICAL   CHIEF COMPLAINT:  Left ankle fracture dislocation.   HISTORY OF PRESENT ILLNESS:  This is sa 70 year old lady with a history of  going down the front steps this morning when she fell.  She landed, twisted  her ankle, felt pain, looked down and saw a deformity.  911 was called.  She  was brought to the hospital by ambulance with a fracture dislocation of her  left ankle.  In the emergency room she was noted to have tenting of the  medial skin, but no blanching.  Her sensation was grossly intact.  Her  dorsalis pedis and posterior tibialis pulses are 2+ and her capillary refill  was quick at 1 second.  X-rays documented a fracture dislocation of her left  ankle and after discussion of treatment options patient is now to go to the  OR for closed reduction and then open reduction internal fixation of her  fracture dislocation.  Surgery risks, benefits, and after care were  discussed with the patient and her daughter.  Questions were invited and  answered.   ALLERGIES:  None.   CURRENT MEDICATIONS:  1. Unithroid 75 mcg one daily.  2. Isosorbide MN 30 mg two q.p.m.  3. Lasix 20 mg one daily.  4. Lipitor 40 mg q.h.s.  5. Clonidine 0.1 mg one b.i.d.  6. Metoprolol 50 mg one b.i.d.  7. Actonel one q.Saturday.  8. Ecotrin one daily.   PAST SURGICAL HISTORY:  1. CABG.  2. Renal artery stent.  3. Mastectomy.   PAST MEDICAL HISTORY:  1. Coronary artery disease.  2. Peripheral vascular disease.  3. Hypertension.  4. Hypercholesterolemia.  5. Osteoporosis.  6. Breast cancer.   FAMILY HISTORY:   Positive for hypertension and coronary artery disease.   SOCIAL HISTORY:  The patient is widowed.  She is retired.  She lives at  home.  She does not smoke or drink.   REVIEW OF SYSTEMS:  CENTRAL NERVOUS SYSTEM:  Positive for occasional frontal  headache, otherwise negative.  PULMONARY:  Positive for exertional shortness  of breath and two pillow orthopnea.  Negative for PND.  CARDIOVASCULAR:  Positive for history of CABG with coronary artery disease.  No anginal  symptoms or chest pain currently.  GASTROINTESTINAL:  Negative for ulcers,  hepatitis.  GENITOURINARY:  Negative for urinary tract difficulty.  MUSCULOSKELETAL:  Positive in HPI.   PHYSICAL EXAMINATION:  VITAL SIGNS:  Blood pressure 155/83, pulse 68 and  regular, respirations 20.  HEENT:  Head normocephalic.  Nose  patent.  Ears patent.  Pupils are equal,  round, and reactive to light.  Throat without injection.  NECK:  Supple without adenopathy.  Carotids 2+ without bruit.  CHEST:  Clear to auscultation.  No rales or rhonchi.  Respirations 20.  HEART:  Regular rate and rhythm at 68 beats per minute without murmur.  ABDOMEN:  Soft with active bowel sounds.  No masses or organomegaly.  NEUROLOGIC:  The patient alert and oriented to time, place, and person.  Cranial nerves II-XII grossly intact.  EXTREMITIES:  Upper extremities have full range of motion of the shoulder,  elbows, wrists, fingers.  Sensation and circulation are grossly intact.  Cervical range of motion is full and pain-free.  The head is atraumatic.  She has no pain to rib anterior or lateral compression.  No pain to AP or  lateral pelvic compression.  She has no pain to hip range of motion or knee  range of motion.  Her right ankle is normal and sensation and circulation  are intact.  Her left ankle shows fracture dislocation with intact dorsalis  pedis/posterior tibialis pulse and sensation is grossly intact.   LABORATORIES:  X-rays show fracture dislocation of  the left ankle.   ASSESSMENT:  Fracture dislocation left ankle.   PLAN:  Open reduction internal fixation left ankle.     Jaquelyn Bitter. Chabon, P.A.                   Erasmo Leventhal, M.D.    SJC/MEDQ  D:  02/15/2004  T:  02/15/2004  Job:  [id number]

## 2010-12-10 ENCOUNTER — Ambulatory Visit (INDEPENDENT_AMBULATORY_CARE_PROVIDER_SITE_OTHER): Payer: Medicare Other | Admitting: Cardiology

## 2010-12-10 ENCOUNTER — Encounter: Payer: Self-pay | Admitting: Cardiology

## 2010-12-10 VITALS — BP 130/66 | HR 57 | Ht 65.0 in | Wt 219.4 lb

## 2010-12-10 DIAGNOSIS — E785 Hyperlipidemia, unspecified: Secondary | ICD-10-CM

## 2010-12-10 DIAGNOSIS — I2510 Atherosclerotic heart disease of native coronary artery without angina pectoris: Secondary | ICD-10-CM

## 2010-12-10 DIAGNOSIS — I25810 Atherosclerosis of coronary artery bypass graft(s) without angina pectoris: Secondary | ICD-10-CM

## 2010-12-10 DIAGNOSIS — I10 Essential (primary) hypertension: Secondary | ICD-10-CM

## 2010-12-10 LAB — BASIC METABOLIC PANEL
BUN: 13 mg/dL (ref 6–23)
CO2: 25 mEq/L (ref 19–32)
Calcium: 9.2 mg/dL (ref 8.4–10.5)
Chloride: 108 mEq/L (ref 96–112)
Creatinine, Ser: 1.1 mg/dL (ref 0.4–1.2)
GFR: 63.01 mL/min (ref >60.00)
Glucose, Bld: 81 mg/dL (ref 70–99)
Potassium: 4.2 mEq/L (ref 3.5–5.1)
Sodium: 140 mEq/L (ref 135–145)

## 2010-12-10 LAB — HEPATIC FUNCTION PANEL
ALT: 22 U/L (ref 0–35)
AST: 22 U/L (ref 0–37)
Albumin: 3.6 g/dL (ref 3.5–5.2)
Alkaline Phosphatase: 97 U/L (ref 39–117)
Bilirubin, Direct: 0.1 mg/dL (ref 0.0–0.3)
Total Bilirubin: 0.8 mg/dL (ref 0.3–1.2)
Total Protein: 6.4 g/dL (ref 6.0–8.3)

## 2010-12-10 LAB — LIPID PANEL
Cholesterol: 107 mg/dL (ref 0–200)
HDL: 37.4 mg/dL — ABNORMAL LOW (ref >39.00)
LDL Cholesterol: 51 mg/dL (ref 0–99)
Total CHOL/HDL Ratio: 3
Triglycerides: 93 mg/dL (ref 0.0–149.0)
VLDL: 18.6 mg/dL (ref 0.0–40.0)

## 2010-12-10 MED ORDER — ASPIRIN 81 MG PO TABS: 81.0000 mg | ORAL_TABLET | Freq: Every day | ORAL | Status: AC

## 2010-12-10 NOTE — Assessment & Plan Note (Signed)
Check lipids/LFTs today.  Goal LDL < 70.  

## 2010-12-10 NOTE — Progress Notes (Signed)
PCP: Dr. Parke Simmers  70 yo with history of CAD s/p CABG, PAD, HTN, and exertional dyspnea presents for cardiology evaluation.  Patient's most recent cath was in 2003 and showed patent grafts.  She had a left common iliac artery stent in 2009.   She has chronic exertional dyspnea.  She can walk on flat ground without problems (walks for 15 minutes 3 times a week) but gets short of breath with hills or steps.  She is short of breath with moderate housework such as vacuuming and sweeping.  No claudication.  No orthopnea/PND.  No chest pain.  She checks her blood pressure at home and it has always been < 140 systolic recently.   ECG: NSR, IVCD  Labs (5/11): K 4.3, creatinine 1.11, LDL 63, HDL 40 ' Allergies (verified):  No Known Drug Allergies  Past Medical History: 1. Coronary artery disease: CABG 1997 with sequential LIMA-LAD and D, sequential SVG-PDA then PLV1 then PLV2.  There also was an SVG attached to the SVG-PDA that touches down on an OM.  Cath in 2003 showed patent grafts.  2. Hyperlipidemia 3. Hypertension 4. Hypothyroidism 5. Gout 6. Osteoporosis 7. Breast cancer s/p right mastectomy in 1985 8. GERD 9. Peripheral vascular disease: Left common iliac artery stent in 7/09.  Renal artery stenosis with left renal artery stent.  Cath in 4/09 showed 50% in-stent restenosis in left renal artery stent.  10. Low back pain/lumbar disc disease 11. Obesity 12. Discoid lupus 13. OSA, did not tolerate CPAP 14. Dyspnea: echo (5/10) with EF 55-60%, no regional wall motion abnormalities, mild LVH, mild diastolic dysfunction, mild aortic insufficiency, mild MR.   Family History: Family History of CAD Female 1st degree relative Family History Diabetes 1st degree relative Family History Hypertension mother with cerebral aneurysm brother died with stroke  Social History: Alcohol use-no Lives in [place].  Has 7 adopted children, 2 live with her now.  Retired.   Review of Systems        All  systems reviewed and negative except as per HPI.   Current Outpatient Prescriptions  Medication Sig Dispense Refill  . allopurinol (ZYLOPRIM) 300 MG tablet Take 300 mg by mouth daily.        Marland Kitchen amLODipine-benazepril (LOTREL) 10-20 MG per capsule Take 1 capsule by mouth daily.        Marland Kitchen aspirin 325 MG tablet Take 325 mg by mouth daily.        Marland Kitchen atorvastatin (LIPITOR) 40 MG tablet Take 40 mg by mouth daily.        . carvedilol (COREG) 12.5 MG tablet Take 12.5 mg by mouth 2 (two) times daily with a meal.        . clopidogrel (PLAVIX) 75 MG tablet Take 75 mg by mouth daily.        Marland Kitchen guanFACINE (TENEX) 2 MG tablet Take 2 mg by mouth at bedtime.        . hydroxychloroquine (PLAQUENIL) 200 MG tablet Take 400 mg by mouth daily.        Marland Kitchen levothyroxine (SYNTHROID, LEVOTHROID) 125 MCG tablet Take 125 mcg by mouth daily.        Marland Kitchen DISCONTD: atorvastatin (LIPITOR) 40 MG tablet Take 40 mg by mouth daily.        Marland Kitchen DISCONTD: cloNIDine (CATAPRES) 0.1 MG tablet Take 0.1 mg by mouth 2 (two) times daily.        Marland Kitchen DISCONTD: isosorbide mononitrate (IMDUR) 30 MG 24 hr tablet Take 30 mg by mouth daily.        Marland Kitchen  DISCONTD: metoprolol (LOPRESSOR) 50 MG tablet Take 50 mg by mouth 2 (two) times daily.          BP 130/66  Pulse 57  Ht 5\' 5"  (1.651 m)  Wt 219 lb 6.4 oz (99.519 kg)  BMI 36.51 kg/m2 General: NAD, obese.  Neck: No JVD, no thyromegaly or thyroid nodule.  Lungs: Clear to auscultation bilaterally with normal respiratory effort. CV: Nondisplaced PMI.  Heart regular S1/S2, no S3/S4, 1/6 SEM.  No peripheral edema.  No carotid bruit.  1+ PT pulses bilaterally.   Abdomen: Soft, nontender, no hepatosplenomegaly, no distention.  Neurologic: Alert and oriented x 3.  Psych: Normal affect. Extremities: No clubbing or cyanosis.

## 2010-12-10 NOTE — Assessment & Plan Note (Signed)
Blood pressure controlled on current regimen

## 2010-12-10 NOTE — Assessment & Plan Note (Signed)
No chest pain, stable exertional dyspnea.  Suspect that the exertional dyspnea may be more related to obesity/deconditioning.  - Continue Plavix, statin, beta blocker, ACEI. - Can decrease ASA to 81 mg daily.  - Increase exercise level (needs to lose weight).  She should walk 5-6 times a week.

## 2010-12-10 NOTE — Patient Instructions (Signed)
Your physician recommends that you schedule a follow-up appointment in: 1 year with Dr. Shirlee Latch Your physician has recommended you make the following change in your medication: Decrease aspirin to 81 mg by mouth daily. You had lab work done today.

## 2011-02-10 ENCOUNTER — Other Ambulatory Visit: Payer: Self-pay | Admitting: Cardiology

## 2011-04-16 LAB — BASIC METABOLIC PANEL
BUN: 12
CO2: 25
Calcium: 9.1
Chloride: 108
Creatinine, Ser: 1.13
GFR calc Af Amer: 58 — ABNORMAL LOW
GFR calc non Af Amer: 48 — ABNORMAL LOW
Glucose, Bld: 101 — ABNORMAL HIGH
Potassium: 4.2
Sodium: 140

## 2011-04-16 LAB — CBC
HCT: 35.5 — ABNORMAL LOW
Hemoglobin: 11.7 — ABNORMAL LOW
MCHC: 32.9
MCV: 86
Platelets: 220
RBC: 4.13
RDW: 13.7
WBC: 5.7

## 2011-04-18 LAB — BASIC METABOLIC PANEL
BUN: 11
BUN: 15
CO2: 24
CO2: 25
Calcium: 8.8
Calcium: 9.3
Chloride: 104
Chloride: 104
Creatinine, Ser: 0.97
Creatinine, Ser: 1.14
GFR calc Af Amer: 58 — ABNORMAL LOW
GFR calc Af Amer: >60
GFR calc non Af Amer: 48 — ABNORMAL LOW
GFR calc non Af Amer: 57 — ABNORMAL LOW
Glucose, Bld: 101 — ABNORMAL HIGH
Glucose, Bld: 97
Potassium: 3.7
Potassium: 3.8
Sodium: 136
Sodium: 140

## 2011-04-18 LAB — PROTIME-INR
INR: 1
Prothrombin Time: 13.5

## 2011-04-18 LAB — CBC
HCT: 36.6
HCT: 37.9
Hemoglobin: 12.4
Hemoglobin: 12.5
MCHC: 33.1
MCHC: 34
MCV: 84.3
MCV: 85.2
Platelets: 210
Platelets: 230
RBC: 4.34
RBC: 4.45
RDW: 14.9
RDW: 15.2
WBC: 5.5
WBC: 5.9

## 2011-04-18 LAB — APTT: aPTT: 25

## 2011-05-03 LAB — DIFFERENTIAL
Basophils Absolute: 0
Basophils Relative: 0
Eosinophils Absolute: 0
Eosinophils Relative: 0
Lymphocytes Relative: 18
Lymphs Abs: 1.8
Monocytes Absolute: 1 — ABNORMAL HIGH
Monocytes Relative: 10
Neutro Abs: 7.2
Neutrophils Relative %: 71

## 2011-05-03 LAB — URINALYSIS, ROUTINE W REFLEX MICROSCOPIC
Bilirubin Urine: NEGATIVE
Glucose, UA: NEGATIVE
Ketones, ur: NEGATIVE
Nitrite: POSITIVE — AB
Protein, ur: 30 — AB
Specific Gravity, Urine: 1.008
Urobilinogen, UA: 1
pH: 7

## 2011-05-03 LAB — CBC
HCT: 37.8
Hemoglobin: 12.6
MCHC: 33.3
MCV: 84.6
Platelets: 258
RBC: 4.47
RDW: 15.1 — ABNORMAL HIGH
WBC: 10.1

## 2011-05-03 LAB — COMPREHENSIVE METABOLIC PANEL
ALT: 15
AST: 21
Albumin: 3.9
Alkaline Phosphatase: 134 — ABNORMAL HIGH
BUN: 20
CO2: 26
Calcium: 9.8
Chloride: 106
Creatinine, Ser: 1.21 — ABNORMAL HIGH
GFR calc Af Amer: 54 — ABNORMAL LOW
GFR calc non Af Amer: 44 — ABNORMAL LOW
Glucose, Bld: 96
Potassium: 4
Sodium: 139
Total Bilirubin: 1.2
Total Protein: 8.8 — ABNORMAL HIGH

## 2011-05-03 LAB — URINE MICROSCOPIC-ADD ON

## 2011-05-03 LAB — URINE CULTURE: Colony Count: 80000

## 2012-03-13 ENCOUNTER — Other Ambulatory Visit: Payer: Self-pay | Admitting: Cardiology

## 2012-04-01 ENCOUNTER — Other Ambulatory Visit: Payer: Self-pay | Admitting: *Deleted

## 2012-04-01 MED ORDER — CARVEDILOL 12.5 MG PO TABS: 12.5000 mg | ORAL_TABLET | Freq: Two times a day (BID) | ORAL | Status: DC

## 2012-04-07 ENCOUNTER — Other Ambulatory Visit: Payer: Self-pay | Admitting: Family Medicine

## 2012-04-07 ENCOUNTER — Ambulatory Visit
Admission: RE | Admit: 2012-04-07 | Discharge: 2012-04-07 | Disposition: A | Discharge status: Home / Self Care | Payer: Medicare Other | Source: Ambulatory Visit | Attending: Family Medicine | Admitting: Family Medicine

## 2012-04-07 DIAGNOSIS — R52 Pain, unspecified: Secondary | ICD-10-CM

## 2012-09-09 ENCOUNTER — Other Ambulatory Visit: Payer: Self-pay | Admitting: Cardiology

## 2012-09-09 DIAGNOSIS — Z8679 Personal history of other diseases of the circulatory system: Secondary | ICD-10-CM

## 2012-09-11 ENCOUNTER — Other Ambulatory Visit: Payer: Self-pay | Admitting: Cardiology

## 2012-09-11 ENCOUNTER — Other Ambulatory Visit: Payer: Medicare Other

## 2012-09-11 DIAGNOSIS — Z8679 Personal history of other diseases of the circulatory system: Secondary | ICD-10-CM

## 2012-09-21 ENCOUNTER — Ambulatory Visit
Admission: RE | Admit: 2012-09-21 | Discharge: 2012-09-21 | Disposition: A | Discharge status: Home / Self Care | Payer: Medicare Other | Source: Ambulatory Visit | Attending: Cardiology | Admitting: Cardiology

## 2012-09-21 DIAGNOSIS — Z8679 Personal history of other diseases of the circulatory system: Secondary | ICD-10-CM

## 2013-04-05 ENCOUNTER — Other Ambulatory Visit: Payer: Self-pay

## 2013-04-05 MED ORDER — CARVEDILOL 12.5 MG PO TABS: 12.5000 mg | ORAL_TABLET | Freq: Two times a day (BID) | ORAL | Status: DC

## 2013-04-10 ENCOUNTER — Encounter (HOSPITAL_BASED_OUTPATIENT_CLINIC_OR_DEPARTMENT_OTHER): Payer: Self-pay | Admitting: Emergency Medicine

## 2013-04-10 ENCOUNTER — Inpatient Hospital Stay (HOSPITAL_BASED_OUTPATIENT_CLINIC_OR_DEPARTMENT_OTHER)
Admission: EM | Admit: 2013-04-10 | Discharge: 2013-04-13 | DRG: 684 | Disposition: A | Discharge status: Home / Self Care | Payer: Medicare Other | Attending: Internal Medicine | Admitting: Internal Medicine

## 2013-04-10 ENCOUNTER — Emergency Department (HOSPITAL_BASED_OUTPATIENT_CLINIC_OR_DEPARTMENT_OTHER): Payer: Medicare Other

## 2013-04-10 DIAGNOSIS — E785 Hyperlipidemia, unspecified: Secondary | ICD-10-CM

## 2013-04-10 DIAGNOSIS — Z7902 Long term (current) use of antithrombotics/antiplatelets: Secondary | ICD-10-CM

## 2013-04-10 DIAGNOSIS — M109 Gout, unspecified: Secondary | ICD-10-CM | POA: Diagnosis present

## 2013-04-10 DIAGNOSIS — E860 Dehydration: Secondary | ICD-10-CM

## 2013-04-10 DIAGNOSIS — M51379 Other intervertebral disc degeneration, lumbosacral region without mention of lumbar back pain or lower extremity pain: Secondary | ICD-10-CM | POA: Diagnosis present

## 2013-04-10 DIAGNOSIS — I10 Essential (primary) hypertension: Secondary | ICD-10-CM | POA: Diagnosis present

## 2013-04-10 DIAGNOSIS — Z881 Allergy status to other antibiotic agents status: Secondary | ICD-10-CM

## 2013-04-10 DIAGNOSIS — K219 Gastro-esophageal reflux disease without esophagitis: Secondary | ICD-10-CM

## 2013-04-10 DIAGNOSIS — I2510 Atherosclerotic heart disease of native coronary artery without angina pectoris: Secondary | ICD-10-CM

## 2013-04-10 DIAGNOSIS — N289 Disorder of kidney and ureter, unspecified: Secondary | ICD-10-CM

## 2013-04-10 DIAGNOSIS — T502X5A Adverse effect of carbonic-anhydrase inhibitors, benzothiadiazides and other diuretics, initial encounter: Secondary | ICD-10-CM | POA: Diagnosis present

## 2013-04-10 DIAGNOSIS — Z853 Personal history of malignant neoplasm of breast: Secondary | ICD-10-CM

## 2013-04-10 DIAGNOSIS — Z6831 Body mass index (BMI) 31.0-31.9, adult: Secondary | ICD-10-CM

## 2013-04-10 DIAGNOSIS — I739 Peripheral vascular disease, unspecified: Secondary | ICD-10-CM | POA: Diagnosis present

## 2013-04-10 DIAGNOSIS — E669 Obesity, unspecified: Secondary | ICD-10-CM | POA: Diagnosis present

## 2013-04-10 DIAGNOSIS — M5137 Other intervertebral disc degeneration, lumbosacral region: Secondary | ICD-10-CM | POA: Diagnosis present

## 2013-04-10 DIAGNOSIS — R001 Bradycardia, unspecified: Secondary | ICD-10-CM

## 2013-04-10 DIAGNOSIS — G4733 Obstructive sleep apnea (adult) (pediatric): Secondary | ICD-10-CM | POA: Diagnosis present

## 2013-04-10 DIAGNOSIS — N179 Acute kidney failure, unspecified: Principal | ICD-10-CM

## 2013-04-10 DIAGNOSIS — M810 Age-related osteoporosis without current pathological fracture: Secondary | ICD-10-CM | POA: Diagnosis present

## 2013-04-10 DIAGNOSIS — Z79899 Other long term (current) drug therapy: Secondary | ICD-10-CM

## 2013-04-10 DIAGNOSIS — Z7982 Long term (current) use of aspirin: Secondary | ICD-10-CM

## 2013-04-10 DIAGNOSIS — L930 Discoid lupus erythematosus: Secondary | ICD-10-CM | POA: Diagnosis present

## 2013-04-10 DIAGNOSIS — E875 Hyperkalemia: Secondary | ICD-10-CM

## 2013-04-10 DIAGNOSIS — I498 Other specified cardiac arrhythmias: Secondary | ICD-10-CM | POA: Diagnosis present

## 2013-04-10 DIAGNOSIS — Z87891 Personal history of nicotine dependence: Secondary | ICD-10-CM

## 2013-04-10 DIAGNOSIS — I959 Hypotension, unspecified: Secondary | ICD-10-CM | POA: Diagnosis present

## 2013-04-10 DIAGNOSIS — E039 Hypothyroidism, unspecified: Secondary | ICD-10-CM

## 2013-04-10 DIAGNOSIS — Z951 Presence of aortocoronary bypass graft: Secondary | ICD-10-CM

## 2013-04-10 LAB — URINALYSIS, ROUTINE W REFLEX MICROSCOPIC
Bilirubin Urine: NEGATIVE
Glucose, UA: NEGATIVE mg/dL
Hgb urine dipstick: NEGATIVE
Ketones, ur: NEGATIVE mg/dL
Nitrite: NEGATIVE
Protein, ur: NEGATIVE mg/dL
Specific Gravity, Urine: 1.007 (ref 1.005–1.030)
Urobilinogen, UA: 1 mg/dL (ref 0.0–1.0)
pH: 6.5 (ref 5.0–8.0)

## 2013-04-10 LAB — COMPREHENSIVE METABOLIC PANEL
ALT: 24 U/L (ref 0–35)
AST: 24 U/L (ref 0–37)
Albumin: 3.7 g/dL (ref 3.5–5.2)
Alkaline Phosphatase: 120 U/L — ABNORMAL HIGH (ref 39–117)
BUN: 51 mg/dL — ABNORMAL HIGH (ref 6–23)
CO2: 22 mEq/L (ref 19–32)
Calcium: 10.1 mg/dL (ref 8.4–10.5)
Chloride: 102 mEq/L (ref 96–112)
Creatinine, Ser: 3.1 mg/dL — ABNORMAL HIGH (ref 0.50–1.10)
GFR calc Af Amer: 16 mL/min — ABNORMAL LOW (ref >90)
GFR calc non Af Amer: 14 mL/min — ABNORMAL LOW (ref >90)
Glucose, Bld: 112 mg/dL — ABNORMAL HIGH (ref 70–99)
Potassium: 5.9 mEq/L — ABNORMAL HIGH (ref 3.5–5.1)
Sodium: 136 mEq/L (ref 135–145)
Total Bilirubin: 0.5 mg/dL (ref 0.3–1.2)
Total Protein: 7.3 g/dL (ref 6.0–8.3)

## 2013-04-10 LAB — CBC WITH DIFFERENTIAL/PLATELET
Basophils Absolute: 0.1 10*3/uL (ref 0.0–0.1)
Basophils Relative: 1 % (ref 0–1)
Eosinophils Absolute: 0.2 10*3/uL (ref 0.0–0.7)
Eosinophils Relative: 4 % (ref 0–5)
HCT: 31.6 % — ABNORMAL LOW (ref 36.0–46.0)
Hemoglobin: 10.3 g/dL — ABNORMAL LOW (ref 12.0–15.0)
Lymphocytes Relative: 27 % (ref 12–46)
Lymphs Abs: 1.5 10*3/uL (ref 0.7–4.0)
MCH: 27.8 pg (ref 26.0–34.0)
MCHC: 32.6 g/dL (ref 30.0–36.0)
MCV: 85.2 fL (ref 78.0–100.0)
Monocytes Absolute: 0.8 10*3/uL (ref 0.1–1.0)
Monocytes Relative: 13 % — ABNORMAL HIGH (ref 3–12)
Neutro Abs: 3.2 10*3/uL (ref 1.7–7.7)
Neutrophils Relative %: 56 % (ref 43–77)
Platelets: 228 10*3/uL (ref 150–400)
RBC: 3.71 MIL/uL — ABNORMAL LOW (ref 3.87–5.11)
RDW: 15.1 % (ref 11.5–15.5)
WBC: 5.8 10*3/uL (ref 4.0–10.5)

## 2013-04-10 LAB — URINE MICROSCOPIC-ADD ON

## 2013-04-10 LAB — CG4 I-STAT (LACTIC ACID): Lactic Acid, Venous: 0.76 mmol/L (ref 0.5–2.2)

## 2013-04-10 LAB — TROPONIN I: Troponin I: <0.3 ng/mL (ref <0.30)

## 2013-04-10 MED ORDER — ASPIRIN 325 MG PO TABS: 81.0000 mg | ORAL_TABLET | Freq: Every day | ORAL | Status: DC

## 2013-04-10 MED ORDER — ADULT MULTIVITAMIN W/MINERALS CH
1.0000 | ORAL_TABLET | Freq: Every day | ORAL | Status: DC
  Administered 2013-04-11 – 2013-04-13 (×3): 1 via ORAL
  Filled 2013-04-10 (×3): qty 1

## 2013-04-10 MED ORDER — ACETAMINOPHEN 650 MG RE SUPP: 650.0000 mg | Freq: Four times a day (QID) | RECTAL | Status: DC | PRN

## 2013-04-10 MED ORDER — DEXTROSE 5 % IV SOLN
1.0000 g | Freq: Once | INTRAVENOUS | Status: AC
  Administered 2013-04-10: 1 g via INTRAVENOUS
  Filled 2013-04-10: qty 10

## 2013-04-10 MED ORDER — ZOLPIDEM TARTRATE 5 MG PO TABS: 5.0000 mg | ORAL_TABLET | Freq: Every evening | ORAL | Status: DC | PRN

## 2013-04-10 MED ORDER — CLOPIDOGREL BISULFATE 75 MG PO TABS
75.0000 mg | ORAL_TABLET | Freq: Every day | ORAL | Status: DC
  Administered 2013-04-11 – 2013-04-13 (×3): 75 mg via ORAL
  Filled 2013-04-10 (×3): qty 1

## 2013-04-10 MED ORDER — SODIUM CHLORIDE 0.9 % IV SOLN: INTRAVENOUS | Status: DC

## 2013-04-10 MED ORDER — SODIUM POLYSTYRENE SULFONATE 15 GM/60ML PO SUSP
30.0000 g | Freq: Once | ORAL | Status: AC
  Administered 2013-04-11: 30 g via ORAL
  Filled 2013-04-10: qty 120

## 2013-04-10 MED ORDER — ATORVASTATIN CALCIUM 40 MG PO TABS
40.0000 mg | ORAL_TABLET | Freq: Every day | ORAL | Status: DC
  Administered 2013-04-11 – 2013-04-13 (×3): 40 mg via ORAL
  Filled 2013-04-10 (×3): qty 1

## 2013-04-10 MED ORDER — SODIUM CHLORIDE 0.9 % IV SOLN
INTRAVENOUS | Status: DC
  Administered 2013-04-11 (×2): via INTRAVENOUS

## 2013-04-10 MED ORDER — ONDANSETRON HCL 4 MG/2ML IJ SOLN: 4.0000 mg | Freq: Four times a day (QID) | INTRAMUSCULAR | Status: DC | PRN

## 2013-04-10 MED ORDER — LEVOTHYROXINE SODIUM 100 MCG PO TABS
100.0000 ug | ORAL_TABLET | Freq: Every day | ORAL | Status: DC
  Administered 2013-04-11 – 2013-04-13 (×3): 100 ug via ORAL
  Filled 2013-04-10 (×4): qty 1

## 2013-04-10 MED ORDER — SODIUM CHLORIDE 0.9 % IV BOLUS (SEPSIS)
1000.0000 mL | Freq: Once | INTRAVENOUS | Status: AC
  Administered 2013-04-10: 1000 mL via INTRAVENOUS

## 2013-04-10 MED ORDER — SODIUM CHLORIDE 0.9 % IV SOLN
INTRAVENOUS | Status: AC
  Administered 2013-04-11: via INTRAVENOUS

## 2013-04-10 MED ORDER — OXYCODONE HCL 5 MG PO TABS: 5.0000 mg | ORAL_TABLET | ORAL | Status: DC | PRN

## 2013-04-10 MED ORDER — PANTOPRAZOLE SODIUM 40 MG PO TBEC
40.0000 mg | DELAYED_RELEASE_TABLET | Freq: Every day | ORAL | Status: DC
  Administered 2013-04-11 – 2013-04-13 (×3): 40 mg via ORAL
  Filled 2013-04-10 (×2): qty 1

## 2013-04-10 MED ORDER — ACETAMINOPHEN 325 MG PO TABS
650.0000 mg | ORAL_TABLET | Freq: Four times a day (QID) | ORAL | Status: DC | PRN
  Administered 2013-04-12: 650 mg via ORAL
  Filled 2013-04-10: qty 2

## 2013-04-10 MED ORDER — ASPIRIN 325 MG PO TABS
162.0000 mg | ORAL_TABLET | Freq: Every day | ORAL | Status: DC
  Administered 2013-04-11 – 2013-04-12 (×2): 162 mg via ORAL
  Filled 2013-04-10 (×3): qty 0.5

## 2013-04-10 MED ORDER — HYDROMORPHONE HCL PF 1 MG/ML IJ SOLN: 0.5000 mg | INTRAMUSCULAR | Status: DC | PRN

## 2013-04-10 MED ORDER — EZETIMIBE 10 MG PO TABS
10.0000 mg | ORAL_TABLET | Freq: Every day | ORAL | Status: DC
  Administered 2013-04-11 – 2013-04-13 (×3): 10 mg via ORAL
  Filled 2013-04-10 (×3): qty 1

## 2013-04-10 MED ORDER — MULTIVITAMINS PO CAPS: 1.0000 | ORAL_CAPSULE | Freq: Every day | ORAL | Status: DC

## 2013-04-10 MED ORDER — ONDANSETRON HCL 4 MG PO TABS: 4.0000 mg | ORAL_TABLET | Freq: Four times a day (QID) | ORAL | Status: DC | PRN

## 2013-04-10 MED ORDER — ENOXAPARIN SODIUM 30 MG/0.3ML ~~LOC~~ SOLN
30.0000 mg | SUBCUTANEOUS | Status: DC
  Administered 2013-04-11 (×2): 30 mg via SUBCUTANEOUS
  Filled 2013-04-10 (×4): qty 0.3

## 2013-04-10 NOTE — ED Notes (Signed)
Pt having lightheadedness since Wednesday.  Pt feels fatigued and unsteady on her feet.

## 2013-04-10 NOTE — ED Notes (Signed)
[facility] is here for pt pick up.  Pts family member notified where pt is going.  Call for report to 5W

## 2013-04-10 NOTE — ED Provider Notes (Signed)
CSN: [id number]     Arrival date & time 04/10/13  1313 History  This chart was scribed for Hurman Horn, MD by Ronal Fear, ED Scribe. This patient was seen in room MH04/MH04 and the patient's care was started at 3:23 PM.    Chief Complaint  Patient presents with  . Dizziness    The history is provided by the patient and a relative. No language interpreter was used.    HPI Comments: [name] is a 72 y.o. female brought in by ambulance, who presents to the Emergency Department complaining of Immediate onset constant general weakness and light headedness 24/7 worse when stands up since 12pm Wednesday, 4x days ago . Pt states that she has been dropping things with both hands due to weakness.Pt lives with her niece.  At baseline pt ambulates without difficulty and is self sufficient.  Pt Denies LOC, HA, chest pain, new SOB, or new hemiparesis or numbness  without sensation of motion and no room spinning or pain onset   Pt has a Hx of heart disease and high BP.No change speech, swallow, vision, understanding.  Past Medical History  Diagnosis Date  . CAD (coronary artery disease)     s/p CABG  . HLD (hyperlipidemia)   . HTN (hypertension)   . Hypothyroidism   . Gout   . Osteoporosis   . Breast cancer     s/p mastectomy  . GERD (gastroesophageal reflux disease)   . PVD (peripheral vascular disease)   . Low back pain   . DDD (degenerative disc disease), lumbar   . Obesity   . Discoid lupus   . OSA (obstructive sleep apnea)     did not tolerate CPAP  . Dyspnea    Past Surgical History  Procedure Laterality Date  . Hysterectomy - unknown type    . Ankle surgery  7/05    left  . Mastectomy  1985    right  . Coronary artery bypass graft  1997  . Renal artery stent    . Oophorectomy    . Elbow surgery      right   Family History  Problem Relation Age of Onset  . Coronary artery disease    . Diabetes    . Hypertension    . Aneurysm    . Stroke     History  Substance  Use Topics  . Smoking status: Former Smoker    Quit date: 07/23/1995  . Smokeless tobacco: Not on file  . Alcohol Use: No   OB History   Grav Para Term Preterm Abortions TAB SAB Ect Mult Living                 Review of Systems 10 Systems reviewed and all are negative for acute change except as noted in the HPI.    Allergies  Amoxicillin and Clarithromycin  Home Medications   No current outpatient prescriptions on file. BP 93/46  Pulse 75  Temp(Src) 98.7 F (37.1 C) (Oral)  Ht 5\' 5"  (1.651 m)  Wt 189 lb (85.73 kg)  BMI 31.45 kg/m2  SpO2 99% Physical Exam  Nursing note and vitals reviewed. Constitutional:  Awake, alert, nontoxic appearance with baseline speech for patient.  HENT:  Head: Atraumatic.  Mouth/Throat: No oropharyngeal exudate.  Eyes: EOM are normal. Pupils are equal, round, and reactive to light. Right eye exhibits no discharge. Left eye exhibits no discharge.  No nystagmus   Neck: Neck supple.  Cardiovascular: Normal rate and regular  rhythm.   No murmur heard. Pulmonary/Chest: Effort normal and breath sounds normal. No stridor. No respiratory distress. She has no wheezes. She has no rales. She exhibits no tenderness.  Abdominal: Soft. Bowel sounds are normal. She exhibits no mass. There is no tenderness. There is no rebound.  Musculoskeletal: She exhibits no tenderness.  Baseline ROM, moves extremities with no obvious new focal weakness.  Lymphadenopathy:    She has no cervical adenopathy.  Neurological:  Awake, alert, cooperative and aware of situation; motor strength bilaterally; sensation normal to light touch bilaterally; peripheral visual fields full to confrontation; no facial asymmetry; tongue midline; major cranial nerves appear intact; no pronator drift, normal finger to nose bilaterally, baseline gait without new ataxia.  Skin: No rash noted.  Psychiatric: She has a normal mood and affect.    .  ED Course  Procedures (including critical  care time) ECG: Sinus bradycardia, ventricular rate 49, normal axis, septal Q waves, no acute ischemic changes noted, compared with May 2010 rate now slower DIAGNOSTIC STUDIES: Oxygen Saturation is 99% on RA, normal by my interpretation.    COORDINATION OF CARE: 3:37 PM Patient / Family / Caregiver informed of clinical course, including chest X-ray and urinalysis. Pt understands medical decision-making process, and agrees with plan.  Pt does feel light-headed when stands and walks at bedside.Pt stable in ED with no significant deterioration in condition.  Patient / Family / Caregiver informed of clinical course, understand medical decision-making process, and agree with plan. D/w [facility] for admit. Labs Review Labs Reviewed  CBC WITH DIFFERENTIAL - Abnormal; Notable for the following:    RBC 3.71 (*)    Hemoglobin 10.3 (*)    HCT 31.6 (*)    Monocytes Relative 13 (*)    All other components within normal limits  COMPREHENSIVE METABOLIC PANEL - Abnormal; Notable for the following:    Potassium 5.9 (*)    Glucose, Bld 112 (*)    BUN 51 (*)    Creatinine, Ser 3.10 (*)    Alkaline Phosphatase 120 (*)    GFR calc non Af Amer 14 (*)    GFR calc Af Amer 16 (*)    All other components within normal limits  URINALYSIS, ROUTINE W REFLEX MICROSCOPIC - Abnormal; Notable for the following:    Leukocytes, UA LARGE (*)    All other components within normal limits  CBC - Abnormal; Notable for the following:    RBC 3.83 (*)    Hemoglobin 10.6 (*)    HCT 32.0 (*)    All other components within normal limits  BASIC METABOLIC PANEL - Abnormal; Notable for the following:    BUN 43 (*)    Creatinine, Ser 2.14 (*)    GFR calc non Af Amer 22 (*)    GFR calc Af Amer 25 (*)    All other components within normal limits  BASIC METABOLIC PANEL - Abnormal; Notable for the following:    CO2 16 (*)    BUN 35 (*)    Creatinine, Ser 1.73 (*)    GFR calc non Af Amer 28 (*)    GFR calc Af Amer 33 (*)     All other components within normal limits  TROPONIN I  URINE MICROSCOPIC-ADD ON  TSH  CG4 I-STAT (LACTIC ACID)   Imaging Review Dg Chest 2 View  04/10/2013   *RADIOLOGY REPORT*  Clinical Data: Fatique, dizziness, coronary artery disease.  CHEST - 2 VIEW  Comparison: April 07, 2012  Findings: There  is no focal infiltrate, pulmonary edema, or pleural effusion.  The patient status post prior CABG.  The heart size is normal.  The soft tissues and osseous structures are stable.  IMPRESSION: No acute cardiopulmonary disease identified.   Original Report Authenticated By: Sherian Rein, M.D.    MDM   1. Renal insufficiency   2. Dehydration   3. Hypotension   4. Hyperkalemia   5. ARF (acute renal failure)   6. Coronary atherosclerosis of unspecified type of vessel, native or graft   7. Hypotension, unspecified   8. Other and unspecified hyperlipidemia   9. Sinus bradycardia   10. Unspecified hypothyroidism    I personally performed the services described in this documentation, which was scribed in my presence. The recorded information has been reviewed and is accurate. The patient appears reasonably stabilized for transfer considering the current resources, flow, and capabilities available in the ED at this time, and I doubt any other [facility] requiring further screening and/or treatment in the ED prior to transfer. `  Hurman Horn, MD 04/11/13 1356

## 2013-04-10 NOTE — H&P (Signed)
[facility] History and Physical  [name] ZOX:[id number] DOB: 08-Mar-1941 DOA: 04/10/2013  Referring physician:  EDP PCP: Geraldo Pitter, MD  Specialists:   Chief Complaint:   Weakness  HPI: [name] is a 72 y.o. female with Multiple Medical problems who presented to the [facility] ED with complaints of increased weakness and fatigue and dizziness and feeling as if she would pass out for the past 3 days.  She denied having any nausea vomiting or diarrhea, or fevers or chills.  She also denied having any chest pain or headaches.    She was found to have a potassium level of 5.9, and an elevated BUN/Cr of  51/3.10.  She was started on IVFs and transferred to [facility] for admission.      Review of Systems: The patient denies anorexia, fever, chills, headaches, weight loss, vision loss, diplopia, dizziness, decreased hearing, rhinitis, hoarseness, chest pain, syncope, dyspnea on exertion, peripheral edema, balance deficits, cough, hemoptysis, abdominal pain, nausea, vomiting, diarrhea, constipation, hematemesis, melena, hematochezia, severe indigestion/heartburn, dysuria, hematuria, incontinence, muscle weakness, suspicious skin lesions, transient blindness, difficulty walking, depression, unusual weight change, abnormal bleeding, enlarged lymph nodes, angioedema, and breast masses.    Past Medical History  Diagnosis Date  . CAD (coronary artery disease)     s/p CABG  . HLD (hyperlipidemia)   . HTN (hypertension)   . Hypothyroidism   . Gout   . Osteoporosis   . Breast cancer     s/p mastectomy  . GERD (gastroesophageal reflux disease)   . PVD (peripheral vascular disease)   . Low back pain   . DDD (degenerative disc disease), lumbar   . Obesity   . Discoid lupus   . OSA (obstructive sleep apnea)     did not tolerate CPAP  . Dyspnea     Past Surgical History  Procedure Laterality Date  . Hysterectomy - unknown type    . Ankle surgery  7/05    left  .  Mastectomy  1985    right  . Coronary artery bypass graft  1997  . Renal artery stent    . Oophorectomy    . Elbow surgery      right    Prior to Admission medications   Medication Sig Start Date End Date Taking? Authorizing Provider  amLODipine-benazepril (LOTREL) 10-20 MG per capsule Take 1 capsule by mouth daily.     Yes Historical Provider, MD  aspirin 81 MG tablet Take 1 tablet (81 mg total) by mouth daily. 12/10/10  Yes Laurey Morale, MD  atorvastatin (LIPITOR) 40 MG tablet Take 40 mg by mouth daily.     Yes Historical Provider, MD  carvedilol (COREG) 12.5 MG tablet Take 1 tablet (12.5 mg total) by mouth 2 (two) times daily with a meal. 04/05/13  Yes Laurey Morale, MD  clopidogrel (PLAVIX) 75 MG tablet Take 75 mg by mouth daily.     Yes Historical Provider, MD  ezetimibe (ZETIA) 10 MG tablet Take 10 mg by mouth daily.   Yes Historical Provider, MD  guanFACINE (TENEX) 2 MG tablet Take 2 mg by mouth at bedtime.     Yes Historical Provider, MD  levothyroxine (SYNTHROID, LEVOTHROID) 125 MCG tablet Take 100 mcg by mouth daily.    Yes Historical Provider, MD  Multiple Vitamin (MULTIVITAMIN) capsule Take 1 capsule by mouth daily.   Yes Historical Provider, MD  omeprazole (PRILOSEC) 20 MG capsule Take 20 mg by mouth daily.   Yes Historical  Provider, MD  triamterene-hydrochlorothiazide (DYAZIDE) 37.5-25 MG per capsule Take 1 capsule by mouth every morning.   Yes Historical Provider, MD    Allergies  Allergen Reactions  . Amoxicillin   . Clarithromycin     Social History:  reports that she quit smoking about 17 years ago. She does not have any smokeless tobacco history on file. She reports that she does not drink alcohol. Her drug history is not on file.     Family History  Problem Relation Age of Onset  . Coronary artery disease    . Diabetes    . Hypertension    . Aneurysm    . Stroke         Physical Exam:  GEN:  Pleasant Elderly Overweight 72 y.o. African American  female examined and in no acute distress; cooperative with exam Filed Vitals:   04/10/13 1830 04/10/13 1900 04/10/13 1939 04/10/13 2054  BP: 98/48 108/50 127/71 114/68  Pulse:   42 51  Temp:   97.9 F (36.6 C) 97.5 F (36.4 C)  TempSrc:   Oral Oral  Resp: 16 15 14 14   Height:    5\' 5"  (1.651 m)  Weight:    84.9 kg (187 lb 2.7 oz)  SpO2:   100% 100%   Blood pressure 114/68, pulse 51, temperature 97.5 F (36.4 C), temperature source Oral, resp. rate 14, height 5\' 5"  (1.651 m), weight 84.9 kg (187 lb 2.7 oz), SpO2 100.00%. PSYCH: She is alert and oriented x4; does not appear anxious does not appear depressed; affect is normal HEENT: Normocephalic and Atraumatic, Mucous membranes pink; PERRLA; EOM intact; Fundi:  Benign;  No scleral icterus, Nares: Patent, Oropharynx: Clear, Fair Dentition, Neck:  FROM, no cervical lymphadenopathy nor thyromegaly or carotid bruit; no JVD; Breasts:: Not examined CHEST WALL: No tenderness CHEST: Normal respiration, clear to auscultation bilaterally HEART: Regular rate and rhythm; no murmurs rubs or gallops BACK: No kyphosis or scoliosis; no CVA tenderness ABDOMEN: Positive Bowel Sounds, Obese, soft non-tender; no masses, no organomegaly. Rectal Exam: Not done EXTREMITIES: No cyanosis, clubbing or edema; no ulcerations. Genitalia: not examined PULSES: 2+ and symmetric SKIN: Normal hydration no rash or ulceration CNS: Cranial nerves 2-12 grossly intact no focal neurologic deficit    Labs on Admission:  Basic Metabolic Panel:  Recent Labs Lab 04/10/13 1345  NA 136  K 5.9*  CL 102  CO2 22  GLUCOSE 112*  BUN 51*  CREATININE 3.10*  CALCIUM 10.1   Liver Function Tests:  Recent Labs Lab 04/10/13 1345  AST 24  ALT 24  ALKPHOS 120*  BILITOT 0.5  PROT 7.3  ALBUMIN 3.7   No results found for this basename: LIPASE, AMYLASE,  in the last 168 hours No results found for this basename: AMMONIA,  in the last 168 hours CBC:  Recent Labs Lab  04/10/13 1345  WBC 5.8  NEUTROABS 3.2  HGB 10.3*  HCT 31.6*  MCV 85.2  PLT 228   Cardiac Enzymes:  Recent Labs Lab 04/10/13 1345  TROPONINI <0.30    BNP (last 3 results) No results found for this basename: PROBNP,  in the last 8760 hours CBG: No results found for this basename: GLUCAP,  in the last 168 hours  Radiological Exams on Admission: Dg Chest 2 View  04/10/2013   *RADIOLOGY REPORT*  Clinical Data: Fatique, dizziness, coronary artery disease.  CHEST - 2 VIEW  Comparison: April 07, 2012  Findings: There is no focal infiltrate, pulmonary edema, or pleural effusion.  The patient status post prior CABG.  The heart size is normal.  The soft tissues and osseous structures are stable.  IMPRESSION: No acute cardiopulmonary disease identified.   Original Report Authenticated By: Sherian Rein, M.D.     EKG: Independently reviewed.  Sinus Bradycardia rate 49.     Assessment/Plan Principal Problem:   ARF (acute renal failure) Active Problems:   Hyperkalemia   Hypotension, unspecified   Sinus bradycardia   HYPOTHYROIDISM   HYPERLIPIDEMIA   CORONARY ARTERY DISEASE   1.   ARF/Dehydration- due to Diuretic therapy,  Hold diuretics and ACE and ARB Rx,  and administer IVFs for rehydration.     2.   Hyperkalemia-  Due to ARF, Kayexalate X 1 , and IVFs.  Recheck K+level 6 hours after Kayexalate Rx.  Retreat if K+  Still > 5.3.  Monitor on Telemetry.    3.   Sinus Bradycardia- Hold Beta Blocker and Calcium Channel Blocker and treat Hyperkalemia.  Monitor.    4.   Hypotension- Hold BP meds and gently rehydrate.  Monitor BUN/Cr, and BPs.    5.  CAD - stable .  Continue Plavix and ASA rx.    6.  Hypothyroidism-  Continue Levothyroxine Rx, and check TSH.    7.  Hyperlipidemia- continue Lipitor Rx and Zetia Rx.    8.  Lovenox For DVT Prophylaxis.            Code Status:    FULL CODE Family Communication:   No Family Present Disposition Plan:    Inpatient   Time  spent:  92 Minutes  Ron Parker [facility] Pager [phone]  If 7PM-7AM, please contact night-coverage [email or web address] Password [facility] 04/10/2013, 10:09 PM

## 2013-04-11 ENCOUNTER — Encounter (HOSPITAL_COMMUNITY): Payer: Self-pay | Admitting: *Deleted

## 2013-04-11 DIAGNOSIS — E860 Dehydration: Secondary | ICD-10-CM

## 2013-04-11 LAB — BASIC METABOLIC PANEL
BUN: 43 mg/dL — ABNORMAL HIGH (ref 6–23)
BUN: 35 mg/dL — ABNORMAL HIGH (ref 6–23)
CO2: 21 mEq/L (ref 19–32)
CO2: 16 mEq/L — ABNORMAL LOW (ref 19–32)
Calcium: 9.2 mg/dL (ref 8.4–10.5)
Calcium: 9 mg/dL (ref 8.4–10.5)
Chloride: 109 mEq/L (ref 96–112)
Chloride: 107 mEq/L (ref 96–112)
Creatinine, Ser: 2.14 mg/dL — ABNORMAL HIGH (ref 0.50–1.10)
Creatinine, Ser: 1.73 mg/dL — ABNORMAL HIGH (ref 0.50–1.10)
GFR calc Af Amer: 25 mL/min — ABNORMAL LOW (ref >90)
GFR calc Af Amer: 33 mL/min — ABNORMAL LOW (ref >90)
GFR calc non Af Amer: 22 mL/min — ABNORMAL LOW (ref >90)
GFR calc non Af Amer: 28 mL/min — ABNORMAL LOW (ref >90)
Glucose, Bld: 91 mg/dL (ref 70–99)
Glucose, Bld: 96 mg/dL (ref 70–99)
Potassium: 5 mEq/L (ref 3.5–5.1)
Potassium: 4.4 mEq/L (ref 3.5–5.1)
Sodium: 140 mEq/L (ref 135–145)
Sodium: 136 mEq/L (ref 135–145)

## 2013-04-11 LAB — URINALYSIS, ROUTINE W REFLEX MICROSCOPIC
Bilirubin Urine: NEGATIVE
Glucose, UA: NEGATIVE mg/dL
Hgb urine dipstick: NEGATIVE
Ketones, ur: NEGATIVE mg/dL
Leukocytes, UA: NEGATIVE
Nitrite: NEGATIVE
Protein, ur: NEGATIVE mg/dL
Specific Gravity, Urine: 1.01 (ref 1.005–1.030)
Urobilinogen, UA: 1 mg/dL (ref 0.0–1.0)
pH: 7 (ref 5.0–8.0)

## 2013-04-11 LAB — GLUCOSE, CAPILLARY: Glucose-Capillary: 100 mg/dL — ABNORMAL HIGH (ref 70–99)

## 2013-04-11 LAB — CBC
HCT: 32 % — ABNORMAL LOW (ref 36.0–46.0)
Hemoglobin: 10.6 g/dL — ABNORMAL LOW (ref 12.0–15.0)
MCH: 27.7 pg (ref 26.0–34.0)
MCHC: 33.1 g/dL (ref 30.0–36.0)
MCV: 83.6 fL (ref 78.0–100.0)
Platelets: 212 10*3/uL (ref 150–400)
RBC: 3.83 MIL/uL — ABNORMAL LOW (ref 3.87–5.11)
RDW: 15.3 % (ref 11.5–15.5)
WBC: 4.7 10*3/uL (ref 4.0–10.5)

## 2013-04-11 LAB — TSH: TSH: 2.107 u[IU]/mL (ref 0.350–4.500)

## 2013-04-11 MED ORDER — WHITE PETROLATUM GEL
Status: AC
  Administered 2013-04-11: 08:00:00
  Filled 2013-04-11: qty 5

## 2013-04-11 NOTE — Progress Notes (Signed)
[facility] PROGRESS NOTE  [name] ZOX:[id number] DOB: October 04, 1940 DOA: 04/10/2013 PCP: Geraldo Pitter, MD  HPI/Subjective: Feels much better, still has generalized weakness  Assessment/Plan: Principal Problem:   ARF (acute renal failure) Active Problems:   HYPOTHYROIDISM   HYPERLIPIDEMIA   CORONARY ARTERY DISEASE   Hyperkalemia   Hypotension, unspecified   Sinus bradycardia    Acute renal failure -Likely secondary to dehydration with this energy of nephrotoxic medications including Pentasa overall, and Dyazide. -Hold benazepril, triamterene and HCTZ. -Patient started on IV fluids, creatinine is improving nicely. -Patient presents with creatinine of 3.1, her baseline is 1.1.  Hyperkalemia -Present with potassium of 5.9, had a dose of Kayexalate. -Potassium is 4.4 today, no EKG changes.  Sinus bradycardia -Hold beta blockers and calcium channel blockers as well as treat hyperkalemia.  Hypothyroidism -Continue levothyroxine treatment, check TSH.  Hypotension -Likely secondary to volume depletion from discontinuation of antihypertensives and diuretics. -Hydrate aggressively with IV fluids, follow blood pressure closely.  Code Status: Full code Family Communication: Plan discussed with the patient. Disposition Plan: Remains inpatient   Consultants:  None  Procedures:  None  Antibiotics:  None   Objective: Filed Vitals:   04/11/13 1414  BP: 128/48  Pulse: 64  Temp: 98.8 F (37.1 C)  Resp: 20    Intake/Output Summary (Last 24 hours) at 04/11/13 1420 Last data filed at 04/11/13 1300  Gross per 24 hour  Intake    720 ml  Output      0 ml  Net    720 ml   Filed Weights   04/10/13 1321 04/10/13 2054  Weight: 85.73 kg (189 lb) 84.9 kg (187 lb 2.7 oz)    Exam: General: Alert and awake, oriented x3, not in any acute distress. HEENT: anicteric sclera, pupils reactive to light and accommodation, EOMI CVS: S1-S2 clear, no murmur rubs or  gallops Chest: clear to auscultation bilaterally, no wheezing, rales or rhonchi Abdomen: soft nontender, nondistended, normal bowel sounds, no organomegaly Extremities: no cyanosis, clubbing or edema noted bilaterally Neuro: Cranial nerves II-XII intact, no focal neurological deficits  Data Reviewed: Basic Metabolic Panel:  Recent Labs Lab 04/10/13 1345 04/10/13 2305 04/11/13 0616  NA 136 140 136  K 5.9* 5.0 4.4  CL 102 109 107  CO2 22 21 16*  GLUCOSE 112* 91 96  BUN 51* 43* 35*  CREATININE 3.10* 2.14* 1.73*  CALCIUM 10.1 9.2 9.0   Liver Function Tests:  Recent Labs Lab 04/10/13 1345  AST 24  ALT 24  ALKPHOS 120*  BILITOT 0.5  PROT 7.3  ALBUMIN 3.7   No results found for this basename: LIPASE, AMYLASE,  in the last 168 hours No results found for this basename: AMMONIA,  in the last 168 hours CBC:  Recent Labs Lab 04/10/13 1345 04/11/13 0616  WBC 5.8 4.7  NEUTROABS 3.2  --   HGB 10.3* 10.6*  HCT 31.6* 32.0*  MCV 85.2 83.6  PLT 228 212   Cardiac Enzymes:  Recent Labs Lab 04/10/13 1345  TROPONINI <0.30   BNP (last 3 results) No results found for this basename: PROBNP,  in the last 8760 hours CBG: No results found for this basename: GLUCAP,  in the last 168 hours  Micro No results found for this or any previous visit (from the past 240 hour(s)).   Studies: Dg Chest 2 View  04/10/2013   *RADIOLOGY REPORT*  Clinical Data: Fatique, dizziness, coronary artery disease.  CHEST - 2 VIEW  Comparison: April 07, 2012  Findings: There is no focal infiltrate, pulmonary edema, or pleural effusion.  The patient status post prior CABG.  The heart size is normal.  The soft tissues and osseous structures are stable.  IMPRESSION: No acute cardiopulmonary disease identified.   Original Report Authenticated By: Sherian Rein, M.D.    Scheduled Meds: . aspirin  162 mg Oral Daily  . atorvastatin  40 mg Oral Daily  . clopidogrel  75 mg Oral Daily  . enoxaparin  (LOVENOX) injection  30 mg Subcutaneous Q24H  . ezetimibe  10 mg Oral Daily  . levothyroxine  100 mcg Oral QAC breakfast  . multivitamin with minerals  1 tablet Oral Daily  . pantoprazole  40 mg Oral Daily   Continuous Infusions: . sodium chloride Stopped (04/10/13 1606)  . sodium chloride 75 mL/hr at 04/11/13 1337       Time spent: 35 minutes    [facility] A  [facility] Pager [phone] If 7PM-7AM, please contact night-coverage at [email or web address], password [facility] 04/11/2013, 2:20 PM  LOS: 1 day

## 2013-04-12 LAB — BASIC METABOLIC PANEL
BUN: 18 mg/dL (ref 6–23)
CO2: 18 mEq/L — ABNORMAL LOW (ref 19–32)
Calcium: 9.2 mg/dL (ref 8.4–10.5)
Chloride: 110 mEq/L (ref 96–112)
Creatinine, Ser: 1.18 mg/dL — ABNORMAL HIGH (ref 0.50–1.10)
GFR calc Af Amer: 52 mL/min — ABNORMAL LOW (ref >90)
GFR calc non Af Amer: 45 mL/min — ABNORMAL LOW (ref >90)
Glucose, Bld: 86 mg/dL (ref 70–99)
Potassium: 3.9 mEq/L (ref 3.5–5.1)
Sodium: 140 mEq/L (ref 135–145)

## 2013-04-12 LAB — CBC
HCT: 32.1 % — ABNORMAL LOW (ref 36.0–46.0)
Hemoglobin: 10.7 g/dL — ABNORMAL LOW (ref 12.0–15.0)
MCH: 27.9 pg (ref 26.0–34.0)
MCHC: 33.3 g/dL (ref 30.0–36.0)
MCV: 83.6 fL (ref 78.0–100.0)
Platelets: 212 10*3/uL (ref 150–400)
RBC: 3.84 MIL/uL — ABNORMAL LOW (ref 3.87–5.11)
RDW: 15.4 % (ref 11.5–15.5)
WBC: 4.7 10*3/uL (ref 4.0–10.5)

## 2013-04-12 MED ORDER — INFLUENZA VAC SPLIT QUAD 0.5 ML IM SUSP
0.5000 mL | INTRAMUSCULAR | Status: AC
  Administered 2013-04-13: 0.5 mL via INTRAMUSCULAR
  Filled 2013-04-12: qty 0.5

## 2013-04-12 NOTE — Progress Notes (Signed)
[facility] PROGRESS NOTE  ARLISSA MONTEVERDE ZOX:[id number] DOB: 01-15-1941 DOA: 04/10/2013 PCP: Geraldo Pitter, MD  HPI/Subjective: Was scheduled for discharge, discharge postponed because patient continues to have weakness. Had headache after she walked.  Assessment/Plan: Principal Problem:   ARF (acute renal failure) Active Problems:   HYPOTHYROIDISM   HYPERLIPIDEMIA   CORONARY ARTERY DISEASE   Hyperkalemia   Hypotension, unspecified   Sinus bradycardia    Acute renal failure -Likely secondary to dehydration with this energy of nephrotoxic medications including Pentasa overall, and Dyazide. -Hold benazepril, triamterene and HCTZ. -Patient started on IV fluids, creatinine is improving nicely. -Patient presents with creatinine of 3.1, her baseline is 1.1. -Patient is back to her baseline today. Likely not to discharge her home on Dyazide.  Hyperkalemia -Present with potassium of 5.9, had a dose of Kayexalate. -Potassium is 4.4 today, no EKG changes.  Sinus bradycardia -Hold beta blockers and calcium channel blockers as well as treat hyperkalemia.  Hypothyroidism -Continue levothyroxine treatment, check TSH.  Hypotension -Likely secondary to volume depletion from discontinuation of antihypertensives and diuretics. -Hydrate aggressively with IV fluids, follow blood pressure closely.  Code Status: Full code Family Communication: Plan discussed with the patient. Disposition Plan: Remains inpatient   Consultants:  None  Procedures:  None  Antibiotics:  None   Objective: Filed Vitals:   04/12/13 1706  BP: 162/83  Pulse: 65  Temp:   Resp:     Intake/Output Summary (Last 24 hours) at 04/12/13 1953 Last data filed at 04/12/13 1500  Gross per 24 hour  Intake   1300 ml  Output    950 ml  Net    350 ml   Filed Weights   04/10/13 1321 04/10/13 2054 04/11/13 2046  Weight: 85.73 kg (189 lb) 84.9 kg (187 lb 2.7 oz) 84.899 kg (187 lb 2.7 oz)     Exam: General: Alert and awake, oriented x3, not in any acute distress. HEENT: anicteric sclera, pupils reactive to light and accommodation, EOMI CVS: S1-S2 clear, no murmur rubs or gallops Chest: clear to auscultation bilaterally, no wheezing, rales or rhonchi Abdomen: soft nontender, nondistended, normal bowel sounds, no organomegaly Extremities: no cyanosis, clubbing or edema noted bilaterally Neuro: Cranial nerves II-XII intact, no focal neurological deficits  Data Reviewed: Basic Metabolic Panel:  Recent Labs Lab 04/10/13 1345 04/10/13 2305 04/11/13 0616 04/12/13 0737  NA 136 140 136 140  K 5.9* 5.0 4.4 3.9  CL 102 109 107 110  CO2 22 21 16* 18*  GLUCOSE 112* 91 96 86  BUN 51* 43* 35* 18  CREATININE 3.10* 2.14* 1.73* 1.18*  CALCIUM 10.1 9.2 9.0 9.2   Liver Function Tests:  Recent Labs Lab 04/10/13 1345  AST 24  ALT 24  ALKPHOS 120*  BILITOT 0.5  PROT 7.3  ALBUMIN 3.7   No results found for this basename: LIPASE, AMYLASE,  in the last 168 hours No results found for this basename: AMMONIA,  in the last 168 hours CBC:  Recent Labs Lab 04/10/13 1345 04/11/13 0616 04/12/13 0737  WBC 5.8 4.7 4.7  NEUTROABS 3.2  --   --   HGB 10.3* 10.6* 10.7*  HCT 31.6* 32.0* 32.1*  MCV 85.2 83.6 83.6  PLT 228 212 212   Cardiac Enzymes:  Recent Labs Lab 04/10/13 1345  TROPONINI <0.30   BNP (last 3 results) No results found for this basename: PROBNP,  in the last 8760 hours CBG:  Recent Labs Lab 04/11/13 1646  GLUCAP 100*    Micro  No results found for this or any previous visit (from the past 240 hour(s)).   Studies: No results found.  Scheduled Meds: . aspirin  162 mg Oral Daily  . atorvastatin  40 mg Oral Daily  . clopidogrel  75 mg Oral Daily  . enoxaparin (LOVENOX) injection  30 mg Subcutaneous Q24H  . ezetimibe  10 mg Oral Daily  . levothyroxine  100 mcg Oral QAC breakfast  . multivitamin with minerals  1 tablet Oral Daily  . pantoprazole   40 mg Oral Daily   Continuous Infusions: . sodium chloride 100 mL/hr at 04/12/13 1806       Time spent: 35 minutes    [facility] A  [facility] Pager [phone] If 7PM-7AM, please contact night-coverage at [email or web address], password [facility] 04/12/2013, 7:53 PM  LOS: 2 days

## 2013-04-12 NOTE — Discharge Summary (Addendum)
Physician Discharge Summary  [name] ZOX:[id number] DOB: 1941/01/05 DOA: 04/10/2013  PCP: Geraldo Pitter, MD  Admit date: 04/10/2013 Discharge date: 04/13/2013  Time spent: 45 minutes  Recommendations for Outpatient Follow-up:  1. Monitor BP for hyper/hypotension 2. Regular TSH, Cholesterol monitoring  Discharge Diagnoses:  Principal Problem:   ARF (acute renal failure) Active Problems:   HYPOTHYROIDISM   HYPERLIPIDEMIA   CORONARY ARTERY DISEASE   Hyperkalemia   Hypotension, unspecified   Sinus bradycardia   Discharge Condition: Stable  Diet recommendation: Heart healthy  Filed Weights   04/10/13 1321 04/10/13 2054 04/11/13 2046  Weight: 85.73 kg (189 lb) 84.9 kg (187 lb 2.7 oz) 84.899 kg (187 lb 2.7 oz)    History of present illness:  [name] is a 72 y.o. female with multiple medical problems who presented to the [facility] ED with complaints of increased weakness, fatigue, dizziness and lightheaded for the past 3 days. She denied nausea vomiting or diarrhea, or fevers or chills. She also denied having any chest pain or headaches. She was found to have a potassium level of 5.9, and an elevated BUN/Cr of 51/3.10. She was started on IVFs and admitted for inpatient care.    Hospital Course:   Acute renal failure  -Presented with weakness, fatigue, dizziness and lightheadedness.  BMP results concurrent with signs of ARF. -Likely secondary to dehydration with this energy of nephrotoxic medications including Pentasa overall, and Dyazide.  -Held benazepril, triamterene and HCTZ due to ARF  -Patient started on IV fluids, creatinine returned to near baseline-- today (9/22)=1.18.  -Upon discharge restart benazepril only. Do not restart Dyazide.  Hyperkalemia  -Presented with potassium of 5.9, had one dose of Kayexalate on 9/20. -Routine BMPs to monitor electrolytes-- K+ slowly lowered to WNL today at 3.9.   -No EKG changes were noted. -No changes during telemetry  monitoring.  Sinus bradycardia  -Presented with HR in low 40s -Held beta blockers and calcium channel blockers to improve HR -HR today in the 50s-60s. -Patient educated on danger of low HR and BP. -Will follow up with PCP for regular monitoring  Hypothyroidism  -Continue levothyroxine treatment -Continue to monitor with PCP  Hypotension  -Likely secondary to volume depletion from discontinuation of antihypertensives and diuretics.  -Hydrated aggressively with IV fluids with continuous BP monitoring. -Regular blood pressure monitoring is needed to prevent possible syncopal episodes. -BP on day of discharge is 138/66   Discharge Exam: Filed Vitals:   04/13/13 1332  BP: 148/81  Pulse: 61  Temp: 99 F (37.2 C)  Resp: 18    General: Pleasant, well appearing african [place] female resting comfortably in bed. NAD, AAO x3. Cardiovascular: Bradycardic, regular rhythm, no murmurs, gallops or rubs noted.   Respiratory: CTAB without wheezes, rales, rhonchi Abdomen: Soft, nontender, no palpable masses or organomegaly.   Discharge Instructions      Discharge Orders   Future Orders Complete By Expires   Call MD for:  extreme fatigue  As directed    Call MD for:  persistant dizziness or light-headedness  As directed    Diet - low sodium heart healthy  As directed    Increase activity slowly  As directed        Medication List    STOP taking these medications       triamterene-hydrochlorothiazide 37.5-25 MG per capsule  Commonly known as:  DYAZIDE      TAKE these medications       amLODipine-benazepril 10-20 MG per capsule  Commonly  known as:  LOTREL  Take 1 capsule by mouth daily.     aspirin 81 MG tablet  Take 1 tablet (81 mg total) by mouth daily.     atorvastatin 40 MG tablet  Commonly known as:  LIPITOR  Take 40 mg by mouth daily.     carvedilol 12.5 MG tablet  Commonly known as:  COREG  Take 1 tablet (12.5 mg total) by mouth 2 (two) times daily with a  meal.     clopidogrel 75 MG tablet  Commonly known as:  PLAVIX  Take 75 mg by mouth daily.     ezetimibe 10 MG tablet  Commonly known as:  ZETIA  Take 10 mg by mouth daily.     guanFACINE 2 MG tablet  Commonly known as:  TENEX  Take 2 mg by mouth at bedtime.     levothyroxine 125 MCG tablet  Commonly known as:  SYNTHROID, LEVOTHROID  Take 100 mcg by mouth daily.     multivitamin capsule  Take 1 capsule by mouth daily.       Allergies  Allergen Reactions  . Amoxicillin Swelling  . Clarithromycin Swelling   Follow-up Information   Follow up with Geraldo Pitter, MD In 1 week.   Specialty:  Family Medicine   Contact information:   [address] [place] [address] [phone]        The results of significant diagnostics from this hospitalization (including imaging, microbiology, ancillary and laboratory) are listed below for reference.    Significant Diagnostic Studies: Dg Chest 2 View  04/10/2013   *RADIOLOGY REPORT*  Clinical Data: Fatique, dizziness, coronary artery disease.  CHEST - 2 VIEW  Comparison: April 07, 2012  Findings: There is no focal infiltrate, pulmonary edema, or pleural effusion.  The patient status post prior CABG.  The heart size is normal.  The soft tissues and osseous structures are stable.  IMPRESSION: No acute cardiopulmonary disease identified.   Original Report Authenticated By: Sherian Rein, M.D.     Labs: Basic Metabolic Panel:  Recent Labs Lab 04/10/13 1345 04/10/13 2305 04/11/13 0616 04/12/13 0737 04/13/13 0455  NA 136 140 136 140 140  K 5.9* 5.0 4.4 3.9 3.5  CL 102 109 107 110 108  CO2 22 21 16* 18* 19  GLUCOSE 112* 91 96 86 87  BUN 51* 43* 35* 18 16  CREATININE 3.10* 2.14* 1.73* 1.18* 1.18*  CALCIUM 10.1 9.2 9.0 9.2 9.1   Liver Function Tests:  Recent Labs Lab 04/10/13 1345  AST 24  ALT 24  ALKPHOS 120*  BILITOT 0.5  PROT 7.3  ALBUMIN 3.7   CBC:  Recent Labs Lab 04/10/13 1345 04/11/13 0616  04/12/13 0737  WBC 5.8 4.7 4.7  NEUTROABS 3.2  --   --   HGB 10.3* 10.6* 10.7*  HCT 31.6* 32.0* 32.1*  MCV 85.2 83.6 83.6  PLT 228 212 212   Cardiac Enzymes:  Recent Labs Lab 04/10/13 1345  TROPONINI <0.30   CBG:  Recent Labs Lab 04/11/13 1646  GLUCAP 100*       Signed:  Sherlynn Carbon, Physician Assistant Student  [facility] 04/13/2013, 2:08 PM   Addendum  Patient seen and examined, chart and data base reviewed.  I agree with the above assessment and plan.  For full details please see Cathi Roan PA-S note.  I did review and addended for above note.   Clint Lipps, MD [facility] Pager: [phone] 04/13/2013,  2:08 PM

## 2013-04-12 NOTE — Progress Notes (Signed)
Patient ambulated in the hallway. BP after ambulating 163/83 HR 65. Patient complained of headache after walking.

## 2013-04-13 DIAGNOSIS — K219 Gastro-esophageal reflux disease without esophagitis: Secondary | ICD-10-CM

## 2013-04-13 LAB — BASIC METABOLIC PANEL
BUN: 16 mg/dL (ref 6–23)
CO2: 19 mEq/L (ref 19–32)
Calcium: 9.1 mg/dL (ref 8.4–10.5)
Chloride: 108 mEq/L (ref 96–112)
Creatinine, Ser: 1.18 mg/dL — ABNORMAL HIGH (ref 0.50–1.10)
GFR calc Af Amer: 52 mL/min — ABNORMAL LOW (ref >90)
GFR calc non Af Amer: 45 mL/min — ABNORMAL LOW (ref >90)
Glucose, Bld: 87 mg/dL (ref 70–99)
Potassium: 3.5 mEq/L (ref 3.5–5.1)
Sodium: 140 mEq/L (ref 135–145)

## 2013-04-13 MED ORDER — ASPIRIN EC 81 MG PO TBEC
162.0000 mg | DELAYED_RELEASE_TABLET | Freq: Every day | ORAL | Status: DC
  Administered 2013-04-13: 162 mg via ORAL
  Filled 2013-04-13: qty 2

## 2013-04-13 NOTE — Progress Notes (Signed)
Patient was discharged home with family. Patient was given discharge instructions and information on medications. Patient was given a work excuse from doctor. Patient was educated on bradycardia and hypotension. Patient was told to specifically to slowly move from laying to sitting to standing position. Patient was stable upon discharge.

## 2013-08-25 ENCOUNTER — Ambulatory Visit
Admission: RE | Admit: 2013-08-25 | Discharge: 2013-08-25 | Disposition: A | Discharge status: Home / Self Care | Payer: Medicare Other | Source: Ambulatory Visit | Attending: Orthopedic Surgery | Admitting: Orthopedic Surgery

## 2013-08-25 ENCOUNTER — Other Ambulatory Visit: Payer: Self-pay | Admitting: Orthopedic Surgery

## 2013-08-25 DIAGNOSIS — M7542 Impingement syndrome of left shoulder: Secondary | ICD-10-CM

## 2013-08-25 DIAGNOSIS — M5430 Sciatica, unspecified side: Secondary | ICD-10-CM

## 2013-11-01 ENCOUNTER — Encounter (HOSPITAL_BASED_OUTPATIENT_CLINIC_OR_DEPARTMENT_OTHER): Payer: Self-pay | Admitting: Emergency Medicine

## 2013-11-01 ENCOUNTER — Emergency Department (HOSPITAL_BASED_OUTPATIENT_CLINIC_OR_DEPARTMENT_OTHER)
Admission: EM | Admit: 2013-11-01 | Discharge: 2013-11-01 | Disposition: A | Discharge status: Home / Self Care | Payer: Medicare Other | Attending: Emergency Medicine | Admitting: Emergency Medicine

## 2013-11-01 DIAGNOSIS — E669 Obesity, unspecified: Secondary | ICD-10-CM | POA: Insufficient documentation

## 2013-11-01 DIAGNOSIS — I2510 Atherosclerotic heart disease of native coronary artery without angina pectoris: Secondary | ICD-10-CM | POA: Insufficient documentation

## 2013-11-01 DIAGNOSIS — M109 Gout, unspecified: Secondary | ICD-10-CM

## 2013-11-01 DIAGNOSIS — E039 Hypothyroidism, unspecified: Secondary | ICD-10-CM | POA: Insufficient documentation

## 2013-11-01 DIAGNOSIS — Z853 Personal history of malignant neoplasm of breast: Secondary | ICD-10-CM | POA: Insufficient documentation

## 2013-11-01 DIAGNOSIS — Z7902 Long term (current) use of antithrombotics/antiplatelets: Secondary | ICD-10-CM | POA: Insufficient documentation

## 2013-11-01 DIAGNOSIS — I10 Essential (primary) hypertension: Secondary | ICD-10-CM | POA: Insufficient documentation

## 2013-11-01 DIAGNOSIS — Z880 Allergy status to penicillin: Secondary | ICD-10-CM | POA: Insufficient documentation

## 2013-11-01 DIAGNOSIS — Z8719 Personal history of other diseases of the digestive system: Secondary | ICD-10-CM | POA: Insufficient documentation

## 2013-11-01 DIAGNOSIS — Z7982 Long term (current) use of aspirin: Secondary | ICD-10-CM | POA: Insufficient documentation

## 2013-11-01 DIAGNOSIS — Z87891 Personal history of nicotine dependence: Secondary | ICD-10-CM | POA: Insufficient documentation

## 2013-11-01 DIAGNOSIS — Z8739 Personal history of other diseases of the musculoskeletal system and connective tissue: Secondary | ICD-10-CM | POA: Insufficient documentation

## 2013-11-01 DIAGNOSIS — Z79899 Other long term (current) drug therapy: Secondary | ICD-10-CM | POA: Insufficient documentation

## 2013-11-01 DIAGNOSIS — Z951 Presence of aortocoronary bypass graft: Secondary | ICD-10-CM | POA: Insufficient documentation

## 2013-11-01 DIAGNOSIS — E785 Hyperlipidemia, unspecified: Secondary | ICD-10-CM | POA: Insufficient documentation

## 2013-11-01 MED ORDER — HYDROCODONE-ACETAMINOPHEN 5-325 MG PO TABS: 1.0000 | ORAL_TABLET | ORAL | Status: DC | PRN

## 2013-11-01 MED ORDER — PREDNISONE 20 MG PO TABS: ORAL_TABLET | ORAL | Status: DC

## 2013-11-01 MED ORDER — PREDNISONE 20 MG PO TABS
40.0000 mg | ORAL_TABLET | Freq: Once | ORAL | Status: AC
  Administered 2013-11-01: 40 mg via ORAL
  Filled 2013-11-01: qty 2

## 2013-11-01 NOTE — ED Notes (Signed)
Pain in her right great toe since last night. Hx of gout.

## 2013-11-01 NOTE — Discharge Instructions (Signed)

## 2013-11-01 NOTE — ED Provider Notes (Signed)
CSN: [id number]     Arrival date & time 11/01/13  2000 History   This chart was scribed for Malvin Johns, MD by Celesta Gentile, ED Scribe. The patient was seen in room MH04/MH04. Patient's care was started at 9:41 PM.    Chief Complaint  Patient presents with  . Gout   The history is provided by the patient. No language interpreter was used.   HPI Comments: [name] is a 73 y.o. female with a h/o gout presents to the Emergency Department complaining of pain in her right great toe that started last night.  Pt states she hasn't had a gout flare up in about 3 years.  She states this pain is similar to previous episodes.  She states she normally takes prednisone with relief.  She reports she tried ibuprofen today without relief.  Pt states she usually takes pain medication along with the prednisone, but isn't sure which one she normally takes.  Pt reports she has a PCP.    Past Medical History  Diagnosis Date  . CAD (coronary artery disease)     s/p CABG  . HLD (hyperlipidemia)   . HTN (hypertension)   . Hypothyroidism   . Gout   . Osteoporosis   . Breast cancer     s/p mastectomy  . GERD (gastroesophageal reflux disease)   . PVD (peripheral vascular disease)   . Low back pain   . DDD (degenerative disc disease), lumbar   . Obesity   . Discoid lupus   . OSA (obstructive sleep apnea)     did not tolerate CPAP  . Dyspnea    Past Surgical History  Procedure Laterality Date  . Hysterectomy - unknown type    . Ankle surgery  7/05    left  . Mastectomy  1985    right  . Coronary artery bypass graft  1997  . Renal artery stent    . Oophorectomy    . Elbow surgery      right   Family History  Problem Relation Age of Onset  . Coronary artery disease    . Diabetes    . Hypertension    . Aneurysm    . Stroke     History  Substance Use Topics  . Smoking status: Former Smoker    Quit date: 07/23/1995  . Smokeless tobacco: Not on file  . Alcohol Use: No   OB History    Grav Para Term Preterm Abortions TAB SAB Ect Mult Living                 Review of Systems  Constitutional: Negative for fever.  Gastrointestinal: Negative for nausea and vomiting.  Musculoskeletal: Positive for arthralgias and joint swelling. Negative for back pain and neck pain.  Skin: Negative for wound.  Neurological: Negative for weakness, numbness and headaches.    Allergies  Amoxicillin and Clarithromycin  Home Medications   Current Outpatient Rx  Name  Route  Sig  Dispense  Refill  . amLODipine-benazepril (LOTREL) 10-20 MG per capsule   Oral   Take 1 capsule by mouth daily.           Marland Kitchen aspirin 81 MG tablet   Oral   Take 1 tablet (81 mg total) by mouth daily.   30 tablet   6   . atorvastatin (LIPITOR) 40 MG tablet   Oral   Take 40 mg by mouth daily.           Marland Kitchen  carvedilol (COREG) 12.5 MG tablet   Oral   Take 1 tablet (12.5 mg total) by mouth 2 (two) times daily with a meal.   60 tablet   0     Needs to schedule office visit(2)   . clopidogrel (PLAVIX) 75 MG tablet   Oral   Take 75 mg by mouth daily.           Marland Kitchen ezetimibe (ZETIA) 10 MG tablet   Oral   Take 10 mg by mouth daily.         Marland Kitchen guanFACINE (TENEX) 2 MG tablet   Oral   Take 2 mg by mouth at bedtime.           Marland Kitchen HYDROcodone-acetaminophen (NORCO/VICODIN) 5-325 MG per tablet   Oral   Take 1-2 tablets by mouth every 4 (four) hours as needed for moderate pain.   20 tablet   0   . levothyroxine (SYNTHROID, LEVOTHROID) 125 MCG tablet   Oral   Take 100 mcg by mouth daily.          . Multiple Vitamin (MULTIVITAMIN) capsule   Oral   Take 1 capsule by mouth daily.         . predniSONE (DELTASONE) 20 MG tablet      1 tabs po daily x 4 days   4 tablet   0    Triage Vitals: BP 145/67  Pulse 66  Temp(Src) 98.3 F (36.8 C) (Oral)  Resp 20  Ht 5\' 5"  (1.651 m)  Wt 197 lb (89.359 kg)  BMI 32.78 kg/m2  SpO2 96%  Physical Exam  Constitutional: She is oriented to person,  place, and time. She appears well-developed and well-nourished.  HENT:  Head: Normocephalic and atraumatic.  Neck: Normal range of motion. Neck supple.  Cardiovascular: Normal rate.   Pulmonary/Chest: Effort normal.  Musculoskeletal: She exhibits edema and tenderness.  Mild erythema, swelling of right big toe, at MTP joint.  No wounds.  No other surrounding pain or erythema  Neurological: She is alert and oriented to person, place, and time.  Skin: Skin is warm and dry.  Psychiatric: She has a normal mood and affect.    ED Course  Procedures (including critical care time) DIAGNOSTIC STUDIES: Oxygen Saturation is 96% on RA, adequate by my interpretation.    COORDINATION OF CARE: 9:44 PM-Will discharge with prednisone and Vicodin.  Patient informed of current plan of treatment and evaluation and agrees with plan.    MDM   Final diagnoses:  Gout   PT started on 5 day course of prednisone, hydrocodone, will f/u with PMD if symptoms not improving.  Symptoms seem most consistent with her gout flare-ups.  Less likely to be infectious.  I personally performed the services described in this documentation, which was scribed in my presence.  The recorded information has been reviewed and considered.       Malvin Johns, MD 11/01/13 [phone]

## 2014-05-16 ENCOUNTER — Encounter (HOSPITAL_BASED_OUTPATIENT_CLINIC_OR_DEPARTMENT_OTHER): Payer: Self-pay | Admitting: Emergency Medicine

## 2014-05-16 ENCOUNTER — Emergency Department (HOSPITAL_BASED_OUTPATIENT_CLINIC_OR_DEPARTMENT_OTHER)
Admission: EM | Admit: 2014-05-16 | Discharge: 2014-05-16 | Disposition: A | Discharge status: Home / Self Care | Payer: Medicare Other | Attending: Emergency Medicine | Admitting: Emergency Medicine

## 2014-05-16 DIAGNOSIS — E785 Hyperlipidemia, unspecified: Secondary | ICD-10-CM | POA: Insufficient documentation

## 2014-05-16 DIAGNOSIS — Z9011 Acquired absence of right breast and nipple: Secondary | ICD-10-CM | POA: Diagnosis not present

## 2014-05-16 DIAGNOSIS — Z951 Presence of aortocoronary bypass graft: Secondary | ICD-10-CM | POA: Insufficient documentation

## 2014-05-16 DIAGNOSIS — E039 Hypothyroidism, unspecified: Secondary | ICD-10-CM | POA: Diagnosis not present

## 2014-05-16 DIAGNOSIS — Z8719 Personal history of other diseases of the digestive system: Secondary | ICD-10-CM | POA: Insufficient documentation

## 2014-05-16 DIAGNOSIS — Z9981 Dependence on supplemental oxygen: Secondary | ICD-10-CM | POA: Diagnosis not present

## 2014-05-16 DIAGNOSIS — Z79899 Other long term (current) drug therapy: Secondary | ICD-10-CM | POA: Diagnosis not present

## 2014-05-16 DIAGNOSIS — Z853 Personal history of malignant neoplasm of breast: Secondary | ICD-10-CM | POA: Diagnosis not present

## 2014-05-16 DIAGNOSIS — Z87891 Personal history of nicotine dependence: Secondary | ICD-10-CM | POA: Insufficient documentation

## 2014-05-16 DIAGNOSIS — G4733 Obstructive sleep apnea (adult) (pediatric): Secondary | ICD-10-CM | POA: Diagnosis not present

## 2014-05-16 DIAGNOSIS — Z7982 Long term (current) use of aspirin: Secondary | ICD-10-CM | POA: Insufficient documentation

## 2014-05-16 DIAGNOSIS — Z872 Personal history of diseases of the skin and subcutaneous tissue: Secondary | ICD-10-CM | POA: Insufficient documentation

## 2014-05-16 DIAGNOSIS — I2510 Atherosclerotic heart disease of native coronary artery without angina pectoris: Secondary | ICD-10-CM | POA: Insufficient documentation

## 2014-05-16 DIAGNOSIS — W92XXXA Exposure to excessive heat of man-made origin, initial encounter: Secondary | ICD-10-CM | POA: Insufficient documentation

## 2014-05-16 DIAGNOSIS — Z8739 Personal history of other diseases of the musculoskeletal system and connective tissue: Secondary | ICD-10-CM | POA: Insufficient documentation

## 2014-05-16 DIAGNOSIS — E860 Dehydration: Secondary | ICD-10-CM | POA: Insufficient documentation

## 2014-05-16 DIAGNOSIS — Z7902 Long term (current) use of antithrombotics/antiplatelets: Secondary | ICD-10-CM | POA: Diagnosis not present

## 2014-05-16 DIAGNOSIS — I739 Peripheral vascular disease, unspecified: Secondary | ICD-10-CM | POA: Insufficient documentation

## 2014-05-16 DIAGNOSIS — G43919 Migraine, unspecified, intractable, without status migrainosus: Secondary | ICD-10-CM | POA: Insufficient documentation

## 2014-05-16 DIAGNOSIS — I10 Essential (primary) hypertension: Secondary | ICD-10-CM | POA: Insufficient documentation

## 2014-05-16 DIAGNOSIS — Y9389 Activity, other specified: Secondary | ICD-10-CM | POA: Insufficient documentation

## 2014-05-16 DIAGNOSIS — Y9289 Other specified places as the place of occurrence of the external cause: Secondary | ICD-10-CM | POA: Diagnosis not present

## 2014-05-16 DIAGNOSIS — M109 Gout, unspecified: Secondary | ICD-10-CM | POA: Insufficient documentation

## 2014-05-16 DIAGNOSIS — E669 Obesity, unspecified: Secondary | ICD-10-CM | POA: Diagnosis not present

## 2014-05-16 DIAGNOSIS — R350 Frequency of micturition: Secondary | ICD-10-CM | POA: Diagnosis present

## 2014-05-16 DIAGNOSIS — T675XXA Heat exhaustion, unspecified, initial encounter: Secondary | ICD-10-CM

## 2014-05-16 DIAGNOSIS — Z880 Allergy status to penicillin: Secondary | ICD-10-CM | POA: Insufficient documentation

## 2014-05-16 HISTORY — DX: Disorder of kidney and ureter, unspecified: N28.9

## 2014-05-16 LAB — URINALYSIS, ROUTINE W REFLEX MICROSCOPIC
Bilirubin Urine: NEGATIVE
Glucose, UA: NEGATIVE mg/dL
Hgb urine dipstick: NEGATIVE
Ketones, ur: NEGATIVE mg/dL
Nitrite: NEGATIVE
Protein, ur: NEGATIVE mg/dL
Specific Gravity, Urine: 1.011 (ref 1.005–1.030)
Urobilinogen, UA: 1 mg/dL (ref 0.0–1.0)
pH: 6.5 (ref 5.0–8.0)

## 2014-05-16 LAB — CBC WITH DIFFERENTIAL/PLATELET
Basophils Absolute: 0 10*3/uL (ref 0.0–0.1)
Basophils Relative: 1 % (ref 0–1)
Eosinophils Absolute: 0.2 10*3/uL (ref 0.0–0.7)
Eosinophils Relative: 3 % (ref 0–5)
HCT: 43.2 % (ref 36.0–46.0)
Hemoglobin: 14.1 g/dL (ref 12.0–15.0)
Lymphocytes Relative: 27 % (ref 12–46)
Lymphs Abs: 1.6 10*3/uL (ref 0.7–4.0)
MCH: 27.8 pg (ref 26.0–34.0)
MCHC: 32.6 g/dL (ref 30.0–36.0)
MCV: 85.2 fL (ref 78.0–100.0)
Monocytes Absolute: 0.5 10*3/uL (ref 0.1–1.0)
Monocytes Relative: 8 % (ref 3–12)
Neutro Abs: 3.8 10*3/uL (ref 1.7–7.7)
Neutrophils Relative %: 61 % (ref 43–77)
Platelets: 251 10*3/uL (ref 150–400)
RBC: 5.07 MIL/uL (ref 3.87–5.11)
RDW: 14.6 % (ref 11.5–15.5)
WBC: 6.1 10*3/uL (ref 4.0–10.5)

## 2014-05-16 LAB — COMPREHENSIVE METABOLIC PANEL
ALT: 35 U/L (ref 0–35)
AST: 38 U/L — ABNORMAL HIGH (ref 0–37)
Albumin: 3.9 g/dL (ref 3.5–5.2)
Alkaline Phosphatase: 160 U/L — ABNORMAL HIGH (ref 39–117)
Anion gap: 13 (ref 5–15)
BUN: 14 mg/dL (ref 6–23)
CO2: 26 mEq/L (ref 19–32)
Calcium: 10.5 mg/dL (ref 8.4–10.5)
Chloride: 104 mEq/L (ref 96–112)
Creatinine, Ser: 1 mg/dL (ref 0.50–1.10)
GFR calc Af Amer: 63 mL/min — ABNORMAL LOW (ref >90)
GFR calc non Af Amer: 54 mL/min — ABNORMAL LOW (ref >90)
Glucose, Bld: 113 mg/dL — ABNORMAL HIGH (ref 70–99)
Potassium: 4.2 mEq/L (ref 3.7–5.3)
Sodium: 143 mEq/L (ref 137–147)
Total Bilirubin: 0.8 mg/dL (ref 0.3–1.2)
Total Protein: 8.2 g/dL (ref 6.0–8.3)

## 2014-05-16 LAB — URINE MICROSCOPIC-ADD ON

## 2014-05-16 MED ORDER — SODIUM CHLORIDE 0.9 % IV BOLUS (SEPSIS)
1000.0000 mL | Freq: Once | INTRAVENOUS | Status: AC
  Administered 2014-05-16: 1000 mL via INTRAVENOUS

## 2014-05-16 MED ORDER — KETOROLAC TROMETHAMINE 30 MG/ML IJ SOLN
15.0000 mg | Freq: Once | INTRAMUSCULAR | Status: AC
  Administered 2014-05-16: 15 mg via INTRAVENOUS
  Filled 2014-05-16: qty 1

## 2014-05-16 MED ORDER — ACETAMINOPHEN 325 MG PO TABS
650.0000 mg | ORAL_TABLET | Freq: Once | ORAL | Status: AC
  Administered 2014-05-16: 650 mg via ORAL
  Filled 2014-05-16: qty 2

## 2014-05-16 MED ORDER — MAGNESIUM SULFATE 50 % IJ SOLN
2.0000 g | Freq: Once | INTRAMUSCULAR | Status: AC
  Administered 2014-05-16: 2 g via INTRAVENOUS
  Filled 2014-05-16: qty 4

## 2014-05-16 MED ORDER — MAGNESIUM SULFATE 50 % IJ SOLN
INTRAMUSCULAR | Status: AC
  Administered 2014-05-16: 2 g via INTRAVENOUS
  Filled 2014-05-16: qty 2

## 2014-05-16 NOTE — Discharge Instructions (Signed)

## 2014-05-16 NOTE — ED Provider Notes (Addendum)
CSN: [id number]     Arrival date & time 05/16/14  0745 History   First MD Initiated Contact with Patient 05/16/14 [phone]     Chief Complaint  Patient presents with  . Burning Eyes  . Urinary Frequency     (Consider location/radiation/quality/duration/timing/severity/associated sxs/prior Treatment) Patient is a 73 y.o. female presenting with headaches. The history is provided by the patient.  Headache Pain location:  Generalized (more pronounced behind her eyes) Quality:  Dull Radiates to:  Does not radiate Severity currently:  8/10 Severity at highest:  8/10 Onset quality:  Gradual Duration:  3 days Timing:  Constant Progression:  Worsening Chronicity:  New Similar to prior headaches: she has had a headache this bad before but never one that lasted this long.   Context comment:  Pt was sitting in a hot car on friday for 3 hours waiting for her granddaughter at school when the HA started and then went to a football game on sat out in the sun and the headache has progressively gotten worse and now feels like her eyelids are burning Relieved by:  Nothing Worsened by:  Nothing tried Ineffective treatments:  NSAIDs Associated symptoms: fatigue and nausea   Associated symptoms: no abdominal pain, no congestion, no cough, no diarrhea, no dizziness, no ear pain, no focal weakness, no hearing loss, no loss of balance, no neck stiffness, no numbness, no photophobia, no visual change, no vomiting and no weakness   Associated symptoms comment:  No continuous blurry vision.  occassional but she states that is not unusual.  She thought that maybe she was dehydrated and started drinking fluids yesterday but still had HA this morning and came to be checked out   Past Medical History  Diagnosis Date  . CAD (coronary artery disease)     s/p CABG  . HLD (hyperlipidemia)   . HTN (hypertension)   . Hypothyroidism   . Gout   . Osteoporosis   . Breast cancer     s/p mastectomy  . GERD  (gastroesophageal reflux disease)   . PVD (peripheral vascular disease)   . Low back pain   . DDD (degenerative disc disease), lumbar   . Obesity   . Discoid lupus   . OSA (obstructive sleep apnea)     did not tolerate CPAP  . Dyspnea   . Renal disorder    Past Surgical History  Procedure Laterality Date  . Hysterectomy - unknown type    . Ankle surgery  7/05    left  . Mastectomy  1985    right  . Coronary artery bypass graft  1997  . Renal artery stent    . Oophorectomy    . Elbow surgery      right   Family History  Problem Relation Age of Onset  . Coronary artery disease    . Diabetes    . Hypertension    . Aneurysm    . Stroke     History  Substance Use Topics  . Smoking status: Former Smoker    Quit date: 07/23/1995  . Smokeless tobacco: Not on file  . Alcohol Use: No   OB History   Grav Para Term Preterm Abortions TAB SAB Ect Mult Living                 Review of Systems  Constitutional: Positive for fatigue.  HENT: Negative for congestion, ear pain and hearing loss.   Eyes: Negative for photophobia.  Respiratory: Negative for cough.  Gastrointestinal: Positive for nausea. Negative for vomiting, abdominal pain and diarrhea.  Musculoskeletal: Negative for neck stiffness.  Neurological: Positive for headaches. Negative for dizziness, focal weakness, numbness and loss of balance.  All other systems reviewed and are negative.     Allergies  Amoxicillin and Clarithromycin  Home Medications   Prior to Admission medications   Medication Sig Start Date End Date Taking? Authorizing Provider  amLODipine-benazepril (LOTREL) 10-20 MG per capsule Take 1 capsule by mouth daily.      Historical Provider, MD  aspirin 81 MG tablet Take 1 tablet (81 mg total) by mouth daily. 12/10/10   Larey Dresser, MD  atorvastatin (LIPITOR) 40 MG tablet Take 40 mg by mouth daily.      Historical Provider, MD  carvedilol (COREG) 12.5 MG tablet Take 1 tablet (12.5 mg total)  by mouth 2 (two) times daily with a meal. 04/05/13   Larey Dresser, MD  clopidogrel (PLAVIX) 75 MG tablet Take 75 mg by mouth daily.      Historical Provider, MD  ezetimibe (ZETIA) 10 MG tablet Take 10 mg by mouth daily.    Historical Provider, MD  guanFACINE (TENEX) 2 MG tablet Take 2 mg by mouth at bedtime.      Historical Provider, MD  HYDROcodone-acetaminophen (NORCO/VICODIN) 5-325 MG per tablet Take 1-2 tablets by mouth every 4 (four) hours as needed for moderate pain. 11/01/13   Malvin Johns, MD  levothyroxine (SYNTHROID, LEVOTHROID) 125 MCG tablet Take 100 mcg by mouth daily.     Historical Provider, MD  Multiple Vitamin (MULTIVITAMIN) capsule Take 1 capsule by mouth daily.    Historical Provider, MD  predniSONE (DELTASONE) 20 MG tablet 1 tabs po daily x 4 days 11/01/13   Malvin Johns, MD   BP 172/85  Pulse 82  Temp(Src) 98.2 F (36.8 C) (Oral)  Resp 18  Ht 5\' 6"  (1.676 m)  Wt 190 lb (86.183 kg)  BMI 30.68 kg/m2  SpO2 98% Physical Exam  Nursing note and vitals reviewed. Constitutional: She is oriented to person, place, and time. She appears well-developed and well-nourished. No distress.  HENT:  Head: Normocephalic and atraumatic.  Right Ear: Tympanic membrane and ear canal normal.  Left Ear: Tympanic membrane and ear canal normal.  Eyes: EOM are normal. Pupils are equal, round, and reactive to light.  Fundoscopic exam:      The right eye shows no papilledema.       The left eye shows no papilledema.  Neck: Normal range of motion. Neck supple.  Cardiovascular: Normal rate, regular rhythm, normal heart sounds and intact distal pulses.  Exam reveals no friction rub.   No murmur heard. Pulmonary/Chest: Effort normal and breath sounds normal. She has no wheezes. She has no rales.  well healed sternotomy scar  Abdominal: Soft. Bowel sounds are normal. She exhibits no distension. There is no tenderness. There is no rebound and no guarding.  Musculoskeletal: Normal range of  motion. She exhibits no tenderness.  No edema  Lymphadenopathy:    She has no cervical adenopathy.  Neurological: She is alert and oriented to person, place, and time. She has normal strength. No cranial nerve deficit or sensory deficit. Coordination and gait normal.  No photophobia.  Pt walked into department with normal gait  Skin: Skin is warm and dry. No rash noted.  Psychiatric: She has a normal mood and affect. Her behavior is normal.    ED Course  Procedures (including critical care time) Labs Review Labs Reviewed  URINALYSIS, ROUTINE W REFLEX MICROSCOPIC - Abnormal; Notable for the following:    Leukocytes, UA TRACE (*)    All other components within normal limits  COMPREHENSIVE METABOLIC PANEL - Abnormal; Notable for the following:    Glucose, Bld 113 (*)    AST 38 (*)    Alkaline Phosphatase 160 (*)    GFR calc non Af Amer 54 (*)    GFR calc Af Amer 63 (*)    All other components within normal limits  URINE MICROSCOPIC-ADD ON - Abnormal; Notable for the following:    Squamous Epithelial / LPF FEW (*)    Bacteria, UA FEW (*)    All other components within normal limits  URINE CULTURE  CBC WITH DIFFERENTIAL    Imaging Review No results found.   EKG Interpretation None      MDM   Final diagnoses:  Intractable migraine without status migrainosus, unspecified migraine type  Heat exhaustion, initial encounter  Dehydration   Patient presents due to a persistent headache that started 3 days ago after she was sitting in hot car for 3 hours on Friday.  Saturday she was at a football game all day and was also outside in the sun. She tried ibuprofen 1 tablet once for the headache which did not help so she has not tried anything else for the pain. She thought she may be dehydrated so yesterday started drinking more water however the headache has continued.  She has had a headache similar to this in intensity before but states has never had a headache that lasted this long.  Makes her slightly nauseated but she denies any vomiting or fever.  Patient's exam is within normal limits without signs of neuro deficits. Suspicion for subarachnoid hemorrhages headache has been gradual in onset and is not the worst of her life. Low suspicion for stroke at this time this patient has no focal deficits and denies any symptoms concerning for stroke. Patient does take Plavix however she denies any trauma. Feel most likely headache was triggered by heat exhaustion and most likely dehydration.  CBC, CMP, UA pending. Patient given IV fluids and Tylenol and will recheck  9:01 AM CBC and CMP wnl.  UA contaminated and cultures done.  Feel most likely pt has a migraine triggered by dehydration and heat exhaustion.    10:29 AM Headache is not improved with fluids and tylenol.  Will try mag and toradol  11:47 AM HA is much improved 3/10 and pt is ready to go home.  Blanchie Dessert, MD 05/16/14 [place], MD 05/16/14 [place], MD 05/16/14 1149

## 2014-05-16 NOTE — ED Notes (Signed)
Pt c/o bilateral upper eye lids burning since yesterday, denies discharge, denies visual changes at present but sts blurred vision earlier today. Pt also c/o "fatigue" x 2 days. Pt sts she started drinking a lot of water yesterday and then urination increased in frequency. Denies burning, blood, incontinence.

## 2014-05-17 LAB — URINE CULTURE: Colony Count: 70000

## 2014-09-09 DIAGNOSIS — K7290 Hepatic failure, unspecified without coma: Secondary | ICD-10-CM | POA: Diagnosis not present

## 2014-09-09 DIAGNOSIS — E119 Type 2 diabetes mellitus without complications: Secondary | ICD-10-CM | POA: Diagnosis not present

## 2014-09-09 DIAGNOSIS — M940 Chondrocostal junction syndrome [Tietze]: Secondary | ICD-10-CM | POA: Diagnosis not present

## 2014-09-09 DIAGNOSIS — E0800 Diabetes mellitus due to underlying condition with hyperosmolarity without nonketotic hyperglycemic-hyperosmolar coma (NKHHC): Secondary | ICD-10-CM | POA: Diagnosis not present

## 2014-09-09 DIAGNOSIS — N390 Urinary tract infection, site not specified: Secondary | ICD-10-CM | POA: Diagnosis not present

## 2014-09-09 DIAGNOSIS — I10 Essential (primary) hypertension: Secondary | ICD-10-CM | POA: Diagnosis not present

## 2014-09-22 DIAGNOSIS — I10 Essential (primary) hypertension: Secondary | ICD-10-CM | POA: Diagnosis not present

## 2014-09-22 DIAGNOSIS — R945 Abnormal results of liver function studies: Secondary | ICD-10-CM | POA: Diagnosis not present

## 2014-09-22 DIAGNOSIS — M940 Chondrocostal junction syndrome [Tietze]: Secondary | ICD-10-CM | POA: Diagnosis not present

## 2014-12-22 ENCOUNTER — Encounter (HOSPITAL_BASED_OUTPATIENT_CLINIC_OR_DEPARTMENT_OTHER): Payer: Self-pay | Admitting: *Deleted

## 2014-12-22 ENCOUNTER — Emergency Department (HOSPITAL_BASED_OUTPATIENT_CLINIC_OR_DEPARTMENT_OTHER)
Admission: EM | Admit: 2014-12-22 | Discharge: 2014-12-22 | Disposition: A | Discharge status: Home / Self Care | Payer: Medicare Other | Attending: Emergency Medicine | Admitting: Emergency Medicine

## 2014-12-22 ENCOUNTER — Emergency Department (HOSPITAL_BASED_OUTPATIENT_CLINIC_OR_DEPARTMENT_OTHER): Payer: Medicare Other

## 2014-12-22 DIAGNOSIS — I10 Essential (primary) hypertension: Secondary | ICD-10-CM | POA: Diagnosis not present

## 2014-12-22 DIAGNOSIS — M549 Dorsalgia, unspecified: Secondary | ICD-10-CM

## 2014-12-22 DIAGNOSIS — Z872 Personal history of diseases of the skin and subcutaneous tissue: Secondary | ICD-10-CM | POA: Insufficient documentation

## 2014-12-22 DIAGNOSIS — W1830XA Fall on same level, unspecified, initial encounter: Secondary | ICD-10-CM | POA: Diagnosis not present

## 2014-12-22 DIAGNOSIS — Y9389 Activity, other specified: Secondary | ICD-10-CM | POA: Insufficient documentation

## 2014-12-22 DIAGNOSIS — E669 Obesity, unspecified: Secondary | ICD-10-CM | POA: Diagnosis not present

## 2014-12-22 DIAGNOSIS — Z853 Personal history of malignant neoplasm of breast: Secondary | ICD-10-CM | POA: Diagnosis not present

## 2014-12-22 DIAGNOSIS — Z880 Allergy status to penicillin: Secondary | ICD-10-CM | POA: Insufficient documentation

## 2014-12-22 DIAGNOSIS — Y998 Other external cause status: Secondary | ICD-10-CM | POA: Diagnosis not present

## 2014-12-22 DIAGNOSIS — I2510 Atherosclerotic heart disease of native coronary artery without angina pectoris: Secondary | ICD-10-CM | POA: Diagnosis not present

## 2014-12-22 DIAGNOSIS — Y92481 Parking lot as the place of occurrence of the external cause: Secondary | ICD-10-CM | POA: Insufficient documentation

## 2014-12-22 DIAGNOSIS — Z951 Presence of aortocoronary bypass graft: Secondary | ICD-10-CM | POA: Diagnosis not present

## 2014-12-22 DIAGNOSIS — Z79899 Other long term (current) drug therapy: Secondary | ICD-10-CM | POA: Insufficient documentation

## 2014-12-22 DIAGNOSIS — S32000A Wedge compression fracture of unspecified lumbar vertebra, initial encounter for closed fracture: Secondary | ICD-10-CM

## 2014-12-22 DIAGNOSIS — E039 Hypothyroidism, unspecified: Secondary | ICD-10-CM | POA: Insufficient documentation

## 2014-12-22 DIAGNOSIS — M546 Pain in thoracic spine: Secondary | ICD-10-CM | POA: Diagnosis not present

## 2014-12-22 DIAGNOSIS — S32030A Wedge compression fracture of third lumbar vertebra, initial encounter for closed fracture: Secondary | ICD-10-CM | POA: Diagnosis not present

## 2014-12-22 DIAGNOSIS — Z87448 Personal history of other diseases of urinary system: Secondary | ICD-10-CM | POA: Diagnosis not present

## 2014-12-22 DIAGNOSIS — S3992XA Unspecified injury of lower back, initial encounter: Secondary | ICD-10-CM | POA: Diagnosis not present

## 2014-12-22 DIAGNOSIS — Z7982 Long term (current) use of aspirin: Secondary | ICD-10-CM | POA: Insufficient documentation

## 2014-12-22 DIAGNOSIS — E785 Hyperlipidemia, unspecified: Secondary | ICD-10-CM | POA: Insufficient documentation

## 2014-12-22 DIAGNOSIS — Z8719 Personal history of other diseases of the digestive system: Secondary | ICD-10-CM | POA: Diagnosis not present

## 2014-12-22 DIAGNOSIS — S299XXA Unspecified injury of thorax, initial encounter: Secondary | ICD-10-CM | POA: Diagnosis not present

## 2014-12-22 DIAGNOSIS — Z8739 Personal history of other diseases of the musculoskeletal system and connective tissue: Secondary | ICD-10-CM | POA: Diagnosis not present

## 2014-12-22 DIAGNOSIS — M545 Low back pain: Secondary | ICD-10-CM | POA: Diagnosis not present

## 2014-12-22 DIAGNOSIS — Z87891 Personal history of nicotine dependence: Secondary | ICD-10-CM | POA: Diagnosis not present

## 2014-12-22 MED ORDER — OXYCODONE-ACETAMINOPHEN 5-325 MG PO TABS: 1.0000 | ORAL_TABLET | Freq: Four times a day (QID) | ORAL | Status: DC | PRN

## 2014-12-22 MED ORDER — MORPHINE SULFATE 4 MG/ML IJ SOLN
4.0000 mg | Freq: Once | INTRAMUSCULAR | Status: AC
  Administered 2014-12-22: 4 mg via INTRAMUSCULAR
  Filled 2014-12-22: qty 1

## 2014-12-22 MED ORDER — OXYCODONE-ACETAMINOPHEN 5-325 MG PO TABS
1.0000 | ORAL_TABLET | Freq: Once | ORAL | Status: AC
  Administered 2014-12-22: 1 via ORAL
  Filled 2014-12-22: qty 1

## 2014-12-22 NOTE — ED Notes (Signed)
Pt to room 5 in w/c. Able to bear weight and take 2 steps to bed with cg@. Pt reports she was standing on her bumper reaching into the back seat of her car over the cargo area, and lost her balance, falling backward onto concrete parking lot. Pt states she landed on her low back, denies head injury or loc, cc is left hip and left low back pain.

## 2014-12-22 NOTE — ED Provider Notes (Signed)
CSN: [id number]     Arrival date & time 12/22/14  1884 History   First MD Initiated Contact with Patient 12/22/14 0932     Chief Complaint  Patient presents with  . Fall     Patient is a 74 y.o. female presenting with fall. The history is provided by the patient.  Fall This is a new problem. The current episode started 12 to 24 hours ago. The problem occurs constantly. The problem has been gradually worsening. Pertinent negatives include no chest pain and no headaches. The symptoms are aggravated by walking. The symptoms are relieved by rest.  Pt was standing on back of [place] and fell backwards landing on her left side No LOC No head injury No neck pain No cp She reports low back pain She reports mild abd pain but no vomiting She denies any hip pain at this time  No weakness No urinary/fecal incontinence reported Past Medical History  Diagnosis Date  . CAD (coronary artery disease)     s/p CABG  . HLD (hyperlipidemia)   . HTN (hypertension)   . Hypothyroidism   . Gout   . Osteoporosis   . Breast cancer     s/p mastectomy  . GERD (gastroesophageal reflux disease)   . PVD (peripheral vascular disease)   . Low back pain   . DDD (degenerative disc disease), lumbar   . Obesity   . Discoid lupus   . OSA (obstructive sleep apnea)     did not tolerate CPAP  . Dyspnea   . Renal disorder    Past Surgical History  Procedure Laterality Date  . Hysterectomy - unknown type    . Ankle surgery  7/05    left  . Mastectomy  1985    right  . Coronary artery bypass graft  1997  . Renal artery stent    . Oophorectomy    . Elbow surgery      right   Family History  Problem Relation Age of Onset  . Coronary artery disease    . Diabetes    . Hypertension    . Aneurysm    . Stroke     History  Substance Use Topics  . Smoking status: Former Smoker    Quit date: 07/23/1995  . Smokeless tobacco: Not on file  . Alcohol Use: No   OB History    No data available      Review of Systems  Cardiovascular: Negative for chest pain.  Gastrointestinal: Negative for vomiting.  Musculoskeletal: Positive for back pain. Negative for neck pain.  Neurological: Negative for weakness, numbness and headaches.  All other systems reviewed and are negative.     Allergies  Amoxicillin and Clarithromycin  Home Medications   Prior to Admission medications   Medication Sig Start Date End Date Taking? Authorizing Provider  amLODipine-benazepril (LOTREL) 10-20 MG per capsule Take 1 capsule by mouth daily.      Historical Provider, MD  aspirin 81 MG tablet Take 1 tablet (81 mg total) by mouth daily. 12/10/10   Larey Dresser, MD  atorvastatin (LIPITOR) 40 MG tablet Take 40 mg by mouth daily.      Historical Provider, MD  guanFACINE (TENEX) 2 MG tablet Take 2 mg by mouth at bedtime.      Historical Provider, MD  levothyroxine (SYNTHROID, LEVOTHROID) 125 MCG tablet Take 100 mcg by mouth daily.     Historical Provider, MD  Multiple Vitamin (MULTIVITAMIN) capsule Take 1 capsule by mouth  daily.    Historical Provider, MD   BP 116/51 mmHg  Pulse 82  Temp(Src) 97.7 F (36.5 C) (Oral)  Resp 18  Ht _0  (1.651 m)  Wt 185 lb (83.915 kg)  BMI 30.79 kg/m2  SpO2 99% Physical Exam CONSTITUTIONAL: Well developed/well nourished HEAD: Normocephalic/atraumatic EYES: EOMI/PERRL ENMT: Mucous membranes moist NECK: supple no meningeal signs SPINE/BACK:no cervical spine tenderness, NEXUS criteria met.  Diffuse thoracic/lumbar tenderness No bruising/crepitance/stepoffs noted to spine CV: S1/S2 noted, no murmurs/rubs/gallops noted LUNGS: Lungs are clear to auscultation bilaterally, no apparent distress ABDOMEN: soft, mild tenderness to LLQ no bruising is noted, no rebound or guarding, bowel sounds noted throughout abdomen GU:no cva tenderness NEURO: Pt is awake/alert/appropriate, moves all extremitiesx4.  No facial droop.  GCS 15.  She has equal power with hip flex/extension,  knee flex/extension in bilateral lower extremities EXTREMITIES: pulses normal/equal, full ROM. All extremities/joints palpated/ranged and nontender She has no LE deformity.  I can fully range both hips without any tenderness SKIN: warm, color normal PSYCH: no abnormalities of mood noted, alert and oriented to situation  ED Course  Procedures  9:47 AM Will obtain T/L spine imaging No other indication for imaging Pt has mild abd tenderness from fall, but no bruising.  She has no signs of acute traumatic abdominal injury Defer CT imaging for now 10:28 AM Pt with lumbar compression fx She has no focal weakness in her lower extremities She can stand/ambulated but has pain in her back (no further abd pain reported) Will need rest, pain control and PCP Followup next week Discussed return precautions with patient/family Imaging Review Dg Thoracic Spine 4v  12/22/2014   CLINICAL DATA:  Dorsalgia after fall 1 day prior  EXAM: THORACIC SPINE - 3 VIEW  COMPARISON:  Chest radiograph April 10, 2013  FINDINGS: Frontal, lateral, and swimmer's views obtained. There is no fracture or spondylolisthesis. There is mild disc space narrowing at multiple levels. No erosive change.  IMPRESSION: Osteoarthritic change at multiple levels, stable. No fracture or spondylolisthesis.   Electronically Signed   By: Lowella Grip III M.D.   On: 12/22/2014 10:10   Dg Lumbar Spine Complete  12/22/2014   CLINICAL DATA:  Acute low back pain with left radiculopathy after fall yesterday.  EXAM: LUMBAR SPINE - COMPLETE 4+ VIEW  COMPARISON:  August 25, 2013.  FINDINGS: Diffuse osteopenia is noted. Degenerative disc disease is noted at L3-4, L4-5 and L5-S1. Mild superior endplate depression of L3 vertebral body is noted which is increased compared to prior exam. Minimal grade 1 anterolisthesis of L4-5 is noted secondary to posterior facet joint hypertrophy. Calcified abdominal aortic aneurysm is again noted.  IMPRESSION:  Multilevel degenerative disc disease. Interval development of mild superior endplate depression of L3 consistent with fracture of indeterminate age. MRI may be performed for further evaluation.   Electronically Signed   By: Marijo Conception, M.D.   On: 12/22/2014 10:13     Medications  oxyCODONE-acetaminophen (PERCOCET/ROXICET) 5-325 MG per tablet 1 tablet (not administered)    MDM   Final diagnoses:  Back pain  Compression fracture of lumbar vertebra, closed, initial encounter    Nursing notes including past medical history and social history reviewed and considered in documentation xrays/imaging reviewed by myself and considered during evaluation     Ripley Fraise, MD 12/22/14 1030

## 2014-12-22 NOTE — ED Notes (Signed)
Patient transported to X-ray 

## 2014-12-27 DIAGNOSIS — M545 Low back pain: Secondary | ICD-10-CM | POA: Diagnosis not present

## 2015-01-02 ENCOUNTER — Ambulatory Visit
Admission: RE | Admit: 2015-01-02 | Discharge: 2015-01-02 | Disposition: A | Discharge status: Home / Self Care | Payer: Medicare Other | Source: Ambulatory Visit | Attending: Family Medicine | Admitting: Family Medicine

## 2015-01-02 ENCOUNTER — Other Ambulatory Visit: Payer: Self-pay | Admitting: Family Medicine

## 2015-01-02 DIAGNOSIS — S32000S Wedge compression fracture of unspecified lumbar vertebra, sequela: Secondary | ICD-10-CM

## 2015-01-02 DIAGNOSIS — S32030D Wedge compression fracture of third lumbar vertebra, subsequent encounter for fracture with routine healing: Secondary | ICD-10-CM | POA: Diagnosis not present

## 2015-01-12 DIAGNOSIS — M545 Low back pain: Secondary | ICD-10-CM | POA: Diagnosis not present

## 2015-02-22 DIAGNOSIS — M545 Low back pain: Secondary | ICD-10-CM | POA: Diagnosis not present

## 2015-03-03 DIAGNOSIS — M545 Low back pain: Secondary | ICD-10-CM | POA: Diagnosis not present

## 2015-03-06 DIAGNOSIS — M545 Low back pain: Secondary | ICD-10-CM | POA: Diagnosis not present

## 2015-03-09 DIAGNOSIS — M545 Low back pain: Secondary | ICD-10-CM | POA: Diagnosis not present

## 2015-03-14 DIAGNOSIS — M545 Low back pain: Secondary | ICD-10-CM | POA: Diagnosis not present

## 2015-03-15 DIAGNOSIS — M545 Low back pain: Secondary | ICD-10-CM | POA: Diagnosis not present

## 2015-03-23 DIAGNOSIS — M545 Low back pain: Secondary | ICD-10-CM | POA: Diagnosis not present

## 2015-04-04 DIAGNOSIS — M545 Low back pain: Secondary | ICD-10-CM | POA: Diagnosis not present

## 2015-04-20 DIAGNOSIS — M545 Low back pain: Secondary | ICD-10-CM | POA: Diagnosis not present

## 2015-05-10 DIAGNOSIS — M545 Low back pain: Secondary | ICD-10-CM | POA: Diagnosis not present

## 2015-08-08 DIAGNOSIS — I10 Essential (primary) hypertension: Secondary | ICD-10-CM | POA: Diagnosis not present

## 2015-08-12 DIAGNOSIS — E119 Type 2 diabetes mellitus without complications: Secondary | ICD-10-CM | POA: Diagnosis not present

## 2015-08-12 DIAGNOSIS — E039 Hypothyroidism, unspecified: Secondary | ICD-10-CM | POA: Diagnosis not present

## 2015-08-12 DIAGNOSIS — E782 Mixed hyperlipidemia: Secondary | ICD-10-CM | POA: Diagnosis not present

## 2015-08-12 DIAGNOSIS — I119 Hypertensive heart disease without heart failure: Secondary | ICD-10-CM | POA: Diagnosis not present

## 2015-08-12 DIAGNOSIS — R7309 Other abnormal glucose: Secondary | ICD-10-CM | POA: Diagnosis not present

## 2015-09-29 DIAGNOSIS — I701 Atherosclerosis of renal artery: Secondary | ICD-10-CM | POA: Diagnosis not present

## 2015-09-29 DIAGNOSIS — I25119 Atherosclerotic heart disease of native coronary artery with unspecified angina pectoris: Secondary | ICD-10-CM | POA: Diagnosis not present

## 2015-09-29 DIAGNOSIS — R0602 Shortness of breath: Secondary | ICD-10-CM | POA: Diagnosis not present

## 2015-09-29 DIAGNOSIS — I70213 Atherosclerosis of native arteries of extremities with intermittent claudication, bilateral legs: Secondary | ICD-10-CM | POA: Diagnosis not present

## 2015-10-26 DIAGNOSIS — I119 Hypertensive heart disease without heart failure: Secondary | ICD-10-CM | POA: Diagnosis not present

## 2015-10-26 DIAGNOSIS — N390 Urinary tract infection, site not specified: Secondary | ICD-10-CM | POA: Diagnosis not present

## 2015-10-26 DIAGNOSIS — E119 Type 2 diabetes mellitus without complications: Secondary | ICD-10-CM | POA: Diagnosis not present

## 2015-10-30 DIAGNOSIS — E7800 Pure hypercholesterolemia, unspecified: Secondary | ICD-10-CM | POA: Diagnosis not present

## 2015-10-30 DIAGNOSIS — I25119 Atherosclerotic heart disease of native coronary artery with unspecified angina pectoris: Secondary | ICD-10-CM | POA: Diagnosis not present

## 2015-10-31 DIAGNOSIS — I714 Abdominal aortic aneurysm, without rupture: Secondary | ICD-10-CM | POA: Diagnosis not present

## 2015-11-07 DIAGNOSIS — I25119 Atherosclerotic heart disease of native coronary artery with unspecified angina pectoris: Secondary | ICD-10-CM | POA: Diagnosis not present

## 2015-11-07 DIAGNOSIS — R0602 Shortness of breath: Secondary | ICD-10-CM | POA: Diagnosis not present

## 2015-11-07 DIAGNOSIS — I70213 Atherosclerosis of native arteries of extremities with intermittent claudication, bilateral legs: Secondary | ICD-10-CM | POA: Diagnosis not present

## 2015-11-07 DIAGNOSIS — I701 Atherosclerosis of renal artery: Secondary | ICD-10-CM | POA: Diagnosis not present

## 2015-11-09 DIAGNOSIS — R7301 Impaired fasting glucose: Secondary | ICD-10-CM | POA: Diagnosis not present

## 2015-11-09 DIAGNOSIS — E782 Mixed hyperlipidemia: Secondary | ICD-10-CM | POA: Diagnosis not present

## 2015-11-09 DIAGNOSIS — I714 Abdominal aortic aneurysm, without rupture: Secondary | ICD-10-CM | POA: Diagnosis not present

## 2015-11-09 DIAGNOSIS — Z0000 Encounter for general adult medical examination without abnormal findings: Secondary | ICD-10-CM | POA: Diagnosis not present

## 2016-01-15 ENCOUNTER — Encounter (HOSPITAL_BASED_OUTPATIENT_CLINIC_OR_DEPARTMENT_OTHER): Payer: Self-pay | Admitting: *Deleted

## 2016-01-15 ENCOUNTER — Emergency Department (HOSPITAL_BASED_OUTPATIENT_CLINIC_OR_DEPARTMENT_OTHER): Payer: Medicare Other

## 2016-01-15 ENCOUNTER — Emergency Department (HOSPITAL_BASED_OUTPATIENT_CLINIC_OR_DEPARTMENT_OTHER)
Admission: EM | Admit: 2016-01-15 | Discharge: 2016-01-15 | Disposition: A | Discharge status: Home / Self Care | Payer: Medicare Other | Attending: Emergency Medicine | Admitting: Emergency Medicine

## 2016-01-15 ENCOUNTER — Other Ambulatory Visit: Payer: Self-pay

## 2016-01-15 DIAGNOSIS — R202 Paresthesia of skin: Secondary | ICD-10-CM

## 2016-01-15 DIAGNOSIS — I2510 Atherosclerotic heart disease of native coronary artery without angina pectoris: Secondary | ICD-10-CM | POA: Diagnosis not present

## 2016-01-15 DIAGNOSIS — Z79899 Other long term (current) drug therapy: Secondary | ICD-10-CM | POA: Insufficient documentation

## 2016-01-15 DIAGNOSIS — R51 Headache: Secondary | ICD-10-CM | POA: Insufficient documentation

## 2016-01-15 DIAGNOSIS — I499 Cardiac arrhythmia, unspecified: Secondary | ICD-10-CM

## 2016-01-15 DIAGNOSIS — E785 Hyperlipidemia, unspecified: Secondary | ICD-10-CM | POA: Diagnosis not present

## 2016-01-15 DIAGNOSIS — Z87891 Personal history of nicotine dependence: Secondary | ICD-10-CM | POA: Insufficient documentation

## 2016-01-15 DIAGNOSIS — Z7982 Long term (current) use of aspirin: Secondary | ICD-10-CM | POA: Insufficient documentation

## 2016-01-15 DIAGNOSIS — R002 Palpitations: Secondary | ICD-10-CM | POA: Diagnosis not present

## 2016-01-15 DIAGNOSIS — R209 Unspecified disturbances of skin sensation: Secondary | ICD-10-CM | POA: Diagnosis not present

## 2016-01-15 DIAGNOSIS — I10 Essential (primary) hypertension: Secondary | ICD-10-CM | POA: Diagnosis not present

## 2016-01-15 DIAGNOSIS — I498 Other specified cardiac arrhythmias: Secondary | ICD-10-CM

## 2016-01-15 DIAGNOSIS — E039 Hypothyroidism, unspecified: Secondary | ICD-10-CM | POA: Insufficient documentation

## 2016-01-15 DIAGNOSIS — E669 Obesity, unspecified: Secondary | ICD-10-CM | POA: Diagnosis not present

## 2016-01-15 DIAGNOSIS — R008 Other abnormalities of heart beat: Secondary | ICD-10-CM | POA: Diagnosis not present

## 2016-01-15 DIAGNOSIS — Z853 Personal history of malignant neoplasm of breast: Secondary | ICD-10-CM | POA: Insufficient documentation

## 2016-01-15 DIAGNOSIS — R200 Anesthesia of skin: Secondary | ICD-10-CM | POA: Diagnosis not present

## 2016-01-15 DIAGNOSIS — R0789 Other chest pain: Secondary | ICD-10-CM | POA: Diagnosis present

## 2016-01-15 LAB — CBC WITH DIFFERENTIAL/PLATELET
Basophils Absolute: 0 10*3/uL (ref 0.0–0.1)
Basophils Relative: 0 %
Eosinophils Absolute: 0.2 10*3/uL (ref 0.0–0.7)
Eosinophils Relative: 3 %
HCT: 39.5 % (ref 36.0–46.0)
Hemoglobin: 12.8 g/dL (ref 12.0–15.0)
Lymphocytes Relative: 23 %
Lymphs Abs: 1.6 10*3/uL (ref 0.7–4.0)
MCH: 27.6 pg (ref 26.0–34.0)
MCHC: 32.4 g/dL (ref 30.0–36.0)
MCV: 85.3 fL (ref 78.0–100.0)
Monocytes Absolute: 0.8 10*3/uL (ref 0.1–1.0)
Monocytes Relative: 12 %
Neutro Abs: 4.4 10*3/uL (ref 1.7–7.7)
Neutrophils Relative %: 62 %
Platelets: 202 10*3/uL (ref 150–400)
RBC: 4.63 MIL/uL (ref 3.87–5.11)
RDW: 15.6 % — ABNORMAL HIGH (ref 11.5–15.5)
WBC: 7.1 10*3/uL (ref 4.0–10.5)

## 2016-01-15 LAB — BASIC METABOLIC PANEL
Anion gap: 7 (ref 5–15)
BUN: 15 mg/dL (ref 6–20)
CO2: 24 mmol/L (ref 22–32)
Calcium: 9.4 mg/dL (ref 8.9–10.3)
Chloride: 109 mmol/L (ref 101–111)
Creatinine, Ser: 1.03 mg/dL — ABNORMAL HIGH (ref 0.44–1.00)
GFR calc Af Amer: >60 mL/min (ref >60)
GFR calc non Af Amer: 52 mL/min — ABNORMAL LOW (ref >60)
Glucose, Bld: 94 mg/dL (ref 65–99)
Potassium: 4.5 mmol/L (ref 3.5–5.1)
Sodium: 140 mmol/L (ref 135–145)

## 2016-01-15 LAB — TROPONIN I: Troponin I: <0.03 ng/mL (ref <0.031)

## 2016-01-15 NOTE — ED Notes (Signed)
Pt reports "fluttering" in her chest, along with numbness and tingling to right side of face x yesterday. Pt states she also feels sob at times. ekg performed while pt being triaged, iv access obtained.

## 2016-01-15 NOTE — Discharge Instructions (Signed)
Paresthesia Paresthesia is an abnormal burning or prickling sensation. This sensation is generally felt in the hands, arms, legs, or feet. However, it may occur in any part of the body. Usually, it is not painful. The feeling may be described as:  Tingling or numbness.  Pins and needles.  Skin crawling.  Buzzing.  Limbs falling asleep.  Itching. Most people experience temporary (transient) paresthesia at some time in their lives. Paresthesia may occur when you breathe too quickly (hyperventilation). It can also occur without any apparent cause. Commonly, paresthesia occurs when pressure is placed on a nerve. The sensation quickly goes away after the pressure is removed. For some people, however, paresthesia is a long-lasting (chronic) condition that is caused by an underlying disorder. If you continue to have paresthesia, you may need further medical evaluation. HOME CARE INSTRUCTIONS Watch your condition for any changes. Taking the following actions may help to lessen any discomfort that you are feeling:  Avoid drinking alcohol.  Try acupuncture or massage to help relieve your symptoms.  Keep all follow-up visits as directed by your health care provider. This is important. SEEK MEDICAL CARE IF:  You continue to have episodes of paresthesia.  Your burning or prickling feeling gets worse when you walk.  You have pain, cramps, or dizziness.  You develop a rash. SEEK IMMEDIATE MEDICAL CARE IF:  You feel weak.  You have trouble walking or moving.  You have problems with speech, understanding, or vision.  You feel confused.  You cannot control your bladder or bowel movements.  You have numbness after an injury.  You faint.   This information is not intended to replace advice given to you by your health care provider. Make sure you discuss any questions you have with your health care provider.   Document Released: 06/28/2002 Document Revised: 11/22/2014 Document Reviewed:  07/04/2014 Elsevier Interactive Patient Education 2016 [employer].  Palpitations A palpitation is the feeling that your heartbeat is irregular or is faster than normal. It may feel like your heart is fluttering or skipping a beat. Palpitations are usually not a serious problem. However, in some cases, you may need further medical evaluation. CAUSES  Palpitations can be caused by:  Smoking.  Caffeine or other stimulants, such as diet pills or energy drinks.  Alcohol.  Stress and anxiety.  Strenuous physical activity.  Fatigue.  Certain medicines.  Heart disease, especially if you have a history of irregular heart rhythms (arrhythmias), such as atrial fibrillation, atrial flutter, or supraventricular tachycardia.  An improperly working pacemaker or defibrillator. DIAGNOSIS  To find the cause of your palpitations, your health care provider will take your medical history and perform a physical exam. Your health care provider may also have you take a test called an ambulatory electrocardiogram (ECG). An ECG records your heartbeat patterns over a 24-hour period. You may also have other tests, such as:  Transthoracic echocardiogram (TTE). During echocardiography, sound waves are used to evaluate how blood flows through your heart.  Transesophageal echocardiogram (TEE).  Cardiac monitoring. This allows your health care provider to monitor your heart rate and rhythm in real time.  Holter monitor. This is a portable device that records your heartbeat and can help diagnose heart arrhythmias. It allows your health care provider to track your heart activity for several days, if needed.  Stress tests by exercise or by giving medicine that makes the heart beat faster. TREATMENT  Treatment of palpitations depends on the cause of your symptoms and can vary greatly. Most  cases of palpitations do not require any treatment other than time, relaxation, and monitoring your symptoms. Other causes,  such as atrial fibrillation, atrial flutter, or supraventricular tachycardia, usually require further treatment. HOME CARE INSTRUCTIONS   Avoid:  Caffeinated coffee, tea, soft drinks, diet pills, and energy drinks.  Chocolate.  Alcohol.  Stop smoking if you smoke.  Reduce your stress and anxiety. Things that can help you relax include:  A method of controlling things in your body, such as your heartbeats, with your mind (biofeedback).  Yoga.  Meditation.  Physical activity such as swimming, jogging, or walking.  Get plenty of rest and sleep. SEEK MEDICAL CARE IF:   You continue to have a fast or irregular heartbeat beyond 24 hours.  Your palpitations occur more often. SEEK IMMEDIATE MEDICAL CARE IF:  You have chest pain or shortness of breath.  You have a severe headache.  You feel dizzy or you faint. MAKE SURE YOU:  Understand these instructions.  Will watch your condition.  Will get help right away if you are not doing well or get worse.   This information is not intended to replace advice given to you by your health care provider. Make sure you discuss any questions you have with your health care provider.   Document Released: 07/05/2000 Document Revised: 07/13/2013 Document Reviewed: 09/06/2011 Elsevier Interactive Patient Education [employer].

## 2016-01-15 NOTE — ED Notes (Signed)
Pt noted to have brady heart rate, which fluctuates between sr/sb, associated with bigeminy pvc's, md notified, 12 lead obtained, pt in nad, denies pain.

## 2016-01-15 NOTE — ED Provider Notes (Signed)
CSN: [id number]     Arrival date & time 01/15/16  0946 History   First MD Initiated Contact with Patient 01/15/16 1001     Chief Complaint  Patient presents with  . Chest Pain      Patient is a 75 y.o. female presenting with chest pain.  Chest Pain Associated symptoms: headache, numbness and palpitations   Associated symptoms: no abdominal pain, no back pain, no nausea, no shortness of breath, not vomiting and no weakness   Patient presents after a day or 2 of feeling her heart beat strangely. States will go slow at times and fast at times. No lightheadedness or dizziness. No chest pain. No trouble breathing. States she will sometimes get a slight tightness in her chest but not necessarily associated with the heart rate. Also states her right face feels tingly. No weakness. No extremity numbness or weakness. No anginal pain. No head trauma. Occasional have a dull headache also. No recent change in medications. She is not on anticoagulation.  Past Medical History  Diagnosis Date  . CAD (coronary artery disease)     s/p CABG  . HLD (hyperlipidemia)   . HTN (hypertension)   . Hypothyroidism   . Gout   . Osteoporosis   . Breast cancer [facility])     s/p mastectomy  . GERD (gastroesophageal reflux disease)   . PVD (peripheral vascular disease) ([place])   . Low back pain   . DDD (degenerative disc disease), lumbar   . Obesity   . Discoid lupus   . OSA (obstructive sleep apnea)     did not tolerate CPAP  . Dyspnea   . Renal disorder    Past Surgical History  Procedure Laterality Date  . Hysterectomy - unknown type    . Ankle surgery  7/05    left  . Mastectomy  1985    right  . Coronary artery bypass graft  1997  . Renal artery stent    . Oophorectomy    . Elbow surgery      right   Family History  Problem Relation Age of Onset  . Coronary artery disease    . Diabetes    . Hypertension    . Aneurysm    . Stroke     Social History  Substance Use Topics  . Smoking status:  Former Smoker    Quit date: 07/23/1995  . Smokeless tobacco: None  . Alcohol Use: No   OB History    No data available     Review of Systems  Constitutional: Negative for activity change and appetite change.  Eyes: Negative for pain.  Respiratory: Positive for chest tightness. Negative for shortness of breath.   Cardiovascular: Positive for chest pain and palpitations. Negative for leg swelling.  Gastrointestinal: Negative for nausea, vomiting, abdominal pain and diarrhea.  Genitourinary: Negative for flank pain.  Musculoskeletal: Negative for back pain and neck stiffness.  Skin: Negative for rash.  Neurological: Positive for numbness and headaches. Negative for weakness.  Psychiatric/Behavioral: Negative for behavioral problems.      Allergies  Amoxicillin and Clarithromycin  Home Medications   Prior to Admission medications   Medication Sig Start Date End Date Taking? Authorizing Provider  carvedilol (COREG) 6.25 MG tablet Take 6.25 mg by mouth 2 (two) times daily with a meal.   Yes Historical Provider, MD  amLODipine-benazepril (LOTREL) 10-20 MG per capsule Take 1 capsule by mouth daily.      Historical Provider, MD  aspirin 81 MG  tablet Take 1 tablet (81 mg total) by mouth daily. 12/10/10   Larey Dresser, MD  atorvastatin (LIPITOR) 40 MG tablet Take 40 mg by mouth daily.      Historical Provider, MD  guanFACINE (TENEX) 2 MG tablet Take 2 mg by mouth at bedtime.      Historical Provider, MD  levothyroxine (SYNTHROID, LEVOTHROID) 125 MCG tablet Take 100 mcg by mouth daily.     Historical Provider, MD  Multiple Vitamin (MULTIVITAMIN) capsule Take 1 capsule by mouth daily.    Historical Provider, MD  oxyCODONE-acetaminophen (PERCOCET/ROXICET) 5-325 MG per tablet Take 1 tablet by mouth every 6 (six) hours as needed for severe pain. 12/22/14   Ripley Fraise, MD   BP 132/60 mmHg  Pulse 55  Temp(Src) 98.2 F (36.8 C) (Oral)  Resp 19  SpO2 96% Physical Exam  Constitutional:  She appears well-developed.  HENT:  Head: Atraumatic.  Eyes: EOM are normal. Pupils are equal, round, and reactive to light.  Cardiovascular: Normal rate.   Pulmonary/Chest: Effort normal.  Abdominal: Soft. She exhibits distension.  Neurological: She is alert.  Slight paresthesias upper and lower face. Extraocular movements intact. Visual fields grossly intact by confrontation. Good grip strength bilaterally.  Skin: Skin is warm.  Psychiatric: She has a normal mood and affect.    ED Course  Procedures (including critical care time) Labs Review Labs Reviewed  CBC WITH DIFFERENTIAL/PLATELET - Abnormal; Notable for the following:    RDW 15.6 (*)    All other components within normal limits  BASIC METABOLIC PANEL - Abnormal; Notable for the following:    Creatinine, Ser 1.03 (*)    GFR calc non Af Amer 52 (*)    All other components within normal limits  TROPONIN I    Imaging Review Dg Chest 2 View  01/15/2016  CLINICAL DATA:  Right-sided tingling for 2 days.  Headache. EXAM: CHEST  2 VIEW COMPARISON:  April 10, 2013 FINDINGS: There is no edema or consolidation. Heart is upper normal in size with pulmonary vascularity within normal limits. Patient is status post coronary artery bypass grafting. No adenopathy. There is mild degenerative change in the thoracic spine. IMPRESSION: No edema or consolidation. Electronically Signed   By: Lowella Grip III M.D.   On: 01/15/2016 10:37   Ct Head Wo Contrast  01/15/2016  CLINICAL DATA:  Right-sided facial numbness and tingling for 1 day. History of right-sided breast carcinoma EXAM: CT HEAD WITHOUT CONTRAST TECHNIQUE: Contiguous axial images were obtained from the base of the skull through the vertex without intravenous contrast. COMPARISON:  None. FINDINGS: Brain: The ventricles are normal in size and configuration. There is no intracranial mass, hemorrhage, extra-axial fluid collection, or midline shift. Calcification in the pineal gland  is felt to be within normal limits. No focal gray-white compartment lesions are evident. No acute infarct apparent. Vascular: There are foci of calcification in each cavernous carotid artery. Skull: The bony calvarium appears intact. Sinuses/Orbits: Visualized orbits appear symmetric bilaterally. Visualized paranasal sinuses are clear. Other: Visualized mastoid air cells are clear. IMPRESSION: No intracranial mass or hemorrhage. Calcification in the pineal gland is felt to be within normal limits. No acute infarct evident. There are foci of calcification in each cavernous carotid artery consistent with atherosclerotic change. Electronically Signed   By: Lowella Grip III M.D.   On: 01/15/2016 10:45   I have personally reviewed and evaluated these images and lab results as part of my medical decision-making.   EKG Interpretation  Date/Time:  Monday January 15 2016 09:57:50 EDT Ventricular Rate:  57 PR Interval:    QRS Duration: 145 QT Interval:  452 QTC Calculation: 441 R Axis:   8 Text Interpretation:  Sinus rhythm Probable left atrial enlargement Right  bundle branch block RBBB is new Confirmed by Alvino Chapel  MD, Ovid Curd [phone])  on 01/15/2016 10:10:00 AM      MDM   Final diagnoses:  Palpitations  Ventricular bigeminy  Paresthesia    Patient presents with feeling of palpitations. She will feeling go both fast and slow. On the ER she did have episodes of ventricular bigeminy. Blood pressure maintained with it. No lightheadedness with it. Sees Dr. Einar Gip, who can follow for this. Doubt ischemic cause of this at this time. Paresthesias may also need some primary care follow-up. Will discharge home. Does not appear to be a stroke.    Davonna Belling, MD 01/15/16 1153

## 2016-02-13 DIAGNOSIS — Z961 Presence of intraocular lens: Secondary | ICD-10-CM | POA: Diagnosis not present

## 2016-02-21 ENCOUNTER — Encounter (HOSPITAL_BASED_OUTPATIENT_CLINIC_OR_DEPARTMENT_OTHER): Payer: Self-pay | Admitting: Emergency Medicine

## 2016-02-21 ENCOUNTER — Emergency Department (HOSPITAL_BASED_OUTPATIENT_CLINIC_OR_DEPARTMENT_OTHER)
Admission: EM | Admit: 2016-02-21 | Discharge: 2016-02-21 | Disposition: A | Discharge status: Home / Self Care | Payer: Medicare Other | Attending: Physician Assistant | Admitting: Physician Assistant

## 2016-02-21 ENCOUNTER — Emergency Department (HOSPITAL_BASED_OUTPATIENT_CLINIC_OR_DEPARTMENT_OTHER): Payer: Medicare Other

## 2016-02-21 DIAGNOSIS — R600 Localized edema: Secondary | ICD-10-CM | POA: Diagnosis not present

## 2016-02-21 DIAGNOSIS — R609 Edema, unspecified: Secondary | ICD-10-CM | POA: Diagnosis not present

## 2016-02-21 DIAGNOSIS — Z853 Personal history of malignant neoplasm of breast: Secondary | ICD-10-CM | POA: Diagnosis not present

## 2016-02-21 DIAGNOSIS — Z79899 Other long term (current) drug therapy: Secondary | ICD-10-CM | POA: Insufficient documentation

## 2016-02-21 DIAGNOSIS — R0602 Shortness of breath: Secondary | ICD-10-CM | POA: Diagnosis not present

## 2016-02-21 DIAGNOSIS — I10 Essential (primary) hypertension: Secondary | ICD-10-CM | POA: Insufficient documentation

## 2016-02-21 DIAGNOSIS — E039 Hypothyroidism, unspecified: Secondary | ICD-10-CM | POA: Diagnosis not present

## 2016-02-21 DIAGNOSIS — I2510 Atherosclerotic heart disease of native coronary artery without angina pectoris: Secondary | ICD-10-CM | POA: Insufficient documentation

## 2016-02-21 DIAGNOSIS — Z7982 Long term (current) use of aspirin: Secondary | ICD-10-CM | POA: Diagnosis not present

## 2016-02-21 DIAGNOSIS — E669 Obesity, unspecified: Secondary | ICD-10-CM | POA: Diagnosis not present

## 2016-02-21 DIAGNOSIS — Z87891 Personal history of nicotine dependence: Secondary | ICD-10-CM | POA: Insufficient documentation

## 2016-02-21 DIAGNOSIS — M7989 Other specified soft tissue disorders: Secondary | ICD-10-CM | POA: Diagnosis present

## 2016-02-21 DIAGNOSIS — Z6830 Body mass index (BMI) 30.0-30.9, adult: Secondary | ICD-10-CM | POA: Insufficient documentation

## 2016-02-21 LAB — COMPREHENSIVE METABOLIC PANEL
ALT: 31 U/L (ref 14–54)
AST: 39 U/L (ref 15–41)
Albumin: 3.9 g/dL (ref 3.5–5.0)
Alkaline Phosphatase: 115 U/L (ref 38–126)
Anion gap: 6 (ref 5–15)
BUN: 19 mg/dL (ref 6–20)
CO2: 22 mmol/L (ref 22–32)
Calcium: 9.6 mg/dL (ref 8.9–10.3)
Chloride: 111 mmol/L (ref 101–111)
Creatinine, Ser: 1.11 mg/dL — ABNORMAL HIGH (ref 0.44–1.00)
GFR calc Af Amer: 55 mL/min — ABNORMAL LOW (ref >60)
GFR calc non Af Amer: 47 mL/min — ABNORMAL LOW (ref >60)
Glucose, Bld: 88 mg/dL (ref 65–99)
Potassium: 4.1 mmol/L (ref 3.5–5.1)
Sodium: 139 mmol/L (ref 135–145)
Total Bilirubin: 1.1 mg/dL (ref 0.3–1.2)
Total Protein: 7.8 g/dL (ref 6.5–8.1)

## 2016-02-21 LAB — CBC WITH DIFFERENTIAL/PLATELET
Basophils Absolute: 0 10*3/uL (ref 0.0–0.1)
Basophils Relative: 1 %
Eosinophils Absolute: 0.2 10*3/uL (ref 0.0–0.7)
Eosinophils Relative: 3 %
HCT: 39.2 % (ref 36.0–46.0)
Hemoglobin: 12.7 g/dL (ref 12.0–15.0)
Lymphocytes Relative: 33 %
Lymphs Abs: 1.8 10*3/uL (ref 0.7–4.0)
MCH: 27.7 pg (ref 26.0–34.0)
MCHC: 32.4 g/dL (ref 30.0–36.0)
MCV: 85.6 fL (ref 78.0–100.0)
Monocytes Absolute: 0.5 10*3/uL (ref 0.1–1.0)
Monocytes Relative: 9 %
Neutro Abs: 3 10*3/uL (ref 1.7–7.7)
Neutrophils Relative %: 54 %
Platelets: 202 10*3/uL (ref 150–400)
RBC: 4.58 MIL/uL (ref 3.87–5.11)
RDW: 15.3 % (ref 11.5–15.5)
WBC: 5.5 10*3/uL (ref 4.0–10.5)

## 2016-02-21 LAB — BRAIN NATRIURETIC PEPTIDE: B Natriuretic Peptide: 40.1 pg/mL (ref 0.0–100.0)

## 2016-02-21 LAB — TROPONIN I: Troponin I: <0.03 ng/mL (ref <0.03)

## 2016-02-21 NOTE — ED Triage Notes (Signed)
Pt reports bilat leg swelling since Sunday with no hx of same. Pt reports increased SOB with exertion.

## 2016-02-21 NOTE — ED Notes (Signed)
Patient transported to X-ray 

## 2016-02-21 NOTE — Discharge Instructions (Signed)
You can try raising your legs up for resting and try compression stockings to help with swelling.  Your labs, xray and vital signs all look reassuring today. Please follow up with your cardiologist.

## 2016-02-21 NOTE — ED Provider Notes (Signed)
[facility] Provider Note   CSN: [id number] Arrival date & time: 02/21/16  0730  First Provider Contact:  None       History   Chief Complaint Chief Complaint  Patient presents with  . Leg Swelling    HPI Sarrah P Litwiller is a 75 y.o. female.  HPI   Patient's a very pleasant 75 year old female with history of CAD, GERD, gout, hypertension, hyperlipidemia, OSA. She is presenting today with 2 days of mild swelling to her feet. Patient reports that she's never had this before. Of note is the middle of summer.  The swelling has been mild. No pain. No shortness of breath. Patient reports occasional palpitations for last 2 months. No chest pain. Patient's had no trouble with ambulation. Patient had no recent travel.  She is followed by Dr. Einar Gip, her cardiologist.  Past Medical History:  Diagnosis Date  . Breast cancer [facility])    s/p mastectomy  . CAD (coronary artery disease)    s/p CABG  . DDD (degenerative disc disease), lumbar   . Discoid lupus   . Dyspnea   . GERD (gastroesophageal reflux disease)   . Gout   . HLD (hyperlipidemia)   . HTN (hypertension)   . Hypothyroidism   . Low back pain   . Obesity   . OSA (obstructive sleep apnea)    did not tolerate CPAP  . Osteoporosis   . PVD (peripheral vascular disease) ([place])   . Renal disorder     Patient Active Problem List   Diagnosis Date Noted  . ARF (acute renal failure) ([place]) 04/10/2013  . Hyperkalemia 04/10/2013  . Hypotension, unspecified 04/10/2013  . Sinus bradycardia 04/10/2013  . DYSPNEA 01/03/2010  . LUPUS ERYTHEMATOSUS, DISCOID 06/12/2007  . HYPOTHYROIDISM 06/11/2007  . HYPERLIPIDEMIA 06/11/2007  . GOUT 06/11/2007  . HYPERTENSION 06/11/2007  . CORONARY ARTERY DISEASE 06/11/2007  . PERIPHERAL VASCULAR DISEASE 06/11/2007  . GERD 06/11/2007  . LOW BACK PAIN 06/11/2007  . OSTEOPOROSIS 06/11/2007  . BREAST CANCER, HX OF 06/11/2007    Past Surgical History:  Procedure Laterality Date  .  ANKLE SURGERY  7/05   left  . CORONARY ARTERY BYPASS GRAFT  1997  . ELBOW SURGERY     right  . hysterectomy - unknown type    . MASTECTOMY  1985   right  . OOPHORECTOMY    . RENAL ARTERY STENT      OB History    No data available       Home Medications    Prior to Admission medications   Medication Sig Start Date End Date Taking? Authorizing Provider  amLODipine-benazepril (LOTREL) 10-20 MG per capsule Take 1 capsule by mouth daily.      Historical Provider, MD  aspirin 81 MG tablet Take 1 tablet (81 mg total) by mouth daily. 12/10/10   Larey Dresser, MD  atorvastatin (LIPITOR) 40 MG tablet Take 40 mg by mouth daily.      Historical Provider, MD  carvedilol (COREG) 6.25 MG tablet Take 6.25 mg by mouth 2 (two) times daily with a meal.    Historical Provider, MD  guanFACINE (TENEX) 2 MG tablet Take 2 mg by mouth at bedtime.      Historical Provider, MD  levothyroxine (SYNTHROID, LEVOTHROID) 125 MCG tablet Take 100 mcg by mouth daily.     Historical Provider, MD  Multiple Vitamin (MULTIVITAMIN) capsule Take 1 capsule by mouth daily.    Historical Provider, MD  oxyCODONE-acetaminophen (PERCOCET/ROXICET) 5-325 MG per  tablet Take 1 tablet by mouth every 6 (six) hours as needed for severe pain. 12/22/14   Ripley Fraise, MD    Family History Family History  Problem Relation Age of Onset  . Coronary artery disease    . Diabetes    . Hypertension    . Aneurysm    . Stroke      Social History Social History  Substance Use Topics  . Smoking status: Former Smoker    Quit date: 07/23/1995  . Smokeless tobacco: Never Used  . Alcohol use No     Allergies   Amoxicillin and Clarithromycin   Review of Systems Review of Systems  Constitutional: Negative for activity change and fatigue.  HENT: Negative for congestion and drooling.   Eyes: Negative for discharge.  Respiratory: Negative for cough, chest tightness, shortness of breath and wheezing.   Cardiovascular: Positive for  palpitations and leg swelling. Negative for chest pain.  Gastrointestinal: Negative for abdominal distention.  Genitourinary: Negative for difficulty urinating and dysuria.  Skin: Negative for rash.  Allergic/Immunologic: Negative for immunocompromised state.  Psychiatric/Behavioral: Negative for behavioral problems.  All other systems reviewed and are negative.    Physical Exam Updated Vital Signs BP 133/93   Pulse 65   Temp 98.2 F (36.8 C) (Oral)   Resp 22   Ht 5\' 6"  (1.676 m)   Wt 190 lb (86.2 kg)   SpO2 98%   BMI 30.67 kg/m   Physical Exam  Constitutional: She appears well-developed and well-nourished. No distress.  HENT:  Head: Normocephalic and atraumatic.  Eyes: Conjunctivae are normal.  Neck: Neck supple.  Cardiovascular: Normal rate and regular rhythm.   No murmur heard. Pulmonary/Chest: Effort normal and breath sounds normal. No respiratory distress.  Abdominal: Soft. There is no tenderness.  Musculoskeletal: She exhibits no edema.  +1 edema bilateral lower extremities.  Neurological: She is alert.  Skin: Skin is warm and dry.  Psychiatric: She has a normal mood and affect.  Nursing note and vitals reviewed.    ED Treatments / Results  Labs (all labs ordered are listed, but only abnormal results are displayed) Labs Reviewed  COMPREHENSIVE METABOLIC PANEL - Abnormal; Notable for the following:       Result Value   Creatinine, Ser 1.11 (*)    GFR calc non Af Amer 47 (*)    GFR calc Af Amer 55 (*)    All other components within normal limits  CBC WITH DIFFERENTIAL/PLATELET  BRAIN NATRIURETIC PEPTIDE  TROPONIN I  I-STAT TROPOININ, ED    EKG  EKG Interpretation  Date/Time:  Wednesday February 21 2016 07:49:46 EDT Ventricular Rate:  61 PR Interval:    QRS Duration: 127 QT Interval:  443 QTC Calculation: 447 R Axis:   5 Text Interpretation:  Sinus rhythm Multiple ventricular premature complexes Probable left atrial enlargement Right bundle branch  block No significant change since last tracing Confirmed by Gerald Leitz ([id number]) on 02/21/2016 8:09:21 AM       Radiology Dg Chest 2 View  Result Date: 02/21/2016 CLINICAL DATA:  Lower extremity edema EXAM: CHEST  2 VIEW COMPARISON:  January 15, 2016 FINDINGS: The lungs are clear. Heart is upper normal in size with pulmonary vascularity within normal limits. No adenopathy. Patient is status post coronary artery bypass grafting. No bone lesions are evident. There is mild degenerative change in the thoracic spine. IMPRESSION: No edema or consolidation.  Stable cardiac silhouette. Electronically Signed   By: Lowella Grip III M.D.  On: 02/21/2016 08:54    Procedures Procedures (including critical care time)  Medications Ordered in ED Medications - No data to display   Initial Impression / Assessment and Plan / ED Course  I have reviewed the triage vital signs and the nursing notes.  Pertinent labs & imaging results that were available during my care of the patient were reviewed by me and considered in my medical decision making (see chart for details).  Clinical Course  Patient is a 75 year old female with history of CAD, GERD, gout, hypertension, hyperlipidemia, OSA presenting today with new-onset edema to bilateral lower extremities. Patient has +1 swelling. I think this likely has more to do with heat and poor venous return then conjestive heart failure however, given her history we will get BNP CBC Chem-7 and chest x-ray. Patient is chest is clear on exam and she is satting 99% on room air. If the results of tests are negative we'll have her follow-up with her cardiologist.  10:39 AM All labs, xray and vitals are allnormal. Will have her raise legs, wear compression stockings and follow up with her cardiologist.   Final Clinical Impressions(s) / ED Diagnoses   Final diagnoses:  None    New Prescriptions New Prescriptions   No medications on file     Tylyn Derwin Julio Alm,  MD 02/21/16 1039

## 2016-02-21 NOTE — ED Notes (Signed)
MD at bedside. 

## 2016-02-26 DIAGNOSIS — R002 Palpitations: Secondary | ICD-10-CM | POA: Diagnosis not present

## 2016-03-06 DIAGNOSIS — R7309 Other abnormal glucose: Secondary | ICD-10-CM | POA: Diagnosis not present

## 2016-03-06 DIAGNOSIS — I119 Hypertensive heart disease without heart failure: Secondary | ICD-10-CM | POA: Diagnosis not present

## 2016-03-06 DIAGNOSIS — E782 Mixed hyperlipidemia: Secondary | ICD-10-CM | POA: Diagnosis not present

## 2016-03-06 DIAGNOSIS — I498 Other specified cardiac arrhythmias: Secondary | ICD-10-CM | POA: Diagnosis not present

## 2016-03-16 ENCOUNTER — Emergency Department (HOSPITAL_COMMUNITY)
Admission: EM | Admit: 2016-03-16 | Discharge: 2016-03-16 | Disposition: A | Discharge status: Home / Self Care | Payer: Medicare Other | Attending: Emergency Medicine | Admitting: Emergency Medicine

## 2016-03-16 ENCOUNTER — Emergency Department (HOSPITAL_COMMUNITY): Payer: Medicare Other

## 2016-03-16 ENCOUNTER — Other Ambulatory Visit: Payer: Self-pay

## 2016-03-16 ENCOUNTER — Encounter (HOSPITAL_COMMUNITY): Payer: Self-pay

## 2016-03-16 DIAGNOSIS — Y92410 Unspecified street and highway as the place of occurrence of the external cause: Secondary | ICD-10-CM | POA: Diagnosis not present

## 2016-03-16 DIAGNOSIS — Y939 Activity, unspecified: Secondary | ICD-10-CM | POA: Diagnosis not present

## 2016-03-16 DIAGNOSIS — I2510 Atherosclerotic heart disease of native coronary artery without angina pectoris: Secondary | ICD-10-CM | POA: Diagnosis not present

## 2016-03-16 DIAGNOSIS — Z7982 Long term (current) use of aspirin: Secondary | ICD-10-CM | POA: Insufficient documentation

## 2016-03-16 DIAGNOSIS — Z955 Presence of coronary angioplasty implant and graft: Secondary | ICD-10-CM | POA: Insufficient documentation

## 2016-03-16 DIAGNOSIS — I10 Essential (primary) hypertension: Secondary | ICD-10-CM | POA: Insufficient documentation

## 2016-03-16 DIAGNOSIS — Y999 Unspecified external cause status: Secondary | ICD-10-CM | POA: Diagnosis not present

## 2016-03-16 DIAGNOSIS — S299XXA Unspecified injury of thorax, initial encounter: Secondary | ICD-10-CM | POA: Diagnosis not present

## 2016-03-16 DIAGNOSIS — S20219A Contusion of unspecified front wall of thorax, initial encounter: Secondary | ICD-10-CM | POA: Diagnosis not present

## 2016-03-16 DIAGNOSIS — R079 Chest pain, unspecified: Secondary | ICD-10-CM | POA: Diagnosis not present

## 2016-03-16 DIAGNOSIS — S20211A Contusion of right front wall of thorax, initial encounter: Secondary | ICD-10-CM | POA: Diagnosis not present

## 2016-03-16 DIAGNOSIS — E039 Hypothyroidism, unspecified: Secondary | ICD-10-CM | POA: Insufficient documentation

## 2016-03-16 DIAGNOSIS — Z951 Presence of aortocoronary bypass graft: Secondary | ICD-10-CM | POA: Insufficient documentation

## 2016-03-16 DIAGNOSIS — Z853 Personal history of malignant neoplasm of breast: Secondary | ICD-10-CM | POA: Diagnosis not present

## 2016-03-16 DIAGNOSIS — S279XXA Injury of unspecified intrathoracic organ, initial encounter: Secondary | ICD-10-CM | POA: Diagnosis not present

## 2016-03-16 DIAGNOSIS — Z87891 Personal history of nicotine dependence: Secondary | ICD-10-CM | POA: Diagnosis not present

## 2016-03-16 MED ORDER — HYDROCODONE-ACETAMINOPHEN 5-325 MG PO TABS
1.0000 | ORAL_TABLET | Freq: Once | ORAL | Status: AC
  Administered 2016-03-16: 1 via ORAL
  Filled 2016-03-16: qty 1

## 2016-03-16 MED ORDER — HYDROCODONE-ACETAMINOPHEN 5-325 MG PO TABS: 1.0000 | ORAL_TABLET | Freq: Four times a day (QID) | ORAL | 0 refills | Status: DC | PRN

## 2016-03-16 NOTE — Discharge Instructions (Signed)
Take Tylenol for mild pain or the pain medicine prescribed for bad pain. Don't take Tylenol together with the pain medicine prescribed as the, they can be dangerous to your liver. Call Dr. Criss Rosales to arrange to be seen in the office if having significant pain by next week
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
## 2016-03-25 ENCOUNTER — Encounter (HOSPITAL_BASED_OUTPATIENT_CLINIC_OR_DEPARTMENT_OTHER): Payer: Self-pay | Admitting: *Deleted

## 2016-03-25 ENCOUNTER — Emergency Department (HOSPITAL_BASED_OUTPATIENT_CLINIC_OR_DEPARTMENT_OTHER)
Admission: EM | Admit: 2016-03-25 | Discharge: 2016-03-25 | Disposition: A | Discharge status: Home / Self Care | Payer: Medicare Other | Attending: Emergency Medicine | Admitting: Emergency Medicine

## 2016-03-25 ENCOUNTER — Emergency Department (HOSPITAL_BASED_OUTPATIENT_CLINIC_OR_DEPARTMENT_OTHER): Payer: Medicare Other

## 2016-03-25 DIAGNOSIS — Y999 Unspecified external cause status: Secondary | ICD-10-CM | POA: Insufficient documentation

## 2016-03-25 DIAGNOSIS — I10 Essential (primary) hypertension: Secondary | ICD-10-CM | POA: Diagnosis not present

## 2016-03-25 DIAGNOSIS — Y939 Activity, unspecified: Secondary | ICD-10-CM | POA: Insufficient documentation

## 2016-03-25 DIAGNOSIS — Y92410 Unspecified street and highway as the place of occurrence of the external cause: Secondary | ICD-10-CM | POA: Diagnosis not present

## 2016-03-25 DIAGNOSIS — Z87891 Personal history of nicotine dependence: Secondary | ICD-10-CM | POA: Insufficient documentation

## 2016-03-25 DIAGNOSIS — I2510 Atherosclerotic heart disease of native coronary artery without angina pectoris: Secondary | ICD-10-CM | POA: Insufficient documentation

## 2016-03-25 DIAGNOSIS — Z7982 Long term (current) use of aspirin: Secondary | ICD-10-CM | POA: Diagnosis not present

## 2016-03-25 DIAGNOSIS — R079 Chest pain, unspecified: Secondary | ICD-10-CM | POA: Diagnosis not present

## 2016-03-25 DIAGNOSIS — Z951 Presence of aortocoronary bypass graft: Secondary | ICD-10-CM | POA: Diagnosis not present

## 2016-03-25 DIAGNOSIS — Z853 Personal history of malignant neoplasm of breast: Secondary | ICD-10-CM | POA: Insufficient documentation

## 2016-03-25 DIAGNOSIS — R0789 Other chest pain: Secondary | ICD-10-CM | POA: Diagnosis not present

## 2016-03-25 DIAGNOSIS — S29011A Strain of muscle and tendon of front wall of thorax, initial encounter: Secondary | ICD-10-CM

## 2016-03-25 DIAGNOSIS — S299XXA Unspecified injury of thorax, initial encounter: Secondary | ICD-10-CM | POA: Diagnosis not present

## 2016-03-25 DIAGNOSIS — E039 Hypothyroidism, unspecified: Secondary | ICD-10-CM | POA: Insufficient documentation

## 2016-03-25 LAB — BASIC METABOLIC PANEL
Anion gap: 6 (ref 5–15)
BUN: 17 mg/dL (ref 6–20)
CO2: 20 mmol/L — ABNORMAL LOW (ref 22–32)
Calcium: 8.9 mg/dL (ref 8.9–10.3)
Chloride: 112 mmol/L — ABNORMAL HIGH (ref 101–111)
Creatinine, Ser: 0.93 mg/dL (ref 0.44–1.00)
GFR calc Af Amer: >60 mL/min (ref >60)
GFR calc non Af Amer: 58 mL/min — ABNORMAL LOW (ref >60)
Glucose, Bld: 91 mg/dL (ref 65–99)
Potassium: 3.9 mmol/L (ref 3.5–5.1)
Sodium: 138 mmol/L (ref 135–145)

## 2016-03-25 MED ORDER — CYCLOBENZAPRINE HCL 10 MG PO TABS: 10.0000 mg | ORAL_TABLET | Freq: Two times a day (BID) | ORAL | 0 refills | Status: DC | PRN

## 2016-03-25 MED ORDER — OXYCODONE-ACETAMINOPHEN 5-325 MG PO TABS: 1.0000 | ORAL_TABLET | Freq: Four times a day (QID) | ORAL | 0 refills | Status: DC | PRN

## 2016-03-25 MED ORDER — IOPAMIDOL (ISOVUE-300) INJECTION 61%
80.0000 mL | Freq: Once | INTRAVENOUS | Status: AC | PRN
  Administered 2016-03-25: 80 mL via INTRAVENOUS
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
## 2016-06-04 DIAGNOSIS — R7309 Other abnormal glucose: Secondary | ICD-10-CM | POA: Diagnosis not present

## 2016-06-04 DIAGNOSIS — I119 Hypertensive heart disease without heart failure: Secondary | ICD-10-CM | POA: Diagnosis not present

## 2016-06-04 DIAGNOSIS — E782 Mixed hyperlipidemia: Secondary | ICD-10-CM | POA: Diagnosis not present

## 2016-06-04 DIAGNOSIS — E039 Hypothyroidism, unspecified: Secondary | ICD-10-CM | POA: Diagnosis not present

## 2016-06-21 DIAGNOSIS — E782 Mixed hyperlipidemia: Secondary | ICD-10-CM | POA: Diagnosis not present

## 2016-06-21 DIAGNOSIS — I119 Hypertensive heart disease without heart failure: Secondary | ICD-10-CM | POA: Diagnosis not present

## 2016-06-21 DIAGNOSIS — R300 Dysuria: Secondary | ICD-10-CM | POA: Diagnosis not present

## 2016-08-02 DIAGNOSIS — R609 Edema, unspecified: Secondary | ICD-10-CM | POA: Diagnosis not present

## 2016-08-02 DIAGNOSIS — E119 Type 2 diabetes mellitus without complications: Secondary | ICD-10-CM | POA: Diagnosis not present

## 2016-08-02 DIAGNOSIS — I119 Hypertensive heart disease without heart failure: Secondary | ICD-10-CM | POA: Diagnosis not present

## 2016-08-02 DIAGNOSIS — E782 Mixed hyperlipidemia: Secondary | ICD-10-CM | POA: Diagnosis not present

## 2016-10-02 DIAGNOSIS — I119 Hypertensive heart disease without heart failure: Secondary | ICD-10-CM | POA: Diagnosis not present

## 2016-10-02 DIAGNOSIS — R7301 Impaired fasting glucose: Secondary | ICD-10-CM | POA: Diagnosis not present

## 2016-10-02 DIAGNOSIS — E039 Hypothyroidism, unspecified: Secondary | ICD-10-CM | POA: Diagnosis not present

## 2016-10-02 DIAGNOSIS — E782 Mixed hyperlipidemia: Secondary | ICD-10-CM | POA: Diagnosis not present

## 2016-10-21 DIAGNOSIS — M791 Myalgia: Secondary | ICD-10-CM | POA: Diagnosis not present

## 2016-10-29 DIAGNOSIS — I739 Peripheral vascular disease, unspecified: Secondary | ICD-10-CM | POA: Diagnosis not present

## 2016-10-29 DIAGNOSIS — I714 Abdominal aortic aneurysm, without rupture: Secondary | ICD-10-CM | POA: Diagnosis not present

## 2016-11-06 DIAGNOSIS — R0602 Shortness of breath: Secondary | ICD-10-CM | POA: Diagnosis not present

## 2016-11-06 DIAGNOSIS — I70213 Atherosclerosis of native arteries of extremities with intermittent claudication, bilateral legs: Secondary | ICD-10-CM | POA: Diagnosis not present

## 2016-11-06 DIAGNOSIS — I25119 Atherosclerotic heart disease of native coronary artery with unspecified angina pectoris: Secondary | ICD-10-CM | POA: Diagnosis not present

## 2016-11-06 DIAGNOSIS — I714 Abdominal aortic aneurysm, without rupture: Secondary | ICD-10-CM | POA: Diagnosis not present

## 2016-12-04 DIAGNOSIS — R7301 Impaired fasting glucose: Secondary | ICD-10-CM | POA: Diagnosis not present

## 2016-12-04 DIAGNOSIS — R002 Palpitations: Secondary | ICD-10-CM | POA: Diagnosis not present

## 2016-12-04 DIAGNOSIS — E782 Mixed hyperlipidemia: Secondary | ICD-10-CM | POA: Diagnosis not present

## 2016-12-04 DIAGNOSIS — E039 Hypothyroidism, unspecified: Secondary | ICD-10-CM | POA: Diagnosis not present

## 2016-12-12 DIAGNOSIS — I119 Hypertensive heart disease without heart failure: Secondary | ICD-10-CM | POA: Diagnosis not present

## 2016-12-12 DIAGNOSIS — R609 Edema, unspecified: Secondary | ICD-10-CM | POA: Diagnosis not present

## 2017-01-03 DIAGNOSIS — I10 Essential (primary) hypertension: Secondary | ICD-10-CM | POA: Diagnosis not present

## 2017-01-03 DIAGNOSIS — M62838 Other muscle spasm: Secondary | ICD-10-CM | POA: Diagnosis not present

## 2017-01-03 DIAGNOSIS — R7301 Impaired fasting glucose: Secondary | ICD-10-CM | POA: Diagnosis not present

## 2017-01-03 DIAGNOSIS — R252 Cramp and spasm: Secondary | ICD-10-CM | POA: Diagnosis not present

## 2017-01-03 DIAGNOSIS — J301 Allergic rhinitis due to pollen: Secondary | ICD-10-CM | POA: Diagnosis not present

## 2017-01-24 DIAGNOSIS — E119 Type 2 diabetes mellitus without complications: Secondary | ICD-10-CM | POA: Diagnosis not present

## 2017-01-24 DIAGNOSIS — I10 Essential (primary) hypertension: Secondary | ICD-10-CM | POA: Diagnosis not present

## 2017-01-24 DIAGNOSIS — I119 Hypertensive heart disease without heart failure: Secondary | ICD-10-CM | POA: Diagnosis not present

## 2017-01-24 DIAGNOSIS — R7309 Other abnormal glucose: Secondary | ICD-10-CM | POA: Diagnosis not present
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
## 2017-01-28 DIAGNOSIS — Z961 Presence of intraocular lens: Secondary | ICD-10-CM | POA: Diagnosis not present

## 2017-01-28 DIAGNOSIS — H43391 Other vitreous opacities, right eye: Secondary | ICD-10-CM | POA: Diagnosis not present

## 2017-03-07 DIAGNOSIS — E782 Mixed hyperlipidemia: Secondary | ICD-10-CM | POA: Diagnosis not present

## 2017-03-07 DIAGNOSIS — E039 Hypothyroidism, unspecified: Secondary | ICD-10-CM | POA: Diagnosis not present

## 2017-03-07 DIAGNOSIS — I119 Hypertensive heart disease without heart failure: Secondary | ICD-10-CM | POA: Diagnosis not present

## 2017-03-07 DIAGNOSIS — E119 Type 2 diabetes mellitus without complications: Secondary | ICD-10-CM | POA: Diagnosis not present

## 2017-03-08 DIAGNOSIS — E119 Type 2 diabetes mellitus without complications: Secondary | ICD-10-CM | POA: Diagnosis not present

## 2017-03-08 DIAGNOSIS — E039 Hypothyroidism, unspecified: Secondary | ICD-10-CM | POA: Diagnosis not present

## 2017-03-08 DIAGNOSIS — I119 Hypertensive heart disease without heart failure: Secondary | ICD-10-CM | POA: Diagnosis not present

## 2017-03-08 DIAGNOSIS — E782 Mixed hyperlipidemia: Secondary | ICD-10-CM | POA: Diagnosis not present

## 2017-03-08 DIAGNOSIS — R945 Abnormal results of liver function studies: Secondary | ICD-10-CM | POA: Diagnosis not present

## 2017-05-09 DIAGNOSIS — I70213 Atherosclerosis of native arteries of extremities with intermittent claudication, bilateral legs: Secondary | ICD-10-CM | POA: Diagnosis not present

## 2017-05-09 DIAGNOSIS — R0602 Shortness of breath: Secondary | ICD-10-CM | POA: Diagnosis not present

## 2017-05-09 DIAGNOSIS — I714 Abdominal aortic aneurysm, without rupture: Secondary | ICD-10-CM | POA: Diagnosis not present

## 2017-05-09 DIAGNOSIS — I25119 Atherosclerotic heart disease of native coronary artery with unspecified angina pectoris: Secondary | ICD-10-CM | POA: Diagnosis not present

## 2017-06-06 DIAGNOSIS — I10 Essential (primary) hypertension: Secondary | ICD-10-CM | POA: Diagnosis not present

## 2017-06-06 DIAGNOSIS — J219 Acute bronchiolitis, unspecified: Secondary | ICD-10-CM | POA: Diagnosis not present

## 2017-06-06 DIAGNOSIS — E118 Type 2 diabetes mellitus with unspecified complications: Secondary | ICD-10-CM | POA: Diagnosis not present

## 2017-06-06 DIAGNOSIS — E039 Hypothyroidism, unspecified: Secondary | ICD-10-CM | POA: Diagnosis not present

## 2017-07-03 ENCOUNTER — Emergency Department (HOSPITAL_BASED_OUTPATIENT_CLINIC_OR_DEPARTMENT_OTHER): Payer: Medicare Other

## 2017-07-03 ENCOUNTER — Other Ambulatory Visit: Payer: Self-pay

## 2017-07-03 ENCOUNTER — Encounter (HOSPITAL_BASED_OUTPATIENT_CLINIC_OR_DEPARTMENT_OTHER): Payer: Self-pay | Admitting: Adult Health

## 2017-07-03 ENCOUNTER — Emergency Department (HOSPITAL_BASED_OUTPATIENT_CLINIC_OR_DEPARTMENT_OTHER)
Admission: EM | Admit: 2017-07-03 | Discharge: 2017-07-03 | Disposition: A | Discharge status: Home / Self Care | Payer: Medicare Other | Attending: Physician Assistant | Admitting: Physician Assistant

## 2017-07-03 DIAGNOSIS — N3000 Acute cystitis without hematuria: Secondary | ICD-10-CM | POA: Insufficient documentation

## 2017-07-03 DIAGNOSIS — Z79899 Other long term (current) drug therapy: Secondary | ICD-10-CM | POA: Insufficient documentation

## 2017-07-03 DIAGNOSIS — I10 Essential (primary) hypertension: Secondary | ICD-10-CM | POA: Insufficient documentation

## 2017-07-03 DIAGNOSIS — I2510 Atherosclerotic heart disease of native coronary artery without angina pectoris: Secondary | ICD-10-CM | POA: Insufficient documentation

## 2017-07-03 DIAGNOSIS — Z951 Presence of aortocoronary bypass graft: Secondary | ICD-10-CM | POA: Diagnosis not present

## 2017-07-03 DIAGNOSIS — R0781 Pleurodynia: Secondary | ICD-10-CM | POA: Diagnosis not present

## 2017-07-03 DIAGNOSIS — Z853 Personal history of malignant neoplasm of breast: Secondary | ICD-10-CM | POA: Diagnosis not present

## 2017-07-03 DIAGNOSIS — Z955 Presence of coronary angioplasty implant and graft: Secondary | ICD-10-CM | POA: Diagnosis not present

## 2017-07-03 DIAGNOSIS — E039 Hypothyroidism, unspecified: Secondary | ICD-10-CM | POA: Insufficient documentation

## 2017-07-03 DIAGNOSIS — Z87891 Personal history of nicotine dependence: Secondary | ICD-10-CM | POA: Diagnosis not present

## 2017-07-03 DIAGNOSIS — R0789 Other chest pain: Secondary | ICD-10-CM | POA: Diagnosis present

## 2017-07-03 LAB — URINALYSIS, MICROSCOPIC (REFLEX)

## 2017-07-03 LAB — URINALYSIS, ROUTINE W REFLEX MICROSCOPIC
Bilirubin Urine: NEGATIVE
Glucose, UA: NEGATIVE mg/dL
Ketones, ur: NEGATIVE mg/dL
Nitrite: NEGATIVE
Protein, ur: 30 mg/dL — AB
Specific Gravity, Urine: 1.02 (ref 1.005–1.030)
pH: 6 (ref 5.0–8.0)

## 2017-07-03 MED ORDER — CEPHALEXIN 250 MG PO CAPS
500.0000 mg | ORAL_CAPSULE | Freq: Once | ORAL | Status: AC
Start: 2017-07-03 — End: 2017-07-03
  Administered 2017-07-03: 500 mg via ORAL
  Filled 2017-07-03: qty 2

## 2017-07-03 MED ORDER — CEPHALEXIN 500 MG PO CAPS: 500.0000 mg | ORAL_CAPSULE | Freq: Four times a day (QID) | ORAL | 0 refills | Status: DC

## 2017-07-03 MED FILL — CEPHALEXIN 500 MG CAPSULE: 500 | 10 days supply | Qty: 40 | Fill #0

## 2017-07-03 NOTE — ED Triage Notes (Signed)
PResents with left lfank pain described as a "Sticking pain" that is constant and began Saturday while driving to the grocery store. THe pain moves and is sometimes worse up into her chest and sometimes worse down into her groin depending on positioning. DEnies SOB and nausea. Endorses frequent urination that is abnormal for her. Denies hematuria and dysuria.

## 2017-07-03 NOTE — ED Provider Notes (Signed)
[facility] EMERGENCY DEPARTMENT Provider Note   CSN: [id number] Arrival date & time: 07/03/17  2440     History   Chief Complaint Chief Complaint  Patient presents with  . Flank Pain    HPI [name] is a 76 y.o. female.  HPI  Patient is a 76 year old female with past medical history significant for CAD hypothyroidism hypertension hyperlipidemia peripheral vascular disease and CABG presenting today with left lateral chest wall pain.  Patient reports several complaints.  She reports she had an achiness in her right femur.  In addition she has stabbing pains occasionally in her left lateral chest wall.  It is mid axillary line lateral to her left breast.  It is not made worse by exertion.  No shortness of breath no chest pain no cough.  No congestion no fever patient does report urinary frequency.  No nausea no vomiting.  No other complaints.  Patient denies having used her muscles differently than usual or slept differently than usual.  Past Medical History:  Diagnosis Date  . Breast cancer [facility])    s/p mastectomy  . CAD (coronary artery disease)    s/p CABG  . DDD (degenerative disc disease), lumbar   . Discoid lupus   . Dyspnea   . GERD (gastroesophageal reflux disease)   . Gout   . HLD (hyperlipidemia)   . HTN (hypertension)   . Hypothyroidism   . Low back pain   . Obesity   . OSA (obstructive sleep apnea)    did not tolerate CPAP  . Osteoporosis   . PVD (peripheral vascular disease) ([place])   . Renal disorder     Patient Active Problem List   Diagnosis Date Noted  . ARF (acute renal failure) ([place]) 04/10/2013  . Hyperkalemia 04/10/2013  . Hypotension, unspecified 04/10/2013  . Sinus bradycardia 04/10/2013  . DYSPNEA 01/03/2010  . LUPUS ERYTHEMATOSUS, DISCOID 06/12/2007  . HYPOTHYROIDISM 06/11/2007  . HYPERLIPIDEMIA 06/11/2007  . GOUT 06/11/2007  . HYPERTENSION 06/11/2007  . CORONARY ARTERY DISEASE 06/11/2007  . PERIPHERAL VASCULAR DISEASE  06/11/2007  . GERD 06/11/2007  . LOW BACK PAIN 06/11/2007  . OSTEOPOROSIS 06/11/2007  . BREAST CANCER, HX OF 06/11/2007    Past Surgical History:  Procedure Laterality Date  . ANKLE SURGERY  7/05   left  . CORONARY ANGIOPLASTY WITH STENT PLACEMENT  2003   7 total stents placed  . CORONARY ARTERY BYPASS GRAFT  1997  . ELBOW SURGERY     right  . hysterectomy - unknown type    . MASTECTOMY  1985   right  . OOPHORECTOMY    . RENAL ARTERY STENT      OB History    No data available       Home Medications    Prior to Admission medications   Medication Sig Start Date End Date Taking? Authorizing Provider  amLODipine-benazepril (LOTREL) 10-20 MG per capsule Take 1 capsule by mouth daily.     Yes [provider]  aspirin 81 MG tablet Take 1 tablet (81 mg total) by mouth daily. 12/10/10  Yes Larey Dresser, MD  atorvastatin (LIPITOR) 40 MG tablet Take 40 mg by mouth daily.     Yes [provider]  carvedilol (COREG) 6.25 MG tablet Take 6.25 mg by mouth 2 (two) times daily with a meal.   Yes [provider]  guanFACINE (TENEX) 2 MG tablet Take 2 mg by mouth at bedtime.     Yes [provider]  levothyroxine (SYNTHROID, LEVOTHROID) 125 MCG tablet Take 100 mcg by mouth daily.    Yes [provider]  multivitamin-iron-minerals-folic acid (CENTRUM) chewable tablet Chew 1 tablet by mouth daily.   Yes [provider]  cyclobenzaprine (FLEXERIL) 10 MG tablet Take 1 tablet (10 mg total) by mouth 2 (two) times daily as needed for muscle spasms. 03/25/16   Blanchie Dessert, MD  HYDROcodone-acetaminophen (NORCO) 5-325 MG tablet Take 1 tablet by mouth every 6 (six) hours as needed for moderate pain or severe pain. 03/16/16   Orlie Dakin, MD  Multiple Vitamin (MULTIVITAMIN) capsule Take 1 capsule by mouth daily.    [provider]  oxyCODONE-acetaminophen (PERCOCET/ROXICET) 5-325 MG tablet Take 1 tablet by mouth every 6 (six) hours  as needed for severe pain. 03/25/16   Blanchie Dessert, MD    Family History Family History  Problem Relation Age of Onset  . Coronary artery disease Unknown   . Diabetes Unknown   . Hypertension Unknown   . Aneurysm Unknown   . Stroke Unknown     Social History Social History   Tobacco Use  . Smoking status: Former Smoker    Last attempt to quit: 07/23/1995    Years since quitting: 21.9  . Smokeless tobacco: Never Used  Substance Use Topics  . Alcohol use: No  . Drug use: No     Allergies   Amoxicillin and Clarithromycin   Review of Systems Review of Systems  Constitutional: Negative for activity change, fatigue and fever.  HENT: Negative for congestion.   Respiratory: Negative for shortness of breath.        Chest wall pain  Gastrointestinal: Negative for abdominal pain, nausea and vomiting.  Genitourinary: Positive for frequency. Negative for dyspareunia and dysuria.  Musculoskeletal: Negative for back pain.  Skin: Negative for pallor and rash.  Neurological: Negative for dizziness, weakness and headaches.  All other systems reviewed and are negative.    Physical Exam Updated Vital Signs BP (!) 153/63 (BP Location: Right Arm)   Pulse (!) 55   Temp 98.3 F (36.8 C) (Oral)   Resp 18   Ht 5\' 5"  (1.651 m)   Wt 94.3 kg (208 lb)   SpO2 97%   BMI 34.61 kg/m   Physical Exam  Constitutional: She is oriented to person, place, and time. She appears well-developed and well-nourished.  HENT:  Head: Normocephalic and atraumatic.  Eyes: Right eye exhibits no discharge. Left eye exhibits no discharge.  Cardiovascular: Normal rate, regular rhythm and normal heart sounds.  No murmur heard. Pulmonary/Chest: Effort normal and breath sounds normal. She has no wheezes. She has no rales.  Tenderness lateral mid axillary line to the left breast.  No pain with movement.  Tenderness to touch over the lateral ribs.  Abdominal: Soft. She exhibits no distension. There is no  tenderness.  Neurological: She is oriented to person, place, and time.  Skin: Skin is warm and dry. She is not diaphoretic.  Psychiatric: She has a normal mood and affect.  Nursing note and vitals reviewed.    ED Treatments / Results  Labs (all labs ordered are listed, but only abnormal results are displayed) Labs Reviewed  URINALYSIS, ROUTINE W REFLEX MICROSCOPIC    EKG  EKG Interpretation None       Radiology No results found.  Procedures Procedures (including critical care time)  Medications Ordered in ED Medications - No data to display   Initial Impression / Assessment and Plan / ED Course  I have  reviewed the triage vital signs and the nursing notes.  Pertinent labs & imaging results that were available during my care of the patient were reviewed by me and considered in my medical decision making (see chart for details).    Patient is a 76 year old female with past medical history significant for CAD hypothyroidism hypertension hyperlipidemia peripheral vascular disease and CABG presenting today with left lateral chest wall pain.  Patient reports several complaints.  She reports she had an achiness in her right femur.  In addition she has stabbing pains occasionally in her left lateral chest wall.  It is mid axillary line lateral to her left breast.  It is not made worse by exertion.  No shortness of breath no chest pain no cough.  No congestion no fever patient does report urinary frequency.  No nausea no vomiting.  No other complaints.  Patient denies having used her muscles differently than usual or slept differently than usual\\\  .10:21 AM Patient has extensive past medical history but is here today with what appears to be a very low acuity symptom.  She has tenderness to palpation of the lateral chest wall.  She got no other symptoms except urinary frequency.  She denies fatigue, feelings of illness. Will get xray, run urine. Reassuring phjysical exam and vitals.    UA shows infection. Will treat and discharge home.     Final Clinical Impressions(s) / ED Diagnoses   Final diagnoses:  None    ED Discharge Orders    None       Macarthur Critchley, MD 07/03/17 1502

## 2017-07-03 NOTE — Discharge Instructions (Signed)
Please take the antibiotics.  Please return immediately if you have any vomiting, fever or unable to take antibiotics, or other concerns.

## 2017-07-05 LAB — URINE CULTURE: Culture: >=100000 — AB

## 2017-07-06 ENCOUNTER — Telehealth: Payer: Self-pay

## 2017-07-06 NOTE — Telephone Encounter (Signed)
Post ED Visit - Positive Culture Follow-up  Culture report reviewed by antimicrobial stewardship pharmacist:  []  Elenor Quinones, Pharm.D. []  Heide Guile, Pharm.D., BCPS AQ-ID []  Parks Neptune, Pharm.D., BCPS []  Alycia Rossetti, Pharm.D., BCPS []  [place], [place].D., BCPS, AAHIVP []  Legrand Como, Pharm.D., BCPS, AAHIVP []  Salome Arnt, PharmD, BCPS []  Dimitri Ped, PharmD, BCPS []  Vincenza Hews, PharmD, BCPS [facility] Pharm D Positive urine culture Treated with Cephalexin, organism sensitive to the same and no further patient follow-up is required at this time.  Genia Del 07/06/2017, 10:46 AM

## 2017-09-09 DIAGNOSIS — E118 Type 2 diabetes mellitus with unspecified complications: Secondary | ICD-10-CM | POA: Diagnosis not present

## 2017-09-09 DIAGNOSIS — R7309 Other abnormal glucose: Secondary | ICD-10-CM | POA: Diagnosis not present

## 2017-09-09 DIAGNOSIS — E785 Hyperlipidemia, unspecified: Secondary | ICD-10-CM | POA: Diagnosis not present

## 2017-09-09 DIAGNOSIS — I10 Essential (primary) hypertension: Secondary | ICD-10-CM | POA: Diagnosis not present

## 2017-09-09 DIAGNOSIS — M1000 Idiopathic gout, unspecified site: Secondary | ICD-10-CM | POA: Diagnosis not present

## 2017-09-09 DIAGNOSIS — I119 Hypertensive heart disease without heart failure: Secondary | ICD-10-CM | POA: Diagnosis not present

## 2017-10-24 DIAGNOSIS — I10 Essential (primary) hypertension: Secondary | ICD-10-CM | POA: Diagnosis not present

## 2017-10-28 DIAGNOSIS — I10 Essential (primary) hypertension: Secondary | ICD-10-CM | POA: Diagnosis not present

## 2017-11-11 DIAGNOSIS — E119 Type 2 diabetes mellitus without complications: Secondary | ICD-10-CM | POA: Diagnosis not present

## 2017-11-11 DIAGNOSIS — R600 Localized edema: Secondary | ICD-10-CM | POA: Diagnosis not present

## 2017-11-11 DIAGNOSIS — I10 Essential (primary) hypertension: Secondary | ICD-10-CM | POA: Diagnosis not present

## 2017-12-24 DIAGNOSIS — E039 Hypothyroidism, unspecified: Secondary | ICD-10-CM | POA: Diagnosis not present

## 2017-12-24 DIAGNOSIS — I119 Hypertensive heart disease without heart failure: Secondary | ICD-10-CM | POA: Diagnosis not present

## 2017-12-24 DIAGNOSIS — E118 Type 2 diabetes mellitus with unspecified complications: Secondary | ICD-10-CM | POA: Diagnosis not present

## 2017-12-30 DIAGNOSIS — Z853 Personal history of malignant neoplasm of breast: Secondary | ICD-10-CM | POA: Diagnosis not present

## 2017-12-30 DIAGNOSIS — Z1231 Encounter for screening mammogram for malignant neoplasm of breast: Secondary | ICD-10-CM | POA: Diagnosis not present

## 2017-12-30 DIAGNOSIS — Z1212 Encounter for screening for malignant neoplasm of rectum: Secondary | ICD-10-CM | POA: Diagnosis not present

## 2017-12-30 DIAGNOSIS — Z1211 Encounter for screening for malignant neoplasm of colon: Secondary | ICD-10-CM | POA: Diagnosis not present

## 2018-01-02 DIAGNOSIS — N6321 Unspecified lump in the left breast, upper outer quadrant: Secondary | ICD-10-CM | POA: Diagnosis not present

## 2018-01-02 DIAGNOSIS — N6320 Unspecified lump in the left breast, unspecified quadrant: Secondary | ICD-10-CM | POA: Diagnosis not present

## 2018-01-29 DIAGNOSIS — Z961 Presence of intraocular lens: Secondary | ICD-10-CM | POA: Diagnosis not present

## 2018-04-03 DIAGNOSIS — E039 Hypothyroidism, unspecified: Secondary | ICD-10-CM | POA: Diagnosis not present

## 2018-04-03 DIAGNOSIS — I119 Hypertensive heart disease without heart failure: Secondary | ICD-10-CM | POA: Diagnosis not present

## 2018-04-03 DIAGNOSIS — E119 Type 2 diabetes mellitus without complications: Secondary | ICD-10-CM | POA: Diagnosis not present

## 2018-05-29 DIAGNOSIS — I714 Abdominal aortic aneurysm, without rupture: Secondary | ICD-10-CM | POA: Diagnosis not present

## 2018-06-22 DIAGNOSIS — J219 Acute bronchiolitis, unspecified: Secondary | ICD-10-CM | POA: Diagnosis not present

## 2018-06-22 DIAGNOSIS — M1000 Idiopathic gout, unspecified site: Secondary | ICD-10-CM | POA: Diagnosis not present

## 2018-07-20 DIAGNOSIS — E039 Hypothyroidism, unspecified: Secondary | ICD-10-CM | POA: Diagnosis not present

## 2018-07-20 DIAGNOSIS — M1000 Idiopathic gout, unspecified site: Secondary | ICD-10-CM | POA: Diagnosis not present

## 2018-07-20 DIAGNOSIS — E119 Type 2 diabetes mellitus without complications: Secondary | ICD-10-CM | POA: Diagnosis not present

## 2018-07-20 DIAGNOSIS — I119 Hypertensive heart disease without heart failure: Secondary | ICD-10-CM | POA: Diagnosis not present

## 2018-07-28 DIAGNOSIS — R0602 Shortness of breath: Secondary | ICD-10-CM | POA: Diagnosis not present

## 2018-07-28 DIAGNOSIS — I77811 Abdominal aortic ectasia: Secondary | ICD-10-CM | POA: Diagnosis not present

## 2018-07-28 DIAGNOSIS — I739 Peripheral vascular disease, unspecified: Secondary | ICD-10-CM | POA: Diagnosis not present

## 2018-07-28 DIAGNOSIS — I25119 Atherosclerotic heart disease of native coronary artery with unspecified angina pectoris: Secondary | ICD-10-CM | POA: Diagnosis not present

## 2018-08-06 DIAGNOSIS — M1000 Idiopathic gout, unspecified site: Secondary | ICD-10-CM | POA: Diagnosis not present

## 2018-08-06 DIAGNOSIS — E119 Type 2 diabetes mellitus without complications: Secondary | ICD-10-CM | POA: Diagnosis not present

## 2018-08-06 DIAGNOSIS — I10 Essential (primary) hypertension: Secondary | ICD-10-CM | POA: Diagnosis not present

## 2018-10-18 ENCOUNTER — Emergency Department (HOSPITAL_BASED_OUTPATIENT_CLINIC_OR_DEPARTMENT_OTHER)
Admission: EM | Admit: 2018-10-18 | Discharge: 2018-10-18 | Disposition: A | Discharge status: Home / Self Care | Payer: Medicare Other | Attending: Emergency Medicine | Admitting: Emergency Medicine

## 2018-10-18 ENCOUNTER — Emergency Department (HOSPITAL_BASED_OUTPATIENT_CLINIC_OR_DEPARTMENT_OTHER): Payer: Medicare Other

## 2018-10-18 ENCOUNTER — Encounter (HOSPITAL_BASED_OUTPATIENT_CLINIC_OR_DEPARTMENT_OTHER): Payer: Self-pay | Admitting: *Deleted

## 2018-10-18 ENCOUNTER — Other Ambulatory Visit: Payer: Self-pay

## 2018-10-18 DIAGNOSIS — E039 Hypothyroidism, unspecified: Secondary | ICD-10-CM | POA: Insufficient documentation

## 2018-10-18 DIAGNOSIS — Z79899 Other long term (current) drug therapy: Secondary | ICD-10-CM | POA: Diagnosis not present

## 2018-10-18 DIAGNOSIS — I2510 Atherosclerotic heart disease of native coronary artery without angina pectoris: Secondary | ICD-10-CM | POA: Diagnosis not present

## 2018-10-18 DIAGNOSIS — Z87891 Personal history of nicotine dependence: Secondary | ICD-10-CM | POA: Insufficient documentation

## 2018-10-18 DIAGNOSIS — Z7189 Other specified counseling: Secondary | ICD-10-CM | POA: Diagnosis not present

## 2018-10-18 DIAGNOSIS — I10 Essential (primary) hypertension: Secondary | ICD-10-CM | POA: Diagnosis not present

## 2018-10-18 DIAGNOSIS — Z853 Personal history of malignant neoplasm of breast: Secondary | ICD-10-CM | POA: Diagnosis not present

## 2018-10-18 DIAGNOSIS — R51 Headache: Secondary | ICD-10-CM | POA: Insufficient documentation

## 2018-10-18 DIAGNOSIS — Z951 Presence of aortocoronary bypass graft: Secondary | ICD-10-CM | POA: Insufficient documentation

## 2018-10-18 DIAGNOSIS — R0602 Shortness of breath: Secondary | ICD-10-CM

## 2018-10-18 DIAGNOSIS — R519 Headache, unspecified: Secondary | ICD-10-CM

## 2018-10-18 LAB — BASIC METABOLIC PANEL
Anion gap: 5 (ref 5–15)
BUN: 13 mg/dL (ref 8–23)
CO2: 24 mmol/L (ref 22–32)
Calcium: 9.8 mg/dL (ref 8.9–10.3)
Chloride: 110 mmol/L (ref 98–111)
Creatinine, Ser: 1.05 mg/dL — ABNORMAL HIGH (ref 0.44–1.00)
GFR calc Af Amer: 59 mL/min — ABNORMAL LOW (ref >60)
GFR calc non Af Amer: 51 mL/min — ABNORMAL LOW (ref >60)
Glucose, Bld: 98 mg/dL (ref 70–99)
Potassium: 4.4 mmol/L (ref 3.5–5.1)
Sodium: 139 mmol/L (ref 135–145)

## 2018-10-18 LAB — CBC WITH DIFFERENTIAL/PLATELET
Abs Immature Granulocytes: 0.01 10*3/uL (ref 0.00–0.07)
Basophils Absolute: 0.1 10*3/uL (ref 0.0–0.1)
Basophils Relative: 1 %
Eosinophils Absolute: 0.2 10*3/uL (ref 0.0–0.5)
Eosinophils Relative: 3 %
HCT: 38.5 % (ref 36.0–46.0)
Hemoglobin: 11.8 g/dL — ABNORMAL LOW (ref 12.0–15.0)
Immature Granulocytes: 0 %
Lymphocytes Relative: 36 %
Lymphs Abs: 2.1 10*3/uL (ref 0.7–4.0)
MCH: 27.6 pg (ref 26.0–34.0)
MCHC: 30.6 g/dL (ref 30.0–36.0)
MCV: 90.2 fL (ref 80.0–100.0)
Monocytes Absolute: 0.7 10*3/uL (ref 0.1–1.0)
Monocytes Relative: 11 %
Neutro Abs: 3 10*3/uL (ref 1.7–7.7)
Neutrophils Relative %: 49 %
Platelets: 198 10*3/uL (ref 150–400)
RBC: 4.27 MIL/uL (ref 3.87–5.11)
RDW: 15.2 % (ref 11.5–15.5)
WBC: 6 10*3/uL (ref 4.0–10.5)
nRBC: 0 % (ref 0.0–0.2)

## 2018-10-18 LAB — TROPONIN I: Troponin I: <0.03 ng/mL (ref <0.03)

## 2018-10-18 NOTE — Discharge Instructions (Signed)
You most likely have a viral infection causing cough and nausea in your son and headache for you.  We don't see other emergent cause of your shortness of breath at this time, including no sign of blood clots, heart attack, pneumonia, collapsed lung or heart failure.  For your viral illness, we recommend supportive care. In the setting of COVID19 pandemic with test limitations, it is difficult to exclude this virus as a possible cause of your symptoms.  We recommend self-quarantine for 7 days after symptoms started and 3 days after resolution of fever.

## 2018-10-18 NOTE — ED Triage Notes (Signed)
Pt reports SOB x 3 days, denies fever and cough. States she has a "traveling headache" but they don't last. Hx of "heart problems"

## 2018-10-18 NOTE — ED Provider Notes (Signed)
[facility] Provider Note   CSN: [id number] Arrival date & time: 10/18/18  1335    History   Chief Complaint Chief Complaint  Patient presents with   Shortness of Breath    HPI [name] is a 78 y.o. female with a past medical history of CAD, GERD, hypertension, hyperlipidemia who presents to ED for 3-day history of shortness of breath, headache.  States that both shortness of breath and headache have been intermittent with no specific aggravating or alleviating factor.  Regarding the shortness of breath she states that it is present intermittently while standing, sitting, laying down or walking.  No history of similar symptoms in the past.  Regarding her headache, she reports gradual onset of headache 3 days ago which has intermittently improved.  Episodes will last for about 15 minutes and will improve for 45 minutes before returning.  She took 1 dose of ibuprofen when symptoms began 3 days ago but has not taken any medication since.  Denies any leg swelling, chest pain, fever, cough, vision changes, numbness in arms or legs, injuries or falls, hemoptysis, history of DVT, PE or recent immobilization.  She denies any recent travel or contact with anyone who has tested positive for covid19.     HPI  Past Medical History:  Diagnosis Date   Breast cancer ([place])    s/p mastectomy   CAD (coronary artery disease)    s/p CABG   DDD (degenerative disc disease), lumbar    Discoid lupus    Dyspnea    GERD (gastroesophageal reflux disease)    Gout    HLD (hyperlipidemia)    HTN (hypertension)    Hypothyroidism    Low back pain    Obesity    OSA (obstructive sleep apnea)    did not tolerate CPAP   Osteoporosis    PVD (peripheral vascular disease) ([place])    Renal disorder     Patient Active Problem List   Diagnosis Date Noted   ARF (acute renal failure) ([place]) 04/10/2013   Hyperkalemia 04/10/2013   Hypotension, unspecified  04/10/2013   Sinus bradycardia 04/10/2013   DYSPNEA 01/03/2010   LUPUS ERYTHEMATOSUS, DISCOID 06/12/2007   HYPOTHYROIDISM 06/11/2007   HYPERLIPIDEMIA 06/11/2007   GOUT 06/11/2007   HYPERTENSION 06/11/2007   CORONARY ARTERY DISEASE 06/11/2007   PERIPHERAL VASCULAR DISEASE 06/11/2007   GERD 06/11/2007   LOW BACK PAIN 06/11/2007   OSTEOPOROSIS 06/11/2007   BREAST CANCER, HX OF 06/11/2007    Past Surgical History:  Procedure Laterality Date   ANKLE SURGERY  7/05   left   CORONARY ANGIOPLASTY WITH STENT PLACEMENT  2003   7 total stents placed   CORONARY ARTERY BYPASS GRAFT  1997   ELBOW SURGERY     right   hysterectomy - unknown type     MASTECTOMY  1985   right   OOPHORECTOMY     RENAL ARTERY STENT       OB History   No obstetric history on file.      Home Medications    Prior to Admission medications   Medication Sig Start Date End Date Taking? Authorizing Provider  amLODipine-benazepril (LOTREL) 10-20 MG per capsule Take 1 capsule by mouth daily.      [provider]  aspirin 81 MG tablet Take 1 tablet (81 mg total) by mouth daily. 12/10/10   Larey Dresser, MD  atorvastatin (LIPITOR) 40 MG tablet Take 40 mg by mouth daily.  [provider]  carvedilol (COREG) 6.25 MG tablet Take 6.25 mg by mouth 2 (two) times daily with a meal.    [provider]  cephALEXin (KEFLEX) 500 MG capsule Take 1 capsule (500 mg total) by mouth 4 (four) times daily. 07/03/17   Mackuen, Courteney Lyn, MD  cyclobenzaprine (FLEXERIL) 10 MG tablet Take 1 tablet (10 mg total) by mouth 2 (two) times daily as needed for muscle spasms. 03/25/16   Blanchie Dessert, MD  guanFACINE (TENEX) 2 MG tablet Take 2 mg by mouth at bedtime.      [provider]  HYDROcodone-acetaminophen (NORCO) 5-325 MG tablet Take 1 tablet by mouth every 6 (six) hours as needed for moderate pain or severe pain. 03/16/16   Orlie Dakin, MD  levothyroxine (SYNTHROID,  LEVOTHROID) 125 MCG tablet Take 100 mcg by mouth daily.     [provider]  Multiple Vitamin (MULTIVITAMIN) capsule Take 1 capsule by mouth daily.    [provider]  multivitamin-iron-minerals-folic acid (CENTRUM) chewable tablet Chew 1 tablet by mouth daily.    [provider]  oxyCODONE-acetaminophen (PERCOCET/ROXICET) 5-325 MG tablet Take 1 tablet by mouth every 6 (six) hours as needed for severe pain. 03/25/16   Blanchie Dessert, MD    Family History Family History  Problem Relation Age of Onset   Coronary artery disease Other    Diabetes Other    Hypertension Other    Aneurysm Other    Stroke Other     Social History Social History   Tobacco Use   Smoking status: Former Smoker    Last attempt to quit: 07/23/1995    Years since quitting: 23.2   Smokeless tobacco: Never Used  Substance Use Topics   Alcohol use: No   Drug use: No     Allergies   Amoxicillin and Clarithromycin   Review of Systems Review of Systems  Constitutional: Negative for appetite change, chills and fever.  HENT: Negative for ear pain, rhinorrhea, sneezing and sore throat.   Eyes: Negative for photophobia and visual disturbance.  Respiratory: Positive for shortness of breath. Negative for cough, chest tightness and wheezing.   Cardiovascular: Negative for chest pain and palpitations.  Gastrointestinal: Negative for abdominal pain, blood in stool, constipation, diarrhea, nausea and vomiting.  Genitourinary: Negative for dysuria, hematuria and urgency.  Musculoskeletal: Negative for myalgias.  Skin: Negative for rash.  Neurological: Positive for headaches. Negative for dizziness, weakness and light-headedness.     Physical Exam Updated Vital Signs BP (!) 156/66    Pulse 66    Temp 98.4 F (36.9 C) (Oral)    Resp 19    Ht 5\' 5"  (1.651 m)    Wt 93.4 kg    SpO2 96%    BMI 34.28 kg/m   Physical Exam Vitals signs and nursing note reviewed.  Constitutional:       General: She is not in acute distress.    Appearance: She is well-developed.  HENT:     Head: Normocephalic and atraumatic.     Nose: Nose normal.  Eyes:     General: No scleral icterus.       Left eye: No discharge.     Conjunctiva/sclera: Conjunctivae normal.  Neck:     Musculoskeletal: Normal range of motion and neck supple.  Cardiovascular:     Rate and Rhythm: Normal rate and regular rhythm.     Heart sounds: Normal heart sounds. No murmur. No friction rub. No gallop.   Pulmonary:  Effort: Pulmonary effort is normal. No respiratory distress.     Breath sounds: Normal breath sounds.  Abdominal:     General: Bowel sounds are normal. There is no distension.     Palpations: Abdomen is soft.     Tenderness: There is no abdominal tenderness. There is no guarding.  Musculoskeletal: Normal range of motion.     Comments: No lower extremity edema, erythema or calf tenderness bilaterally.  Skin:    General: Skin is warm and dry.     Findings: No rash.  Neurological:     General: No focal deficit present.     Mental Status: She is alert and oriented to person, place, and time.     Cranial Nerves: No cranial nerve deficit.     Sensory: No sensory deficit.     Motor: No weakness or abnormal muscle tone.     Coordination: Coordination normal.     Comments: Pupils reactive. No facial asymmetry noted. Cranial nerves appear grossly intact. Sensation intact to light touch on face, BUE and BLE. Strength 5/5 in BUE and BLE.       ED Treatments / Results  Labs (all labs ordered are listed, but only abnormal results are displayed) Labs Reviewed  BASIC METABOLIC PANEL - Abnormal; Notable for the following components:      Result Value   Creatinine, Ser 1.05 (*)    GFR calc non Af Amer 51 (*)    GFR calc Af Amer 59 (*)    All other components within normal limits  CBC WITH DIFFERENTIAL/PLATELET - Abnormal; Notable for the following components:   Hemoglobin 11.8 (*)    All other  components within normal limits  TROPONIN I    EKG EKG Interpretation  Date/Time:  Sunday October 18 2018 13:46:43 EDT Ventricular Rate:  68 PR Interval:    QRS Duration: 136 QT Interval:  422 QTC Calculation: 449 R Axis:   -3 Text Interpretation:  Sinus rhythm Right bundle branch block Baseline wander in lead(s) I II aVR aVL V1 No significant change since last tracing Confirmed by Gareth Morgan [phone]) on 10/18/2018 2:35:26 PM   Radiology Dg Chest 2 View  Result Date: 10/18/2018 CLINICAL DATA:  Shortness of breath. EXAM: CHEST - 2 VIEW COMPARISON:  July 03, 2017 FINDINGS: The heart size borderline. The hila and mediastinum are unremarkable. No pneumothorax. No nodules or masses. No focal infiltrates. IMPRESSION: No active cardiopulmonary disease. Electronically Signed   By: Dorise Bullion III M.D   On: 10/18/2018 14:15   Ct Head Wo Contrast  Result Date: 10/18/2018 CLINICAL DATA:  Headache. EXAM: CT HEAD WITHOUT CONTRAST TECHNIQUE: Contiguous axial images were obtained from the base of the skull through the vertex without intravenous contrast. COMPARISON:  CT scan of January 15, 2016. FINDINGS: Brain: No evidence of acute infarction, hemorrhage, hydrocephalus, extra-axial collection or mass lesion/mass effect. Vascular: No hyperdense vessel or unexpected calcification. Skull: Normal. Negative for fracture or focal lesion. Sinuses/Orbits: No acute finding. Other: None. IMPRESSION: Normal head CT. Electronically Signed   By: Marijo Conception, M.D.   On: 10/18/2018 14:21    Procedures Procedures (including critical care time)  Medications Ordered in ED Medications - No data to display   Initial Impression / Assessment and Plan / ED Course  I have reviewed the triage vital signs and the nursing notes.  Pertinent labs & imaging results that were available during my care of the patient were reviewed by me and considered in my medical decision  making (see chart for details).          Stefannie P Gambrell was evaluated in Emergency Department on 10/18/18 for the symptoms described in the history of present illness. He/she was evaluated in the context of the global COVID-19 pandemic, which necessitated consideration that the patient might be at risk for infection with the SARS-CoV-2 virus that causes COVID-19. Institutional protocols and algorithms that pertain to the evaluation of patients at risk for COVID-19 are in a state of rapid change based on information released by regulatory bodies including the CDC and federal and state organizations. These policies and algorithms were followed during the patient's care in the ED.  78 year old female with a past medical history of CAD, hypertension, hyperlipidemia presents to ED for 3-day history of shortness of breath and headache.  Both of these symptoms have been intermittent with no specific aggravating or alleviating factor.  She took 1 dose of ibuprofen when her headache began but has not been taking the medication since.  Denies leg swelling, chest pain, cough, fever, head injuries, vision changes or neck pain.  On my exam she is overall well-appearing.  Speaking complete sentences without difficulty.  No lower extremity edema, erythema or calf tenderness bilaterally.  She is not tachycardic, tachypneic or hypoxic.  Lungs are clear to auscultation bilaterally.  No deficits neurological exam noted.  EKG shows no changes from prior tracings.  Troponin, CBC, BMP unremarkable.  Chest x-ray is negative for acute abnormality.  CT of the head is negative.  Patient denies any current symptoms.  She has not been in contact with anyone who has tested positive for covid19, or been to any high risk areas for travel.  We will have her quarantine for 2 weeks and follow-up with her PCP.  Return to the ED for worsening symptoms.  Patient is hemodynamically stable, in NAD, and able to ambulate in the ED. Evaluation does not show pathology that would require  ongoing emergent intervention or inpatient treatment. I explained the diagnosis to the patient. Pain has been managed and has no complaints prior to discharge. Patient is comfortable with above plan and is stable for discharge at this time. All questions were answered prior to disposition. Strict return precautions for returning to the ED were discussed. Encouraged follow up with PCP.    Portions of this note were generated with L[employer]. Dictation errors may occur despite best attempts at proofreading.    Final Clinical Impressions(s) / ED Diagnoses   Final diagnoses:  SOB (shortness of breath)  Acute nonintractable headache, unspecified headache type  Educated About Covid-19 Virus Infection    ED Discharge Orders    None       Delia Heady, PA-C 10/18/18 1457    Gareth Morgan, MD 10/18/18 [phone]

## 2018-10-18 NOTE — ED Notes (Signed)
Patient transported to X-ray 

## 2018-10-21 DIAGNOSIS — I10 Essential (primary) hypertension: Secondary | ICD-10-CM | POA: Diagnosis not present

## 2018-10-21 DIAGNOSIS — E782 Mixed hyperlipidemia: Secondary | ICD-10-CM | POA: Diagnosis not present

## 2018-10-28 DIAGNOSIS — I10 Essential (primary) hypertension: Secondary | ICD-10-CM | POA: Diagnosis not present

## 2018-12-02 DIAGNOSIS — M10072 Idiopathic gout, left ankle and foot: Secondary | ICD-10-CM | POA: Diagnosis not present

## 2018-12-02 DIAGNOSIS — Z7189 Other specified counseling: Secondary | ICD-10-CM | POA: Diagnosis not present

## 2018-12-02 DIAGNOSIS — I10 Essential (primary) hypertension: Secondary | ICD-10-CM | POA: Diagnosis not present

## 2019-01-05 DIAGNOSIS — Z853 Personal history of malignant neoplasm of breast: Secondary | ICD-10-CM | POA: Diagnosis not present

## 2019-01-05 DIAGNOSIS — Z1231 Encounter for screening mammogram for malignant neoplasm of breast: Secondary | ICD-10-CM | POA: Diagnosis not present

## 2019-01-22 ENCOUNTER — Other Ambulatory Visit: Payer: Self-pay

## 2019-01-22 ENCOUNTER — Emergency Department (HOSPITAL_BASED_OUTPATIENT_CLINIC_OR_DEPARTMENT_OTHER)
Admission: EM | Admit: 2019-01-22 | Discharge: 2019-01-22 | Disposition: A | Discharge status: Home / Self Care | Payer: Medicare Other | Attending: Emergency Medicine | Admitting: Emergency Medicine

## 2019-01-22 ENCOUNTER — Encounter (HOSPITAL_BASED_OUTPATIENT_CLINIC_OR_DEPARTMENT_OTHER): Payer: Self-pay | Admitting: Emergency Medicine

## 2019-01-22 DIAGNOSIS — Z951 Presence of aortocoronary bypass graft: Secondary | ICD-10-CM | POA: Insufficient documentation

## 2019-01-22 DIAGNOSIS — Z853 Personal history of malignant neoplasm of breast: Secondary | ICD-10-CM | POA: Insufficient documentation

## 2019-01-22 DIAGNOSIS — H81399 Other peripheral vertigo, unspecified ear: Secondary | ICD-10-CM | POA: Insufficient documentation

## 2019-01-22 DIAGNOSIS — E039 Hypothyroidism, unspecified: Secondary | ICD-10-CM | POA: Insufficient documentation

## 2019-01-22 DIAGNOSIS — Z87891 Personal history of nicotine dependence: Secondary | ICD-10-CM | POA: Insufficient documentation

## 2019-01-22 DIAGNOSIS — R42 Dizziness and giddiness: Secondary | ICD-10-CM | POA: Diagnosis present

## 2019-01-22 DIAGNOSIS — I10 Essential (primary) hypertension: Secondary | ICD-10-CM | POA: Insufficient documentation

## 2019-01-22 DIAGNOSIS — R001 Bradycardia, unspecified: Secondary | ICD-10-CM | POA: Diagnosis not present

## 2019-01-22 LAB — URINALYSIS, MICROSCOPIC (REFLEX): RBC / HPF: NONE SEEN RBC/hpf (ref 0–5)

## 2019-01-22 LAB — URINALYSIS, ROUTINE W REFLEX MICROSCOPIC
Bilirubin Urine: NEGATIVE
Glucose, UA: NEGATIVE mg/dL
Hgb urine dipstick: NEGATIVE
Ketones, ur: NEGATIVE mg/dL
Leukocytes,Ua: NEGATIVE
Nitrite: NEGATIVE
Protein, ur: 30 mg/dL — AB
Specific Gravity, Urine: 1.02 (ref 1.005–1.030)
pH: 6 (ref 5.0–8.0)

## 2019-01-22 MED ORDER — MECLIZINE HCL 25 MG PO TABS
25.0000 mg | ORAL_TABLET | Freq: Once | ORAL | Status: AC
  Administered 2019-01-22: 25 mg via ORAL
  Filled 2019-01-22: qty 1

## 2019-01-22 MED ORDER — MECLIZINE HCL 25 MG PO TABS: 25.0000 mg | ORAL_TABLET | Freq: Three times a day (TID) | ORAL | 0 refills | Status: DC | PRN

## 2019-01-22 MED ORDER — ONDANSETRON 4 MG PO TBDP
4.0000 mg | ORAL_TABLET | Freq: Once | ORAL | Status: AC
  Administered 2019-01-22: 4 mg via ORAL
  Filled 2019-01-22: qty 1

## 2019-01-22 NOTE — Discharge Instructions (Addendum)
You were evaluated in the Emergency Department and after careful evaluation, we did not find any emergent condition requiring admission or further testing in the hospital.  Your symptoms today seem to be due to vertigo.  You can use the medication as needed if symptoms return at home.  We also recommend trying the attached maneuvers to help with the symptoms.  Please return to the Emergency Department if you experience any worsening of your condition.  We encourage you to follow up with a primary care provider.  Thank you for allowing [place] to be a part of your care.

## 2019-01-22 NOTE — ED Notes (Signed)
EDP [facility] notified of patients symptoms

## 2019-01-22 NOTE — ED Notes (Signed)
ED Provider at bedside. 

## 2019-01-22 NOTE — ED Provider Notes (Signed)
[facility] Emergency Department Provider Note MRN:  [id number]  Arrival date & time: 01/22/19     Chief Complaint   Dizziness   History of Present Illness   [name] is a 78 y.o. year-old female with a history of CAD status post CABG, lupus presenting to the ED with chief complaint of dizziness.  Sudden onset dizziness this morning at 3 AM.  Patient explains that she had gotten out of bed to use the restroom, was getting back into bed, and during this time of transitioning to a laying position experienced sudden onset dizziness described as a room spinning sensation.  Associated with nausea.  Patient's dizziness has been intermittent, sometimes when she stays very still without moving her head it goes away, but is consistently triggered by head movement.  She denies neck pain, no headache, no chest pain, shortness of breath, abdominal pain, no numbness or weakness to the arms or legs.  Symptoms are moderate, no other exacerbating or alleviating factors.  Review of Systems  A complete 10 system review of systems was obtained and all systems are negative except as noted in the HPI and PMH.   Patient's Health History    Past Medical History:  Diagnosis Date  . Breast cancer [facility])    s/p mastectomy  . CAD (coronary artery disease)    s/p CABG  . DDD (degenerative disc disease), lumbar   . Discoid lupus   . Dyspnea   . GERD (gastroesophageal reflux disease)   . Gout   . HLD (hyperlipidemia)   . HTN (hypertension)   . Hypothyroidism   . Low back pain   . Obesity   . OSA (obstructive sleep apnea)    did not tolerate CPAP  . Osteoporosis   . PVD (peripheral vascular disease) ([place])   . Renal disorder     Past Surgical History:  Procedure Laterality Date  . ANKLE SURGERY  7/05   left  . CORONARY ANGIOPLASTY WITH STENT PLACEMENT  2003   7 total stents placed  . CORONARY ARTERY BYPASS GRAFT  1997  . ELBOW SURGERY     right  . hysterectomy -  unknown type    . MASTECTOMY  1985   right  . OOPHORECTOMY    . RENAL ARTERY STENT      Family History  Problem Relation Age of Onset  . Coronary artery disease Other   . Diabetes Other   . Hypertension Other   . Aneurysm Other   . Stroke Other     Social History   Socioeconomic History  . Marital status: Widowed    Spouse name: Not on file  . Number of children: Not on file  . Years of education: Not on file  . Highest education level: Not on file  Occupational History  . Occupation: retired  Scientific laboratory technician  . Financial resource strain: Not on file  . Food insecurity    Worry: Not on file    Inability: Not on file  . Transportation needs    Medical: Not on file    Non-medical: Not on file  Tobacco Use  . Smoking status: Former Smoker    Quit date: 07/23/1995    Years since quitting: 23.5  . Smokeless tobacco: Never Used  Substance and Sexual Activity  . Alcohol use: No  . Drug use: No  . Sexual activity: Not on file  Lifestyle  . Physical activity    Days per week: Not on  file    Minutes per session: Not on file  . Stress: Not on file  Relationships  . Social Herbalist on phone: Not on file    Gets together: Not on file    Attends religious service: Not on file    Active member of club or organization: Not on file    Attends meetings of clubs or organizations: Not on file    Relationship status: Not on file  . Intimate partner violence    Fear of current or ex partner: Not on file    Emotionally abused: Not on file    Physically abused: Not on file    Forced sexual activity: Not on file  Other Topics Concern  . Not on file  Social History Narrative  . Not on file     Physical Exam  Vital Signs and Nursing Notes reviewed Vitals:   01/22/19 0648  BP: (!) 147/57  Pulse: (!) 54  Resp: 18  Temp: (!) 97.5 F (36.4 C)  SpO2: 98%    CONSTITUTIONAL: Well-appearing, NAD NEURO:  Alert and oriented x 3, normal and symmetric strength and  sensation, normal coordination, no neglect, no speech difficulties, no aphasia EYES:  eyes equal and reactive, normal extraocular movements, no nystagmus ENT/NECK:  no LAD, no JVD CARDIO: Bradycardic rate, well-perfused, normal S1 and S2 PULM:  CTAB no wheezing or rhonchi GI/GU:  normal bowel sounds, non-distended, non-tender MSK/SPINE:  No gross deformities, no edema SKIN:  no rash, atraumatic PSYCH:  Appropriate speech and behavior  Diagnostic and Interventional Summary    EKG Interpretation  Date/Time:  Friday January 22 2019 06:50:47 EDT Ventricular Rate:  51 PR Interval:    QRS Duration: 128 QT Interval:  501 QTC Calculation: 462 R Axis:   8 Text Interpretation:  Sinus rhythm Nonspecific intraventricular conduction delay Borderline T abnormalities, anterior leads Confirmed by Gerlene Fee [phone]) on 01/22/2019 7:06:33 AM      Labs Reviewed  URINALYSIS, ROUTINE W REFLEX MICROSCOPIC - Abnormal; Notable for the following components:      Result Value   Protein, ur 30 (*)    All other components within normal limits  URINALYSIS, MICROSCOPIC (REFLEX) - Abnormal; Notable for the following components:   Bacteria, UA FEW (*)    All other components within normal limits  CBC  COMPREHENSIVE METABOLIC PANEL    No orders to display    Medications  ondansetron (ZOFRAN-ODT) disintegrating tablet 4 mg (4 mg Oral Given 01/22/19 0714)  meclizine (ANTIVERT) tablet 25 mg (25 mg Oral Given 01/22/19 0715)     Procedures Critical Care  ED Course and Medical Decision Making  I have reviewed the triage vital signs and the nursing notes.  Pertinent labs & imaging results that were available during my care of the patient were reviewed by me and considered in my medical decision making (see below for details).  History and exam most consistent with peripheral vertigo in this 78 year old female.  Patient does have risk factors with regard to age and cardiovascular disease, but she currently  exhibits no neurological deficits, the vertigo classically started suddenly and is triggered by head movement.  No nystagmus.  Will attempt symptom control and reassess.  Currently without indication for CNS imaging.  Patient is feeling much better after medications listed above, ambulating normally without assistance.  Appropriate for discharge with prescription for meclizine, close PCP follow-up, strict return precautions for neurological deficits.  After the discussed management above, the patient was determined  to be safe for discharge.  The patient was in agreement with this plan and all questions regarding their care were answered.  ED return precautions were discussed and the patient will return to the ED with any significant worsening of condition.  Barth Kirks. Sedonia Small, [place] [email or web address]  Final Clinical Impressions(s) / ED Diagnoses     ICD-10-CM   1. Peripheral vertigo, unspecified laterality  H81.399     ED Discharge Orders         Ordered    meclizine (ANTIVERT) 25 MG tablet  3 times daily PRN     01/22/19 6015             Maudie Flakes, MD 01/22/19 0840

## 2019-01-22 NOTE — ED Notes (Signed)
Unable to obtain bloodwork or IV access after multiple attempts.  MD made aware, hold on bloodwork for now.  Pt reports improvement in dizziness following medications.  Ambulated to bathroom with steady gait to provide urine specimen.

## 2019-01-22 NOTE — ED Notes (Signed)
Awoke with dizziness this morning, no other complaints.

## 2019-01-22 NOTE — ED Triage Notes (Signed)
Pt states that approx 0300 she started experiencing dizziness. States she was already awake. No history of the same. States that if she is still that the dizzy resolves. Makes her nausea. Denies numbness, tingling, or weakness in extremites

## 2019-02-10 DIAGNOSIS — R7303 Prediabetes: Secondary | ICD-10-CM | POA: Diagnosis not present

## 2019-02-10 DIAGNOSIS — I10 Essential (primary) hypertension: Secondary | ICD-10-CM | POA: Diagnosis not present

## 2019-02-10 DIAGNOSIS — R42 Dizziness and giddiness: Secondary | ICD-10-CM | POA: Diagnosis not present

## 2019-02-10 DIAGNOSIS — M13852 Other specified arthritis, left hip: Secondary | ICD-10-CM | POA: Diagnosis not present

## 2019-02-10 DIAGNOSIS — E039 Hypothyroidism, unspecified: Secondary | ICD-10-CM | POA: Diagnosis not present

## 2019-02-10 DIAGNOSIS — M1000 Idiopathic gout, unspecified site: Secondary | ICD-10-CM | POA: Diagnosis not present

## 2019-02-18 ENCOUNTER — Ambulatory Visit
Admission: RE | Admit: 2019-02-18 | Discharge: 2019-02-18 | Disposition: A | Discharge status: Home / Self Care | Payer: Medicare Other | Source: Ambulatory Visit | Attending: Nurse Practitioner | Admitting: Nurse Practitioner

## 2019-02-18 ENCOUNTER — Other Ambulatory Visit: Payer: Self-pay | Admitting: Nurse Practitioner

## 2019-02-18 DIAGNOSIS — R2241 Localized swelling, mass and lump, right lower limb: Secondary | ICD-10-CM

## 2019-02-18 DIAGNOSIS — M19071 Primary osteoarthritis, right ankle and foot: Secondary | ICD-10-CM | POA: Diagnosis not present

## 2019-03-03 ENCOUNTER — Other Ambulatory Visit: Payer: Self-pay

## 2019-03-03 DIAGNOSIS — I10 Essential (primary) hypertension: Secondary | ICD-10-CM | POA: Diagnosis not present

## 2019-03-03 DIAGNOSIS — M13852 Other specified arthritis, left hip: Secondary | ICD-10-CM | POA: Diagnosis not present

## 2019-03-03 DIAGNOSIS — E782 Mixed hyperlipidemia: Secondary | ICD-10-CM | POA: Diagnosis not present

## 2019-03-03 NOTE — Patient Outreach (Signed)
[facility] [facility]) Care Management  03/03/2019  BILL MCVEY 09-26-1940 [id number]   Medication Adherence call to Mrs. [place] Compliant Voice message left with a call back number. Mrs. Blassingame is past due on Atorvastatin 40 mg and Losartan 25 mg under [place].   [place] Management Direct Dial [phone]  Fax [phone] [email or web address]

## 2019-03-23 ENCOUNTER — Other Ambulatory Visit: Payer: Self-pay

## 2019-03-23 NOTE — Patient Outreach (Signed)
[place] [facility]) Care Management  03/23/2019  [name] 1941/01/28 [place]:[id number]   Medication Adherence call to Mrs. [name] Telephone call to Patient regarding Medication Adherence unable to reach patient Mrs. Resendes is showing past due on Atorvastatin 40 mg and Losartan 25 mg under [place].   [place] Management Direct Dial [phone]  Fax [phone] [email or web address]

## 2019-03-24 DIAGNOSIS — Z0000 Encounter for general adult medical examination without abnormal findings: Secondary | ICD-10-CM | POA: Diagnosis not present

## 2019-04-21 DIAGNOSIS — H26491 Other secondary cataract, right eye: Secondary | ICD-10-CM | POA: Diagnosis not present

## 2019-04-21 DIAGNOSIS — H43391 Other vitreous opacities, right eye: Secondary | ICD-10-CM | POA: Diagnosis not present

## 2019-04-22 ENCOUNTER — Other Ambulatory Visit: Payer: Self-pay

## 2019-04-22 NOTE — Patient Outreach (Signed)
[facility]) Care Management  04/22/2019  [name] 09-23-1940 [place]:[id number]   Medication Adherence call to [name] Compliant Voice message left with a call back number. [name] is showing past due on Atorvastatin 40 mg and Losartan 25 mg under [name].   [place] Management Direct Dial [phone]  Fax [phone] [email or web address]

## 2019-04-24 DIAGNOSIS — R609 Edema, unspecified: Secondary | ICD-10-CM | POA: Diagnosis not present

## 2019-04-24 DIAGNOSIS — E039 Hypothyroidism, unspecified: Secondary | ICD-10-CM | POA: Diagnosis not present

## 2019-04-24 DIAGNOSIS — I10 Essential (primary) hypertension: Secondary | ICD-10-CM | POA: Diagnosis not present

## 2019-04-30 DIAGNOSIS — I10 Essential (primary) hypertension: Secondary | ICD-10-CM | POA: Diagnosis not present

## 2019-04-30 DIAGNOSIS — R2241 Localized swelling, mass and lump, right lower limb: Secondary | ICD-10-CM | POA: Diagnosis not present

## 2019-04-30 DIAGNOSIS — E1169 Type 2 diabetes mellitus with other specified complication: Secondary | ICD-10-CM | POA: Diagnosis not present

## 2019-04-30 DIAGNOSIS — E782 Mixed hyperlipidemia: Secondary | ICD-10-CM | POA: Diagnosis not present

## 2019-04-30 DIAGNOSIS — R600 Localized edema: Secondary | ICD-10-CM | POA: Diagnosis not present

## 2019-04-30 DIAGNOSIS — E038 Other specified hypothyroidism: Secondary | ICD-10-CM | POA: Diagnosis not present

## 2019-04-30 DIAGNOSIS — M10072 Idiopathic gout, left ankle and foot: Secondary | ICD-10-CM | POA: Diagnosis not present

## 2019-05-07 ENCOUNTER — Ambulatory Visit
Admission: RE | Admit: 2019-05-07 | Discharge: 2019-05-07 | Disposition: A | Discharge status: Home / Self Care | Payer: Medicare Other | Source: Ambulatory Visit | Attending: Family Medicine | Admitting: Family Medicine

## 2019-05-07 ENCOUNTER — Other Ambulatory Visit: Payer: Self-pay | Admitting: Family Medicine

## 2019-05-07 DIAGNOSIS — R600 Localized edema: Secondary | ICD-10-CM | POA: Diagnosis not present

## 2019-05-07 DIAGNOSIS — R52 Pain, unspecified: Secondary | ICD-10-CM

## 2019-05-07 DIAGNOSIS — M19071 Primary osteoarthritis, right ankle and foot: Secondary | ICD-10-CM | POA: Diagnosis not present

## 2019-05-21 DIAGNOSIS — I10 Essential (primary) hypertension: Secondary | ICD-10-CM | POA: Diagnosis not present

## 2019-06-11 DIAGNOSIS — I10 Essential (primary) hypertension: Secondary | ICD-10-CM | POA: Diagnosis not present

## 2019-07-01 DIAGNOSIS — M1000 Idiopathic gout, unspecified site: Secondary | ICD-10-CM | POA: Diagnosis not present

## 2019-07-01 DIAGNOSIS — I10 Essential (primary) hypertension: Secondary | ICD-10-CM | POA: Diagnosis not present

## 2019-07-01 DIAGNOSIS — E1169 Type 2 diabetes mellitus with other specified complication: Secondary | ICD-10-CM | POA: Diagnosis not present

## 2019-07-01 DIAGNOSIS — E782 Mixed hyperlipidemia: Secondary | ICD-10-CM | POA: Diagnosis not present

## 2019-07-27 ENCOUNTER — Other Ambulatory Visit: Payer: Self-pay | Admitting: Cardiology

## 2019-08-12 DIAGNOSIS — I10 Essential (primary) hypertension: Secondary | ICD-10-CM | POA: Diagnosis not present

## 2019-08-12 DIAGNOSIS — E785 Hyperlipidemia, unspecified: Secondary | ICD-10-CM | POA: Diagnosis not present

## 2019-09-19 ENCOUNTER — Ambulatory Visit: Payer: Medicare Other | Attending: Internal Medicine

## 2019-09-19 DIAGNOSIS — Z23 Encounter for immunization: Secondary | ICD-10-CM

## 2019-09-19 NOTE — Progress Notes (Signed)
   Covid-19 Vaccination Clinic  Name:  RAYNITA NEMETH    MRN: [id number] DOB: January 21, 1941  09/19/2019  Ms. Tidd was observed post Covid-19 immunization for 15 minutes without incidence. She was provided with Vaccine Information Sheet and instruction to access the V-Safe system.   Ms. Eskildsen was instructed to call 911 with any severe reactions post vaccine: Marland Kitchen Difficulty breathing  . Swelling of your face and throat  . A fast heartbeat  . A bad rash all over your body  . Dizziness and weakness    Immunizations Administered    Name Date Dose VIS Date Route   Pfizer COVID-19 Vaccine 09/19/2019  9:38 AM 0.3 mL 07/02/2019 Intramuscular   Manufacturer: Luis M. Cintron   Lot: [id number]   [place]: [id number]

## 2019-09-30 ENCOUNTER — Other Ambulatory Visit: Payer: Self-pay | Admitting: Cardiology

## 2019-09-30 DIAGNOSIS — E1169 Type 2 diabetes mellitus with other specified complication: Secondary | ICD-10-CM | POA: Diagnosis not present

## 2019-09-30 DIAGNOSIS — I119 Hypertensive heart disease without heart failure: Secondary | ICD-10-CM | POA: Diagnosis not present

## 2019-09-30 DIAGNOSIS — R21 Rash and other nonspecific skin eruption: Secondary | ICD-10-CM | POA: Diagnosis not present

## 2019-10-19 ENCOUNTER — Ambulatory Visit: Payer: Medicare Other | Attending: Internal Medicine

## 2019-10-19 DIAGNOSIS — Z23 Encounter for immunization: Secondary | ICD-10-CM

## 2019-10-19 NOTE — Progress Notes (Signed)
   Covid-19 Vaccination Clinic  Name:  [name]    MRN: [id number] DOB: Nov 27, 1940  10/19/2019  [name] was observed post Covid-19 immunization for 15 minutes without incident. She was provided with Vaccine Information Sheet and instruction to access the V-Safe system.   [name] was instructed to call [phone] with any severe reactions post vaccine: Marland Kitchen Difficulty breathing  . Swelling of face and throat  . A fast heartbeat  . A bad rash all over body  . Dizziness and weakness   Immunizations Administered    Name Date Dose VIS Date Route   Pfizer COVID-19 Vaccine 10/19/2019  8:40 AM 0.3 mL 07/02/2019 Intramuscular   Manufacturer: [employer]   Lot: [id number]   [place]: [id number]

## 2019-11-01 DIAGNOSIS — E038 Other specified hypothyroidism: Secondary | ICD-10-CM | POA: Diagnosis not present

## 2019-11-01 DIAGNOSIS — I509 Heart failure, unspecified: Secondary | ICD-10-CM | POA: Diagnosis not present

## 2019-11-01 DIAGNOSIS — E782 Mixed hyperlipidemia: Secondary | ICD-10-CM | POA: Diagnosis not present

## 2019-11-01 DIAGNOSIS — I119 Hypertensive heart disease without heart failure: Secondary | ICD-10-CM | POA: Diagnosis not present

## 2019-11-01 DIAGNOSIS — E1169 Type 2 diabetes mellitus with other specified complication: Secondary | ICD-10-CM | POA: Diagnosis not present

## 2019-11-01 DIAGNOSIS — I498 Other specified cardiac arrhythmias: Secondary | ICD-10-CM | POA: Diagnosis not present

## 2019-11-16 ENCOUNTER — Other Ambulatory Visit: Payer: Self-pay | Admitting: *Deleted

## 2019-11-16 ENCOUNTER — Telehealth: Payer: Self-pay | Admitting: *Deleted

## 2019-11-16 DIAGNOSIS — I4940 Unspecified premature depolarization: Secondary | ICD-10-CM

## 2019-11-16 DIAGNOSIS — I4510 Unspecified right bundle-branch block: Secondary | ICD-10-CM

## 2019-11-16 NOTE — Telephone Encounter (Signed)
Please call [facility] to schedule appointment for cardiac event monitor ordered by Dr. Criss Rosales.  (Have appointment available in monitor department ,[facility] 4/29 at 1:00PM)

## 2019-11-17 NOTE — Telephone Encounter (Signed)
Monitor appointment changed to Thursday, 11/18/2019 at 4:00PM per patients request.

## 2019-11-17 NOTE — Telephone Encounter (Signed)
Patient is requesting to reschedule cardiac event monitor appointment, currently scheduled for 11/18/19 at 3:30 PM. She states she would prefer to come in to the office at 4:00 PM on 11/18/19. Please assist.

## 2019-11-18 ENCOUNTER — Other Ambulatory Visit: Payer: Self-pay

## 2019-11-18 ENCOUNTER — Other Ambulatory Visit: Payer: Self-pay | Admitting: Family Medicine

## 2019-11-18 ENCOUNTER — Ambulatory Visit (INDEPENDENT_AMBULATORY_CARE_PROVIDER_SITE_OTHER): Payer: Medicare Other

## 2019-11-18 DIAGNOSIS — I4940 Unspecified premature depolarization: Secondary | ICD-10-CM | POA: Diagnosis not present

## 2019-11-18 DIAGNOSIS — R002 Palpitations: Secondary | ICD-10-CM

## 2019-11-18 DIAGNOSIS — I4510 Unspecified right bundle-branch block: Secondary | ICD-10-CM

## 2020-01-07 DIAGNOSIS — Z1231 Encounter for screening mammogram for malignant neoplasm of breast: Secondary | ICD-10-CM | POA: Diagnosis not present

## 2020-02-01 DIAGNOSIS — E1169 Type 2 diabetes mellitus with other specified complication: Secondary | ICD-10-CM | POA: Diagnosis not present

## 2020-02-01 DIAGNOSIS — I119 Hypertensive heart disease without heart failure: Secondary | ICD-10-CM | POA: Diagnosis not present

## 2020-03-23 ENCOUNTER — Other Ambulatory Visit: Payer: Self-pay | Admitting: Cardiology

## 2020-03-23 DIAGNOSIS — J302 Other seasonal allergic rhinitis: Secondary | ICD-10-CM | POA: Diagnosis not present

## 2020-03-23 DIAGNOSIS — J069 Acute upper respiratory infection, unspecified: Secondary | ICD-10-CM | POA: Diagnosis not present

## 2020-03-28 DIAGNOSIS — R05 Cough: Secondary | ICD-10-CM | POA: Diagnosis not present

## 2020-03-28 DIAGNOSIS — Z03818 Encounter for observation for suspected exposure to other biological agents ruled out: Secondary | ICD-10-CM | POA: Diagnosis not present

## 2020-05-04 DIAGNOSIS — I10 Essential (primary) hypertension: Secondary | ICD-10-CM | POA: Diagnosis not present

## 2020-05-04 DIAGNOSIS — E119 Type 2 diabetes mellitus without complications: Secondary | ICD-10-CM | POA: Diagnosis not present

## 2020-05-04 DIAGNOSIS — E782 Mixed hyperlipidemia: Secondary | ICD-10-CM | POA: Diagnosis not present

## 2020-05-04 DIAGNOSIS — E1169 Type 2 diabetes mellitus with other specified complication: Secondary | ICD-10-CM | POA: Diagnosis not present

## 2020-05-04 DIAGNOSIS — E038 Other specified hypothyroidism: Secondary | ICD-10-CM | POA: Diagnosis not present

## 2020-05-09 DIAGNOSIS — Z23 Encounter for immunization: Secondary | ICD-10-CM | POA: Diagnosis not present

## 2020-06-06 DIAGNOSIS — E039 Hypothyroidism, unspecified: Secondary | ICD-10-CM | POA: Diagnosis not present

## 2020-06-06 DIAGNOSIS — I10 Essential (primary) hypertension: Secondary | ICD-10-CM | POA: Diagnosis not present

## 2020-06-06 DIAGNOSIS — R7309 Other abnormal glucose: Secondary | ICD-10-CM | POA: Diagnosis not present

## 2020-06-20 DIAGNOSIS — I10 Essential (primary) hypertension: Secondary | ICD-10-CM | POA: Diagnosis not present

## 2020-08-01 DIAGNOSIS — R7309 Other abnormal glucose: Secondary | ICD-10-CM | POA: Diagnosis not present

## 2020-08-01 DIAGNOSIS — I10 Essential (primary) hypertension: Secondary | ICD-10-CM | POA: Diagnosis not present

## 2020-08-01 DIAGNOSIS — E785 Hyperlipidemia, unspecified: Secondary | ICD-10-CM | POA: Diagnosis not present

## 2020-08-01 DIAGNOSIS — R600 Localized edema: Secondary | ICD-10-CM | POA: Diagnosis not present

## 2020-09-19 DIAGNOSIS — E782 Mixed hyperlipidemia: Secondary | ICD-10-CM | POA: Diagnosis not present

## 2020-09-19 DIAGNOSIS — E119 Type 2 diabetes mellitus without complications: Secondary | ICD-10-CM | POA: Diagnosis not present

## 2020-09-19 DIAGNOSIS — I10 Essential (primary) hypertension: Secondary | ICD-10-CM | POA: Diagnosis not present

## 2020-09-19 DIAGNOSIS — E039 Hypothyroidism, unspecified: Secondary | ICD-10-CM | POA: Diagnosis not present

## 2020-09-28 ENCOUNTER — Other Ambulatory Visit: Payer: Self-pay | Admitting: Cardiology

## 2020-09-30 ENCOUNTER — Other Ambulatory Visit: Payer: Self-pay | Admitting: Cardiology

## 2020-10-03 ENCOUNTER — Other Ambulatory Visit: Payer: Self-pay | Admitting: Cardiology

## 2020-10-09 ENCOUNTER — Encounter: Payer: Self-pay | Admitting: Cardiology

## 2020-10-09 ENCOUNTER — Ambulatory Visit: Payer: Medicare Other | Admitting: Cardiology

## 2020-10-09 ENCOUNTER — Other Ambulatory Visit: Payer: Self-pay

## 2020-10-09 VITALS — BP 154/71 | HR 59 | Temp 98.0°F | Resp 17 | Ht 65.0 in | Wt 192.8 lb

## 2020-10-09 DIAGNOSIS — R0609 Other forms of dyspnea: Secondary | ICD-10-CM | POA: Diagnosis not present

## 2020-10-09 DIAGNOSIS — I4510 Unspecified right bundle-branch block: Secondary | ICD-10-CM

## 2020-10-09 DIAGNOSIS — R0600 Dyspnea, unspecified: Secondary | ICD-10-CM

## 2020-10-09 DIAGNOSIS — I358 Other nonrheumatic aortic valve disorders: Secondary | ICD-10-CM

## 2020-10-09 DIAGNOSIS — I739 Peripheral vascular disease, unspecified: Secondary | ICD-10-CM | POA: Diagnosis not present

## 2020-10-09 DIAGNOSIS — I25810 Atherosclerosis of coronary artery bypass graft(s) without angina pectoris: Secondary | ICD-10-CM

## 2020-10-09 MED ORDER — CARVEDILOL 12.5 MG PO TABS: 12.5000 mg | ORAL_TABLET | Freq: Two times a day (BID) | ORAL | 3 refills | Status: DC

## 2020-10-09 NOTE — Progress Notes (Signed)
Primary Physician/Referring:  Lucianne Lei, MD  Patient ID: [name], female    DOB: 1940-08-16, 80 y.o.   MRN: [id number]  No chief complaint on file.  HPI:    [name]  is a 80 y.o. AA female with history of hyperlipidemia, renovascular hypertension S/P renal stent in 1997, CAD S/P CABG in 1997, PAD with bilateral PTFE covered iliac stents in 2009, mild abdominal aortic ectasia noted on CT scan of the abdomen, PAD, sleep apnea but has not been able to tolerate CPAP.  Patient denies history of CKD or diabetes.  Patient was previously seen in our office by Dr. Einar Gip, however she was unfortunately lost to follow-up.  She now presents for reevaluation and management of coronary artery disease, peripheral arterial disease, and hypertension hyperlipidemia.  Patient's primary concern today is that she requires refills of carvedilol.  However upon further questioning she admits to exertional dyspnea and occasional episodes of chest pain after she drinks her coffee in the morning.  She reports this chest discomfort relieves in less than 5 minutes after she drinks ice water.  She continues to follow closely with Dr. Chalmers Cater for management of thyroid dysfunction.  Denies palpitations, syncope, near syncope, leg swelling.  Denies orthopnea, PND.  Past Medical History:  Diagnosis Date  . Breast cancer [facility])    s/p mastectomy  . CAD (coronary artery disease)    s/p CABG  . DDD (degenerative disc disease), lumbar   . Discoid lupus   . Dyspnea   . GERD (gastroesophageal reflux disease)   . Gout   . HLD (hyperlipidemia)   . HTN (hypertension)   . Hypothyroidism   . Low back pain   . Obesity   . OSA (obstructive sleep apnea)    did not tolerate CPAP  . Osteoporosis   . PVD (peripheral vascular disease) ([place])   . Renal disorder    Past Surgical History:  Procedure Laterality Date  . ANKLE SURGERY  7/05   left  . CORONARY ANGIOPLASTY WITH STENT PLACEMENT  2003   7 total stents placed   . CORONARY ARTERY BYPASS GRAFT  1997  . ELBOW SURGERY     right  . hysterectomy - unknown type    . MASTECTOMY  1985   right  . OOPHORECTOMY    . RENAL ARTERY STENT     Family History  Problem Relation Age of Onset  . Coronary artery disease Other   . Diabetes Other   . Hypertension Other   . Aneurysm Other   . Stroke Other   . Aneurysm Mother   . Alzheimer's disease Father   . Hypertension Sister   . Hyperlipidemia Sister   . Suicidality Brother   . Hypertension Sister   . Hyperlipidemia Sister   . Hypertension Sister   . Hyperlipidemia Sister   . Drug abuse Son     Social History   Tobacco Use  . Smoking status: Former Smoker    Types: Cigarettes    Quit date: 07/23/1995    Years since quitting: 25.2  . Smokeless tobacco: Never Used  Substance Use Topics  . Alcohol use: No   Marital Status: Widowed  ROS  Review of Systems  Constitutional: Negative for malaise/fatigue and weight gain.  Cardiovascular: Positive for chest pain and dyspnea on exertion. Negative for claudication, leg swelling, near-syncope, orthopnea, palpitations, paroxysmal nocturnal dyspnea and syncope.  Hematologic/Lymphatic: Does not bruise/bleed easily.  Gastrointestinal: Negative for melena.  Neurological: Negative for dizziness  and weakness.   Objective  Blood pressure (!) 154/71, pulse (!) 59, temperature 98 F (36.7 C), temperature source Temporal, resp. rate 17, height '5\' 5"'  (1.651 m), weight 192 lb 12.8 oz (87.5 kg), SpO2 98 %.  Vitals with BMI 81/21/2022 10/09/2020 01/22/2019  Height - '5\' 5"'  -  Weight - 192 lbs 13 oz -  BMI - 81 -  Systolic 818 563 149  Diastolic 71 66 78  Pulse 59 63 56     Physical Exam Vitals reviewed.  HENT:     Head: Normocephalic and atraumatic.  Cardiovascular:     Rate and Rhythm: Normal rate and regular rhythm.     Pulses: Intact distal pulses.          Carotid pulses are 2+ on the right side and 2+ on the left side.      Radial pulses are 2+ on the  right side and 2+ on the left side.       Femoral pulses are 1+ on the right side with bruit and 2+ on the left side with bruit.      Popliteal pulses are 0 on the right side and 0 on the left side.       Dorsalis pedis pulses are 0 on the right side and 0 on the left side.       Posterior tibial pulses are 1+ on the right side and 1+ on the left side.     Heart sounds: S1 normal and S2 normal. No murmur heard. No gallop.   Pulmonary:     Effort: Pulmonary effort is normal. No respiratory distress.     Breath sounds: No wheezing, rhonchi or rales.  Abdominal:     General: Bowel sounds are normal. There is no distension.     Palpations: Abdomen is soft.  Musculoskeletal:     Right lower leg: No edema.     Left lower leg: No edema.  Skin:    General: Skin is warm and dry.  Neurological:     General: No focal deficit present.     Mental Status: She is alert and oriented to person, place, and time.    Laboratory examination:   No results for input(s): NA, K, CL, CO2, GLUCOSE, BUN, CREATININE, CALCIUM, GFRNONAA, GFRAA in the last 8760 hours. CrCl cannot be calculated (Patient's most recent lab result is older than the maximum 21 days allowed.).  CMP Latest Ref Rng & Units 10/18/2018 03/25/2016 02/21/2016  Glucose 70 - 99 mg/dL 98 91 88  BUN 8 - 23 mg/dL '13 17 19  ' Creatinine 0.44 - 1.00 mg/dL 1.05(H) 0.93 1.11(H)  Sodium 135 - 145 mmol/L 139 138 139  Potassium 3.5 - 5.1 mmol/L 4.4 3.9 4.1  Chloride 98 - 111 mmol/L 110 112(H) 111  CO2 22 - 32 mmol/L 24 20(L) 22  Calcium 8.9 - 10.3 mg/dL 9.8 8.9 9.6  Total Protein 6.5 - 8.1 g/dL - - 7.8  Total Bilirubin 0.3 - 1.2 mg/dL - - 1.1  Alkaline Phos 38 - 126 U/L - - 115  AST 15 - 41 U/L - - 39  ALT 14 - 54 U/L - - 31   CBC Latest Ref Rng & Units 10/18/2018 02/21/2016 01/15/2016  WBC 4.0 - 10.5 K/uL 6.0 5.5 7.1  Hemoglobin 12.0 - 15.0 g/dL 11.8(L) 12.7 12.8  Hematocrit 36.0 - 46.0 % 38.5 39.2 39.5  Platelets 150 - 400 K/uL 198 202 202    Lipid  Panel No results for  input(s): CHOL, TRIG, LDLCALC, VLDL, HDL, CHOLHDL, LDLDIRECT in the last 8760 hours. Lipid Panel     Component Value Date/Time   CHOL 107 12/10/2010 0910   TRIG 93.0 12/10/2010 0910   HDL 37.40 (L) 12/10/2010 0910   CHOLHDL 3 12/10/2010 0910   VLDL 18.6 12/10/2010 0910   LDLCALC 51 12/10/2010 0910     HEMOGLOBIN A1C No results found for: HGBA1C, MPG TSH No results for input(s): TSH in the last 8760 hours.  External labs:  09/19/2020: Total cholesterol 112, HDL 33, triglycerides 131, LDL 58 Glucose 95, BUN 18, creatinine 1.13, GFR 46, sodium 141, potassium 3.7, alk phos 111, AST 42, ALT 29 A1c 6.0% TSH 0.02 free T4 1.3 Hemoglobin 12.6, hematocrit 38.5, MCV 83.5, platelet 230  Medications and allergies   Allergies  Allergen Reactions  . Amoxicillin Swelling  . Clarithromycin Swelling     Outpatient Medications Prior to Visit  Medication Sig  . amLODipine (NORVASC) 10 MG tablet Take 10 mg by mouth at bedtime.  Marland Kitchen aspirin 81 MG tablet Take 1 tablet (81 mg total) by mouth daily.  Marland Kitchen atorvastatin (LIPITOR) 40 MG tablet Take 40 mg by mouth daily.  Marland Kitchen guanFACINE (TENEX) 2 MG tablet Take 2 mg by mouth at bedtime.  . indapamide (LOZOL) 1.25 MG tablet Take 1.25 mg by mouth every morning.  Marland Kitchen levothyroxine (SYNTHROID) 100 MCG tablet Take 100 mcg by mouth daily.  . Multiple Vitamin (MULTIVITAMIN) capsule Take 1 capsule by mouth daily.  . multivitamin-iron-minerals-folic acid (CENTRUM) chewable tablet Chew 1 tablet by mouth daily.  . [DISCONTINUED] carvedilol (COREG) 12.5 MG tablet Take 1 tablet by mouth twice daily  . [DISCONTINUED] amLODipine-benazepril (LOTREL) 10-20 MG per capsule Take 1 capsule by mouth daily.    . [DISCONTINUED] cephALEXin (KEFLEX) 500 MG capsule Take 1 capsule (500 mg total) by mouth 4 (four) times daily.  . [DISCONTINUED] cyclobenzaprine (FLEXERIL) 10 MG tablet Take 1 tablet (10 mg total) by mouth 2 (two) times daily as needed for muscle  spasms.  . [DISCONTINUED] HYDROcodone-acetaminophen (NORCO) 5-325 MG tablet Take 1 tablet by mouth every 6 (six) hours as needed for moderate pain or severe pain.  . [DISCONTINUED] meclizine (ANTIVERT) 25 MG tablet Take 1 tablet (25 mg total) by mouth 3 (three) times daily as needed for dizziness.  . [DISCONTINUED] oxyCODONE-acetaminophen (PERCOCET/ROXICET) 5-325 MG tablet Take 1 tablet by mouth every 6 (six) hours as needed for severe pain.   No facility-administered medications prior to visit.    Radiology:   No results found.   Cardiac Studies:   Coronary Artery Bypass Graft  [1997]: LIMA TO LAD; SVG TO PD AND PL , SVG TO OM. CARDIAC CATH 2003 PATENT GRAFTS.  Cardiac cath 2003 patent grafts  CABG 1997: LIMA TO LAD; SVG TO PD AND PL , SVG TO OM. CARDIAC CATH 2003 PATENT GRAFTS.  Bilateral iliac stents 2009: H/O PTFE covered- Bilateral iliac stent 2009 for instent restenosis.   Echo- 10/25/13 1. Left ventricular cavity is normal in size. Mild to moderate concentric hypertrophy. Normal global wall motion. Normal systolic global function. Calculated EF 55%. Visually estimated EF is approx. 60%. Doppler evidence of Grade I (impaired) diastolic dysfunction. 2. Left atrial cavity is mildly dilated. 3. Mild aortic regurgitation. 4. Mild calcification of the mitral annulus. Mild to moderate mitral regurgitation. Mitral valve inflow A > E ratio. 5. Tricuspid valve structurally normal. Mild to moderate tricuspid regurgitation. No pulmonary hypertension. 6. c.f. echo. of 08/12/2011, severity of MR & TR has  increased.  Abdominal Aortic Duplex  05/29/2018: The maximum aorta diameter is 2.77 cm (mid). Mild ectasia noted in the mid and distal aorta. See image. Peak systolic velocities in the left internal iliac artery are moderately increased to 208.23 cm/s suggests >50% stenosis. Compared to 10/29/2016, AAA reported was probably an overestimation, image quality was previously poor. Left iliac  artery velocity elevation is new. Consider recheck in 5 years.   EKG:   EKG 10/09/2020: Sinus bradycardia at a rate of 59 bpm. Left axis, left anterior fascicular block.  Right bundle branch block.  Left ventricular hypertrophy with secondary ST-T wave abnormalities, cannot exclude ischemia.  Assessment     ICD-10-CM   1. Coronary artery disease involving coronary bypass graft of native heart without angina pectoris  I25.810 EKG 12-Lead    carvedilol (COREG) 12.5 MG tablet  2. PAD (peripheral artery disease) (HCC)  I73.9 PCV LOWER ARTERIAL (BILATERAL)    PCV AORTA DUPLEX  3. Dyspnea on exertion  R06.00 PCV MYOCARDIAL PERFUSION WITH LEXISCAN  4. RBBB  I45.10   5. Systolic murmur of aorta  I35.8 PCV ECHOCARDIOGRAM COMPLETE     Medications Discontinued During This Encounter  Medication Reason  . cephALEXin (KEFLEX) 500 MG capsule Error  . HYDROcodone-acetaminophen (NORCO) 5-325 MG tablet Error  . oxyCODONE-acetaminophen (PERCOCET/ROXICET) 5-325 MG tablet Error  . meclizine (ANTIVERT) 25 MG tablet Error  . cyclobenzaprine (FLEXERIL) 10 MG tablet Error  . amLODipine-benazepril (LOTREL) 10-20 MG per capsule Error  . carvedilol (COREG) 12.5 MG tablet Reorder    Meds ordered this encounter  Medications  . carvedilol (COREG) 12.5 MG tablet    Sig: Take 1 tablet (12.5 mg total) by mouth 2 (two) times daily.    Dispense:  180 tablet    Refill:  3   Orders Placed This Encounter  Procedures  . PCV MYOCARDIAL PERFUSION WITH LEXISCAN    Standing Status:   Future    Standing Expiration Date:   12/09/2020  . EKG 12-Lead  . PCV ECHOCARDIOGRAM COMPLETE    Standing Status:   Future    Standing Expiration Date:   10/09/2021    Recommendations:   [name] is a 80 y.o. AA female with history of hyperlipidemia, renovascular hypertension S/P renal stent in 1997, CAD S/P CABG in 1997, PAD with bilateral PTFE covered iliac stents in 2009, mild abdominal aortic ectasia noted on CT scan of  the abdomen, PAD, sleep apnea but has not been able to tolerate CPAP.  Patient was previously seen in the office by Dr. Einar Gip, however she was lost to follow-up until now.  Patient presents to reestablish care.  She reports symptoms of exertional dyspnea as well as intermittent episodes of chest pain lasting several seconds.  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  She is relatively inactive otherwise, but denies symptoms of claudication.   In view of patient's symptoms of exertional dyspnea and chest pain as well as history of PAD and decreased bilateral pedal pulses on exam will obtain echocardiogram, nuclear stress test, and ABI.  We will also obtain abdominal aortic duplex in view of history of mild dilation noted on previous CT scan.  In regard to hypertension, will refill patient's carvedilol at this time, advised her to monitor blood pressure on a regular basis at home and bring with her written log to her next appointment.  Will reevaluate blood pressure control at follow-up visit.  Follow-up in 4 weeks, sooner if needed, for results of cardiac testing and hypertension.  Patient was seen in collaboration with Dr. Einar Gip. He also reviewed patient's chart and examined the patient. Dr. Einar Gip is in agreement of the plan.    Alethia Berthold, PA-C 10/10/2020, 8:58 AM Office: [phone]

## 2020-10-11 ENCOUNTER — Other Ambulatory Visit: Payer: Self-pay

## 2020-10-11 ENCOUNTER — Ambulatory Visit: Payer: Medicare Other

## 2020-10-11 DIAGNOSIS — R0600 Dyspnea, unspecified: Secondary | ICD-10-CM

## 2020-10-11 DIAGNOSIS — R0609 Other forms of dyspnea: Secondary | ICD-10-CM

## 2020-10-16 ENCOUNTER — Other Ambulatory Visit: Payer: Medicare Other

## 2020-10-16 NOTE — Progress Notes (Signed)
Spoke to patient she is aware

## 2020-10-16 NOTE — Progress Notes (Signed)
Please inform patient stress test was low risk.  We will discuss further at next office visit.

## 2020-10-27 ENCOUNTER — Ambulatory Visit: Payer: Medicare Other

## 2020-10-27 ENCOUNTER — Other Ambulatory Visit: Payer: Self-pay

## 2020-10-27 DIAGNOSIS — I714 Abdominal aortic aneurysm, without rupture, unspecified: Secondary | ICD-10-CM

## 2020-10-27 DIAGNOSIS — I739 Peripheral vascular disease, unspecified: Secondary | ICD-10-CM

## 2020-10-27 DIAGNOSIS — I358 Other nonrheumatic aortic valve disorders: Secondary | ICD-10-CM

## 2020-10-30 NOTE — Progress Notes (Signed)
Please notify patient echocardiogram is stable compared to previous, will discuss further at next office visit.

## 2020-10-30 NOTE — Progress Notes (Signed)
Called patient, [name], [name]

## 2020-10-30 NOTE — Progress Notes (Signed)
Attempted to call pt, no answer. Left vm requesting call back.

## 2020-11-06 NOTE — Progress Notes (Signed)
Will discuss at upcoming office visit

## 2020-11-10 ENCOUNTER — Ambulatory Visit: Payer: Medicare Other | Admitting: Cardiology

## 2020-11-10 ENCOUNTER — Other Ambulatory Visit: Payer: Self-pay

## 2020-11-10 ENCOUNTER — Encounter: Payer: Self-pay | Admitting: Cardiology

## 2020-11-10 VITALS — BP 140/68 | HR 70 | Temp 98.0°F | Resp 16 | Ht 65.0 in | Wt 195.2 lb

## 2020-11-10 DIAGNOSIS — I739 Peripheral vascular disease, unspecified: Secondary | ICD-10-CM | POA: Diagnosis not present

## 2020-11-10 DIAGNOSIS — I714 Abdominal aortic aneurysm, without rupture, unspecified: Secondary | ICD-10-CM

## 2020-11-10 DIAGNOSIS — I10 Essential (primary) hypertension: Secondary | ICD-10-CM | POA: Diagnosis not present

## 2020-11-10 DIAGNOSIS — R0609 Other forms of dyspnea: Secondary | ICD-10-CM

## 2020-11-10 DIAGNOSIS — I25810 Atherosclerosis of coronary artery bypass graft(s) without angina pectoris: Secondary | ICD-10-CM

## 2020-11-10 DIAGNOSIS — R0600 Dyspnea, unspecified: Secondary | ICD-10-CM

## 2020-11-10 MED ORDER — LOSARTAN POTASSIUM 25 MG PO TABS
25.0000 mg | ORAL_TABLET | Freq: Every evening | ORAL | 3 refills | Status: DC
Start: 2020-11-10 — End: 2021-11-08

## 2020-11-10 NOTE — Progress Notes (Signed)
Primary Physician/Referring:  Lucianne Lei, MD  Patient ID: [name], female    DOB: 01/22/1941, 80 y.o.   MRN: [id number]  Chief Complaint  Patient presents with  . Hypertension  . Results    Echo  . Follow-up   HPI:    [name]  is a 80 y.o. AA female with history of hyperlipidemia, renovascular hypertension S/P renal stent in 1997, CAD S/P CABG in 1997, PAD with bilateral PTFE covered iliac stents in 2009, mild abdominal aortic ectasia noted on CT scan of the abdomen, PAD, sleep apnea but has not been able to tolerate CPAP.  Patient denies history of CKD or diabetes.  Due to exertional dyspnea and intermittent chest pain which was felt to be atypical but in view of known coronary disease and CABG, underwent stress testing, echocardiogram, and also to evaluate for PAD, abdominal aortic duplex and lower extremity arterial duplex was obtained.  No new symptomatology.  Past Medical History:  Diagnosis Date  . Breast cancer [facility])    s/p mastectomy  . CAD (coronary artery disease)    s/p CABG  . DDD (degenerative disc disease), lumbar   . Discoid lupus   . Dyspnea   . GERD (gastroesophageal reflux disease)   . Gout   . HLD (hyperlipidemia)   . HTN (hypertension)   . Hypothyroidism   . Low back pain   . Obesity   . OSA (obstructive sleep apnea)    did not tolerate CPAP  . Osteoporosis   . PVD (peripheral vascular disease) ([place])   . Renal disorder    Past Surgical History:  Procedure Laterality Date  . ANKLE SURGERY  7/05   left  . CORONARY ANGIOPLASTY WITH STENT PLACEMENT  2003   7 total stents placed  . CORONARY ARTERY BYPASS GRAFT  1997  . ELBOW SURGERY     right  . hysterectomy - unknown type    . MASTECTOMY  1985   right  . OOPHORECTOMY    . RENAL ARTERY STENT     Family History  Problem Relation Age of Onset  . Coronary artery disease Other   . Diabetes Other   . Hypertension Other   . Aneurysm Other   . Stroke Other   . Aneurysm Mother   .  Alzheimer's disease Father   . Hypertension Sister   . Hyperlipidemia Sister   . Suicidality Brother   . Hypertension Sister   . Hyperlipidemia Sister   . Hypertension Sister   . Hyperlipidemia Sister   . Drug abuse Son     Social History   Tobacco Use  . Smoking status: Former Smoker    Types: Cigarettes    Quit date: 07/23/1995    Years since quitting: 25.3  . Smokeless tobacco: Never Used  Substance Use Topics  . Alcohol use: No   Marital Status: Widowed  ROS  Review of Systems  Cardiovascular: Positive for chest pain and dyspnea on exertion. Negative for claudication, leg swelling, near-syncope, orthopnea, palpitations, paroxysmal nocturnal dyspnea and syncope.  Gastrointestinal: Negative for melena.  Neurological: Negative for dizziness and weakness.   Objective  Blood pressure 140/68, pulse 70, temperature 98 F (36.7 C), temperature source Temporal, resp. rate 16, height '5\' 5"'  (1.651 m), weight 195 lb 3.2 oz (88.5 kg), SpO2 94 %.  Vitals with BMI 11/10/2020 10/09/2020 10/09/2020  Height '5\' 5"'  - '5\' 5"'   Weight 195 lbs 3 oz - 192 lbs 13 oz  BMI 32.48 -  02.72  Systolic 536 644 034  Diastolic 68 71 66  Pulse 70 59 63     Physical Exam Vitals reviewed.  HENT:     Head: Normocephalic and atraumatic.  Cardiovascular:     Rate and Rhythm: Normal rate and regular rhythm.     Pulses: Intact distal pulses.          Carotid pulses are 2+ on the right side and 2+ on the left side.      Radial pulses are 2+ on the right side and 2+ on the left side.       Femoral pulses are 1+ on the right side with bruit and 2+ on the left side with bruit.      Popliteal pulses are 0 on the right side and 0 on the left side.       Dorsalis pedis pulses are 0 on the right side and 0 on the left side.       Posterior tibial pulses are 1+ on the right side and 1+ on the left side.     Heart sounds: Normal heart sounds. No murmur heard. No gallop.   Pulmonary:     Effort: Pulmonary effort is  normal. No respiratory distress.     Breath sounds: No wheezing, rhonchi or rales.  Abdominal:     General: Bowel sounds are normal. There is no distension.     Palpations: Abdomen is soft.  Musculoskeletal:     Right lower leg: No edema.     Left lower leg: No edema.  Skin:    General: Skin is warm and dry.  Neurological:     General: No focal deficit present.     Mental Status: She is alert and oriented to person, place, and time.    Laboratory examination:    External labs:    Labs 09/19/2020:  Total cholesterol 112, triglycerides 131, HDL 33, LDL 58.  Serum glucose 95 mg, BUN 18, creatinine 1.13, EGFR 53 mL, potassium 3.7, sodium 141.  A1c 6.0%.  TSH markedly reduced at 0.02.  Normal T3 and T4.  Hb 12.6/HCT 38.5, platelets 230.   Medications and allergies   Allergies  Allergen Reactions  . Amoxicillin Swelling  . Clarithromycin Swelling     Outpatient Medications Prior to Visit  Medication Sig  . amLODipine (NORVASC) 10 MG tablet Take 10 mg by mouth at bedtime.  Marland Kitchen aspirin 81 MG tablet Take 1 tablet (81 mg total) by mouth daily.  Marland Kitchen atorvastatin (LIPITOR) 40 MG tablet Take 40 mg by mouth daily.  . carvedilol (COREG) 12.5 MG tablet Take 1 tablet (12.5 mg total) by mouth 2 (two) times daily.  Marland Kitchen guanFACINE (TENEX) 2 MG tablet Take 2 mg by mouth at bedtime.  . indapamide (LOZOL) 1.25 MG tablet Take 1.25 mg by mouth every morning.  Marland Kitchen levothyroxine (SYNTHROID) 100 MCG tablet Take 100 mcg by mouth daily.  . multivitamin-iron-minerals-folic acid (CENTRUM) chewable tablet Chew 1 tablet by mouth daily.  . [DISCONTINUED] Multiple Vitamin (MULTIVITAMIN) capsule Take 1 capsule by mouth daily.   No facility-administered medications prior to visit.    Radiology:   Chest x-ray PA and lateral view 10/18/2018: The heart size borderline. The hila and mediastinum are unremarkable. No pneumothorax. No nodules or masses. No focal infiltrates.  Cardiac Studies:   Coronary  Artery Bypass Graft  [1997]: LIMA TO LAD; SVG TO PD AND PL , SVG TO OM. CARDIAC CATH 2003 PATENT GRAFTS.  Cardiac cath 2003 patent grafts  CABG 1997: LIMA TO LAD; SVG TO PD AND PL , SVG TO OM. CARDIAC CATH 2003 PATENT GRAFTS.  Bilateral iliac stents 2009: H/O PTFE covered- Bilateral iliac stent 2009 for instent restenosis.  Echocardiogram 10/27/2020: Normal LV systolic function with visual EF 55-60%. Left ventricle cavity is normal in size. The left ventricle is normal thickness Presence of a septal bulge. Normal global wall motion. Indeterminate diastolic filling pattern, normal LAP.  Left atrial cavity is mildly dilated. Mild (Grade I) aortic regurgitation. Mild (Grade I) mitral regurgitation. Mild tricuspid regurgitation. No evidence of pulmonary hypertension. RVSP measures 34 mmHg. Mild pulmonic regurgitation. Compared to prior study dated 10/25/2013: No significant change.   Lower Extremity Arterial Duplex 10/27/2020: No hemodynamically significant stenoses are identified in the right and left lower extremity arterial system.  Bilateral profunda femoral arteries show <50% stenosis with severely abnormal monophasic waveform. There is mild diffuse heteregeneous plaque noted through out the bilateral lower extremities. This exam reveals mildly decreased perfusion of the right lower extremity, noted at the post tibial artery level (ABI 0.84) and mildly decreased perfusion of the left lower extremity, noted at the post tibial artery level (ABI 0.84) with mildly abnormal biphasic waveform at the level of the ankles.   Lexiscan Tetrofosmin Stress Test  10/11/2020: Nondiagnostic ECG stress. Myocardial perfusion is abnormal with severe soft tissue attenuation in the infero-lateral wall. mild ischemia cannot be excluded, but appears less likely given normal wall motion in stress images. Overall LV systolic function is normal . Stress LV EF: 58%.  No previous exam available for comparison. Low  risk.   Abdominal Aortic Duplex 10/27/2020: Moderate dilatation of the abdominal aorta is noted in the proximal aorta. An abdominal aortic aneurysm measuring 3.03 x 3.11 x 3.06 cm is seen. Mild ectasia also noted in mid aorta. There is mild diffuse heterogeneous plaque noted.  Left external iliac artery stenosis of <50%.  Compared to 05/29/2018,there is minimal progression of the aneurysm size form 2.77 cm. Recheck in 3 years.    EKG:     EKG 10/09/2020: Sinus bradycardia at a rate of 59 bpm. Left axis, left anterior fascicular block.  Right bundle branch block.  Left ventricular hypertrophy with secondary ST-T wave abnormalities, cannot exclude ischemia.  Assessment     ICD-10-CM   1. Coronary artery disease involving coronary bypass graft of native heart without angina pectoris  I25.810   2. PAD (peripheral artery disease) (H[facility])  I73.9   3. Dyspnea on exertion  R06.00   4. AAA (abdominal aortic aneurysm) without rupture (HCC)  I71.4 losartan (COZAAR) 25 MG tablet  5. Primary hypertension  I10 losartan (COZAAR) 25 MG tablet    Basic metabolic panel    Basic metabolic panel     Medications Discontinued During This Encounter  Medication Reason  . Multiple Vitamin (MULTIVITAMIN) capsule Error    Meds ordered this encounter  Medications  . losartan (COZAAR) 25 MG tablet    Sig: Take 1 tablet (25 mg total) by mouth every evening.    Dispense:  90 tablet    Refill:  3   Orders Placed This Encounter  Procedures  . Basic metabolic panel    Standing Status:   Future    Number of Occurrences:   1    Standing Expiration Date:   11/10/2021    Recommendations:   [name] is a 80 y.o. AA female with history of hyperlipidemia, renovascular hypertension S/P renal stent in 1997, CAD S/P CABG in 1997, PAD  with bilateral PTFE covered iliac stents in 2009, mild abdominal aortic ectasia noted on CT scan of the abdomen, PAD, sleep apnea but has not been able to tolerate CPAP.  Due to  exertional dyspnea and intermittent chest pain which was felt to be atypical but in view of known coronary disease and CABG, underwent stress testing, echocardiogram, and also to evaluate for PAD, abdominal aortic duplex and lower extremity arterial duplex was obtained.  I reviewed the results of each one of the tests with the patient.  I reassured her and advised her to increase her physical activity as tolerated.  Although 80 years of age she looks well.  Her lipids were reviewed, LDL is at goal.  Minimal elevation in triglycerides can be improved upon with weight loss.  She does have stage III chronic kidney disease however in view of abdominal attic aneurysm and hypertension, I have added losartan 25 mg in the evening and will obtain a BMP in 2 weeks.  She does have a small AAA, we will continue surveillance in 2 to 3 years, iliac stents appear to be patent.  Mild decrease in ABI without significant symptoms of claudication, continue observation for now.  Dyspnea on exertion is probably related to underlying deconditioning and hypertension.  I will see him back in 6 months for follow-up.    Adrian Prows, PA-C 11/10/2020, 2:51 PM Office: [phone]

## 2020-11-17 DIAGNOSIS — I25810 Atherosclerosis of coronary artery bypass graft(s) without angina pectoris: Secondary | ICD-10-CM | POA: Diagnosis not present

## 2020-11-17 DIAGNOSIS — R0600 Dyspnea, unspecified: Secondary | ICD-10-CM | POA: Diagnosis not present

## 2020-11-17 DIAGNOSIS — I10 Essential (primary) hypertension: Secondary | ICD-10-CM | POA: Diagnosis not present

## 2020-11-17 DIAGNOSIS — I739 Peripheral vascular disease, unspecified: Secondary | ICD-10-CM | POA: Diagnosis not present

## 2020-11-17 DIAGNOSIS — I714 Abdominal aortic aneurysm, without rupture: Secondary | ICD-10-CM | POA: Diagnosis not present

## 2021-01-12 DIAGNOSIS — Z1231 Encounter for screening mammogram for malignant neoplasm of breast: Secondary | ICD-10-CM | POA: Diagnosis not present

## 2021-02-07 DIAGNOSIS — H1132 Conjunctival hemorrhage, left eye: Secondary | ICD-10-CM | POA: Diagnosis not present

## 2021-02-07 DIAGNOSIS — H26491 Other secondary cataract, right eye: Secondary | ICD-10-CM | POA: Diagnosis not present

## 2021-02-22 ENCOUNTER — Other Ambulatory Visit: Payer: Self-pay | Admitting: Family Medicine

## 2021-02-22 ENCOUNTER — Ambulatory Visit
Admission: RE | Admit: 2021-02-22 | Discharge: 2021-02-22 | Disposition: A | Discharge status: Home / Self Care | Payer: Medicare Other | Source: Ambulatory Visit | Attending: Family Medicine | Admitting: Family Medicine

## 2021-02-22 DIAGNOSIS — M19012 Primary osteoarthritis, left shoulder: Secondary | ICD-10-CM | POA: Diagnosis not present

## 2021-02-22 DIAGNOSIS — M130 Polyarthritis, unspecified: Secondary | ICD-10-CM

## 2021-02-22 DIAGNOSIS — M25511 Pain in right shoulder: Secondary | ICD-10-CM | POA: Diagnosis not present

## 2021-02-22 DIAGNOSIS — I10 Essential (primary) hypertension: Secondary | ICD-10-CM | POA: Diagnosis not present

## 2021-02-22 DIAGNOSIS — E119 Type 2 diabetes mellitus without complications: Secondary | ICD-10-CM | POA: Diagnosis not present

## 2021-02-22 DIAGNOSIS — M79604 Pain in right leg: Secondary | ICD-10-CM | POA: Diagnosis not present

## 2021-02-22 DIAGNOSIS — M25561 Pain in right knee: Secondary | ICD-10-CM | POA: Diagnosis not present

## 2021-02-22 DIAGNOSIS — M25712 Osteophyte, left shoulder: Secondary | ICD-10-CM | POA: Diagnosis not present

## 2021-02-22 DIAGNOSIS — E782 Mixed hyperlipidemia: Secondary | ICD-10-CM | POA: Diagnosis not present

## 2021-02-22 DIAGNOSIS — E039 Hypothyroidism, unspecified: Secondary | ICD-10-CM | POA: Diagnosis not present

## 2021-02-22 DIAGNOSIS — M25512 Pain in left shoulder: Secondary | ICD-10-CM | POA: Diagnosis not present

## 2021-03-05 DIAGNOSIS — Z1212 Encounter for screening for malignant neoplasm of rectum: Secondary | ICD-10-CM | POA: Diagnosis not present

## 2021-03-05 DIAGNOSIS — Z1211 Encounter for screening for malignant neoplasm of colon: Secondary | ICD-10-CM | POA: Diagnosis not present

## 2021-03-13 LAB — COLOGUARD: COLOGUARD: POSITIVE — AB

## 2021-03-20 ENCOUNTER — Other Ambulatory Visit: Payer: Self-pay

## 2021-03-20 ENCOUNTER — Ambulatory Visit: Payer: Medicare Other | Admitting: Student

## 2021-03-20 ENCOUNTER — Encounter (HOSPITAL_BASED_OUTPATIENT_CLINIC_OR_DEPARTMENT_OTHER): Payer: Self-pay | Admitting: *Deleted

## 2021-03-20 ENCOUNTER — Emergency Department (HOSPITAL_BASED_OUTPATIENT_CLINIC_OR_DEPARTMENT_OTHER)
Admission: EM | Admit: 2021-03-20 | Discharge: 2021-03-20 | Disposition: A | Discharge status: Home / Self Care | Payer: Medicare Other | Attending: Emergency Medicine | Admitting: Emergency Medicine

## 2021-03-20 ENCOUNTER — Emergency Department (HOSPITAL_BASED_OUTPATIENT_CLINIC_OR_DEPARTMENT_OTHER): Payer: Medicare Other | Admitting: Radiology

## 2021-03-20 ENCOUNTER — Encounter: Payer: Self-pay | Admitting: Student

## 2021-03-20 VITALS — BP 147/68 | HR 60 | Temp 97.9°F | Ht 64.0 in | Wt 195.0 lb

## 2021-03-20 DIAGNOSIS — I209 Angina pectoris, unspecified: Secondary | ICD-10-CM | POA: Diagnosis not present

## 2021-03-20 DIAGNOSIS — M25512 Pain in left shoulder: Secondary | ICD-10-CM | POA: Insufficient documentation

## 2021-03-20 DIAGNOSIS — E039 Hypothyroidism, unspecified: Secondary | ICD-10-CM | POA: Insufficient documentation

## 2021-03-20 DIAGNOSIS — I25810 Atherosclerosis of coronary artery bypass graft(s) without angina pectoris: Secondary | ICD-10-CM

## 2021-03-20 DIAGNOSIS — R5383 Other fatigue: Secondary | ICD-10-CM | POA: Diagnosis not present

## 2021-03-20 DIAGNOSIS — R079 Chest pain, unspecified: Secondary | ICD-10-CM | POA: Diagnosis not present

## 2021-03-20 DIAGNOSIS — Z7982 Long term (current) use of aspirin: Secondary | ICD-10-CM | POA: Insufficient documentation

## 2021-03-20 DIAGNOSIS — Z951 Presence of aortocoronary bypass graft: Secondary | ICD-10-CM | POA: Diagnosis not present

## 2021-03-20 DIAGNOSIS — R072 Precordial pain: Secondary | ICD-10-CM | POA: Diagnosis not present

## 2021-03-20 DIAGNOSIS — R110 Nausea: Secondary | ICD-10-CM | POA: Insufficient documentation

## 2021-03-20 DIAGNOSIS — Z79899 Other long term (current) drug therapy: Secondary | ICD-10-CM | POA: Diagnosis not present

## 2021-03-20 DIAGNOSIS — I2510 Atherosclerotic heart disease of native coronary artery without angina pectoris: Secondary | ICD-10-CM | POA: Diagnosis not present

## 2021-03-20 DIAGNOSIS — Z87891 Personal history of nicotine dependence: Secondary | ICD-10-CM | POA: Insufficient documentation

## 2021-03-20 DIAGNOSIS — Z9011 Acquired absence of right breast and nipple: Secondary | ICD-10-CM | POA: Insufficient documentation

## 2021-03-20 DIAGNOSIS — R0602 Shortness of breath: Secondary | ICD-10-CM | POA: Diagnosis not present

## 2021-03-20 DIAGNOSIS — R0609 Other forms of dyspnea: Secondary | ICD-10-CM | POA: Diagnosis not present

## 2021-03-20 DIAGNOSIS — R0600 Dyspnea, unspecified: Secondary | ICD-10-CM

## 2021-03-20 DIAGNOSIS — Z853 Personal history of malignant neoplasm of breast: Secondary | ICD-10-CM | POA: Diagnosis not present

## 2021-03-20 DIAGNOSIS — I119 Hypertensive heart disease without heart failure: Secondary | ICD-10-CM | POA: Insufficient documentation

## 2021-03-20 LAB — TROPONIN I (HIGH SENSITIVITY)
Troponin I (High Sensitivity): 18 ng/L — ABNORMAL HIGH (ref <18)
Troponin I (High Sensitivity): 17 ng/L (ref <18)

## 2021-03-20 LAB — CBC
HCT: 37.3 % (ref 36.0–46.0)
Hemoglobin: 12.2 g/dL (ref 12.0–15.0)
MCH: 28.1 pg (ref 26.0–34.0)
MCHC: 32.7 g/dL (ref 30.0–36.0)
MCV: 85.9 fL (ref 80.0–100.0)
Platelets: 196 10*3/uL (ref 150–400)
RBC: 4.34 MIL/uL (ref 3.87–5.11)
RDW: 15.2 % (ref 11.5–15.5)
WBC: 8.9 10*3/uL (ref 4.0–10.5)
nRBC: 0 % (ref 0.0–0.2)

## 2021-03-20 LAB — BASIC METABOLIC PANEL
Anion gap: 10 (ref 5–15)
BUN: 23 mg/dL (ref 8–23)
CO2: 26 mmol/L (ref 22–32)
Calcium: 10.4 mg/dL — ABNORMAL HIGH (ref 8.9–10.3)
Chloride: 103 mmol/L (ref 98–111)
Creatinine, Ser: 1.33 mg/dL — ABNORMAL HIGH (ref 0.44–1.00)
GFR, Estimated: 40 mL/min — ABNORMAL LOW (ref >60)
Glucose, Bld: 106 mg/dL — ABNORMAL HIGH (ref 70–99)
Potassium: 3.2 mmol/L — ABNORMAL LOW (ref 3.5–5.1)
Sodium: 139 mmol/L (ref 135–145)

## 2021-03-20 MED ORDER — ISOSORBIDE MONONITRATE ER 30 MG PO TB24: 30.0000 mg | ORAL_TABLET | Freq: Every day | ORAL | 0 refills | Status: DC

## 2021-03-20 MED ORDER — ASPIRIN 81 MG PO CHEW
324.0000 mg | CHEWABLE_TABLET | Freq: Once | ORAL | Status: AC
  Administered 2021-03-20: 324 mg via ORAL
  Filled 2021-03-20: qty 4

## 2021-03-20 MED ORDER — NITROGLYCERIN 2 % TD OINT
1.0000 [in_us] | TOPICAL_OINTMENT | Freq: Once | TRANSDERMAL | Status: AC
  Administered 2021-03-20: 1 [in_us] via TOPICAL
  Filled 2021-03-20: qty 1

## 2021-03-20 NOTE — ED Notes (Signed)
Pt ambulated to bathroom with minimal assistance, gait steady. Warm blankets given to pt when pt returned to room.

## 2021-03-20 NOTE — Progress Notes (Signed)
Primary Physician/Referring:  Lucianne Lei, MD  Patient ID: [name], female    DOB: 1941/06/04, 80 y.o.   MRN: [id number]  Chief Complaint  Patient presents with   Chest Pain   Follow-up   Hospitalization Follow-up   HPI:    [name]  is a 80 y.o. AA female with history of hyperlipidemia, renovascular hypertension S/P renal stent in 1997, CAD S/P CABG in 1997, PAD with bilateral PTFE covered iliac stents in 2009, mild abdominal aortic ectasia noted on CT scan of the abdomen, PAD, sleep apnea but has not been able to tolerate CPAP.  Patient denies history of CKD or diabetes.  Patient presents for urgent visit as she was evaluated in emergency department yesterday with chest pain, left arm pain, and shortness of breath.Patient underwent recent echocardiogram and nuclear stress test in 10/2020 which were reassuring.  However patient states yesterday she developed substernal chest pain around 10 PM which was constant and radiating to the left arm with associated shortness of breath.  Presentation in the emergency department revealed EKG without acute ischemic changes, serial troponins negative.  However patient's chest pain was suggestive of angina pectoris and responsive to nitroglycerin.  Patient was discharged with Imdur 30 mg daily and advised for close follow-up.  Patient now presents for follow-up.  She states chest pain has improved since yesterday although she still has brief intermittent episodes.  She has not started Imdur.  Past Medical History:  Diagnosis Date   Breast cancer ([place])    s/p mastectomy   CAD (coronary artery disease)    s/p CABG   DDD (degenerative disc disease), lumbar    Discoid lupus    Dyspnea    GERD (gastroesophageal reflux disease)    Gout    HLD (hyperlipidemia)    HTN (hypertension)    Hypothyroidism    Low back pain    Obesity    OSA (obstructive sleep apnea)    did not tolerate CPAP   Osteoporosis    PVD (peripheral vascular disease)  ([place])    Renal disorder    Past Surgical History:  Procedure Laterality Date   ANKLE SURGERY  7/05   left   CORONARY ANGIOPLASTY WITH STENT PLACEMENT  2003   7 total stents placed   CORONARY ARTERY BYPASS GRAFT  1997   ELBOW SURGERY     right   hysterectomy - unknown type     MASTECTOMY  1985   right   OOPHORECTOMY     RENAL ARTERY STENT     Family History  Problem Relation Age of Onset   Coronary artery disease Other    Diabetes Other    Hypertension Other    Aneurysm Other    Stroke Other    Aneurysm Mother    Alzheimer's disease Father    Hypertension Sister    Hyperlipidemia Sister    Suicidality Brother    Hypertension Sister    Hyperlipidemia Sister    Hypertension Sister    Hyperlipidemia Sister    Drug abuse Son     Social History   Tobacco Use   Smoking status: Former    Types: Cigarettes    Quit date: 07/23/1995    Years since quitting: 25.6   Smokeless tobacco: Never  Substance Use Topics   Alcohol use: No   Marital Status: Widowed  ROS  Review of Systems  Cardiovascular:  Positive for chest pain and dyspnea on exertion. Negative for claudication, leg swelling, near-syncope,  orthopnea, palpitations, paroxysmal nocturnal dyspnea and syncope.  Objective  Blood pressure (!) 147/68, pulse 60, temperature 97.9 F (36.6 C), height '5\' 4"'  (1.626 m), weight 195 lb (88.5 kg), SpO2 99 %.  Vitals with BMI 03/20/2021 03/20/2021 03/20/2021  Height '5\' 4"'  - -  Weight 195 lbs - -  BMI 36.12 - -  Systolic 244 975 300  Diastolic 68 56 56  Pulse 60 62 53     Physical Exam Vitals reviewed.  Cardiovascular:     Rate and Rhythm: Normal rate and regular rhythm.     Pulses: Intact distal pulses.          Carotid pulses are 2+ on the right side and 2+ on the left side.      Radial pulses are 2+ on the right side and 2+ on the left side.       Femoral pulses are 1+ on the right side with bruit and 2+ on the left side with bruit.      Popliteal pulses are 0 on the  right side and 0 on the left side.       Dorsalis pedis pulses are 0 on the right side and 0 on the left side.       Posterior tibial pulses are 1+ on the right side and 1+ on the left side.     Heart sounds: Normal heart sounds. No murmur heard.   No gallop.  Pulmonary:     Effort: Pulmonary effort is normal. No respiratory distress.     Breath sounds: No wheezing, rhonchi or rales.  Musculoskeletal:     Right lower leg: No edema.     Left lower leg: No edema.  Skin:    General: Skin is warm and dry.  Neurological:     General: No focal deficit present.     Mental Status: She is alert and oriented to person, place, and time.   Laboratory examination:   CMP Latest Ref Rng & Units 03/20/2021 10/18/2018 03/25/2016  Glucose 70 - 99 mg/dL 106(H) 98 91  BUN 8 - 23 mg/dL '23 13 17  ' Creatinine 0.44 - 1.00 mg/dL 1.33(H) 1.05(H) 0.93  Sodium 135 - 145 mmol/L 139 139 138  Potassium 3.5 - 5.1 mmol/L 3.2(L) 4.4 3.9  Chloride 98 - 111 mmol/L 103 110 112(H)  CO2 22 - 32 mmol/L 26 24 20(L)  Calcium 8.9 - 10.3 mg/dL 10.4(H) 9.8 8.9  Total Protein 6.5 - 8.1 g/dL - - -  Total Bilirubin 0.3 - 1.2 mg/dL - - -  Alkaline Phos 38 - 126 U/L - - -  AST 15 - 41 U/L - - -  ALT 14 - 54 U/L - - -   CBC Latest Ref Rng & Units 03/20/2021 10/18/2018 02/21/2016  WBC 4.0 - 10.5 K/uL 8.9 6.0 5.5  Hemoglobin 12.0 - 15.0 g/dL 12.2 11.8(L) 12.7  Hematocrit 36.0 - 46.0 % 37.3 38.5 39.2  Platelets 150 - 400 K/uL 196 198 202   Lipid Panel     Component Value Date/Time   CHOL 107 12/10/2010 0910   TRIG 93.0 12/10/2010 0910   HDL 37.40 (L) 12/10/2010 0910   CHOLHDL 3 12/10/2010 0910   VLDL 18.6 12/10/2010 0910   LDLCALC 51 12/10/2010 0910   HEMOGLOBIN A1C No results found for: HGBA1C, MPG TSH No results for input(s): TSH in the last 8760 hours.  Troponin 03/19/2021: 18 --> 17  External labs:  09/19/2020:  Total cholesterol 112, triglycerides 131, HDL 33,  LDL 58.  Serum glucose 95 mg, BUN 18, creatinine  1.13, EGFR 53 mL, potassium 3.7, sodium 141.  A1c 6.0%.  TSH markedly reduced at 0.02.  Normal T3 and T4.  Hb 12.6/HCT 38.5, platelets 230.  Allergies   Allergies  Allergen Reactions   Amoxicillin Swelling   Clarithromycin Swelling    Medications Prior to Visit:   Outpatient Medications Prior to Visit  Medication Sig Dispense Refill   amLODipine (NORVASC) 10 MG tablet Take 10 mg by mouth at bedtime.     aspirin 81 MG tablet Take 1 tablet (81 mg total) by mouth daily. 30 tablet 6   atorvastatin (LIPITOR) 40 MG tablet Take 40 mg by mouth daily.     carvedilol (COREG) 12.5 MG tablet Take 1 tablet (12.5 mg total) by mouth 2 (two) times daily. 180 tablet 3   diclofenac Sodium (VOLTAREN) 1 % GEL SMARTSIG:2 Gel Topical 4 Times Daily     guanFACINE (TENEX) 2 MG tablet Take 2 mg by mouth at bedtime.     indapamide (LOZOL) 1.25 MG tablet Take 1.25 mg by mouth every morning.     levothyroxine (SYNTHROID) 100 MCG tablet Take 100 mcg by mouth daily.     losartan (COZAAR) 25 MG tablet Take 1 tablet (25 mg total) by mouth every evening. 90 tablet 3   multivitamin-iron-minerals-folic acid (CENTRUM) chewable tablet Chew 1 tablet by mouth daily.     isosorbide mononitrate (IMDUR) 30 MG 24 hr tablet Take 1 tablet (30 mg total) by mouth daily. Do not take if top blood pressure number (systolic blood pressure) is less than 100. (Patient not taking: Reported on 03/20/2021) 30 tablet 0   No facility-administered medications prior to visit.   Final Medications at End of Visit    Current Meds  Medication Sig   amLODipine (NORVASC) 10 MG tablet Take 10 mg by mouth at bedtime.   aspirin 81 MG tablet Take 1 tablet (81 mg total) by mouth daily.   atorvastatin (LIPITOR) 40 MG tablet Take 40 mg by mouth daily.   carvedilol (COREG) 12.5 MG tablet Take 1 tablet (12.5 mg total) by mouth 2 (two) times daily.   diclofenac Sodium (VOLTAREN) 1 % GEL SMARTSIG:2 Gel Topical 4 Times Daily   guanFACINE (TENEX) 2 MG  tablet Take 2 mg by mouth at bedtime.   indapamide (LOZOL) 1.25 MG tablet Take 1.25 mg by mouth every morning.   levothyroxine (SYNTHROID) 100 MCG tablet Take 100 mcg by mouth daily.   losartan (COZAAR) 25 MG tablet Take 1 tablet (25 mg total) by mouth every evening.   multivitamin-iron-minerals-folic acid (CENTRUM) chewable tablet Chew 1 tablet by mouth daily.   Radiology:   Chest x-ray PA and lateral view 10/18/2018: The heart size borderline. The hila and mediastinum are unremarkable. No pneumothorax. No nodules or masses. No focal infiltrates.  Cardiac Studies:   Coronary Artery Bypass Graft   [1997]: LIMA TO LAD; SVG TO PD AND PL , SVG TO OM. CARDIAC CATH 2003 PATENT GRAFTS.  Cardiac cath 2003 patent grafts   CABG 1997: LIMA TO LAD; SVG TO PD AND PL , SVG TO OM. CARDIAC CATH 2003 PATENT GRAFTS.  Bilateral iliac stents 2009: H/O PTFE covered- Bilateral iliac stent 2009 for instent restenosis.  Echocardiogram 10/27/2020: Normal LV systolic function with visual EF 55-60%. Left ventricle cavity is normal in size. The left ventricle is normal thickness Presence of a septal bulge. Normal global wall motion. Indeterminate diastolic filling pattern, normal LAP.  Left  atrial cavity is mildly dilated. Mild (Grade I) aortic regurgitation. Mild (Grade I) mitral regurgitation. Mild tricuspid regurgitation. No evidence of pulmonary hypertension. RVSP measures 34 mmHg. Mild pulmonic regurgitation. Compared to prior study dated 10/25/2013: No significant change.   Lower Extremity Arterial Duplex 10/27/2020: No hemodynamically significant stenoses are identified in the right and left lower extremity arterial system.  Bilateral profunda femoral arteries show <50% stenosis with severely abnormal monophasic waveform. There is mild diffuse heteregeneous plaque noted through out the bilateral lower extremities. This exam reveals mildly decreased perfusion of the right lower extremity, noted at the  post tibial artery level (ABI 0.84) and mildly decreased perfusion of the left lower extremity, noted at the post tibial artery level (ABI 0.84) with mildly abnormal biphasic waveform at the level of the ankles.   Lexiscan Tetrofosmin Stress Test  10/11/2020: Nondiagnostic ECG stress. Myocardial perfusion is abnormal with severe soft tissue attenuation in the infero-lateral wall. mild ischemia cannot be excluded, but appears less likely given normal wall motion in stress images. Overall LV systolic function is normal . Stress LV EF: 58%.  No previous exam available for comparison. Low risk.   Abdominal Aortic Duplex 10/27/2020: Moderate dilatation of the abdominal aorta is noted in the proximal aorta. An abdominal aortic aneurysm measuring 3.03 x 3.11 x 3.06 cm is seen. Mild ectasia also noted in mid aorta. There is mild diffuse heterogeneous plaque noted.  Left external iliac artery stenosis of <50%.  Compared to 05/29/2018,there is minimal progression of the aneurysm size form 2.77 cm. Recheck in 3 years.    EKG:   03/20/2021: Sinus bradycardia at a rate of 56 bpm.  Left axis, left anterior fascicular block.  Right bundle branch block.  LVH with secondary ST-T wave changes.  Bradycardia EKG 10/09/2020, no significant change.  EKG 10/09/2020: Sinus bradycardia at a rate of 59 bpm. Left axis, left anterior fascicular block.  Right bundle branch block.  Left ventricular hypertrophy with secondary ST-T wave abnormalities, cannot exclude ischemia.  Assessment     ICD-10-CM   1. Angina pectoris ([place])  I20.9 EKG 12-Lead    2. Coronary artery disease involving coronary bypass graft of native heart without angina pectoris  I25.810     3. Dyspnea on exertion  R06.00        There are no discontinued medications.   No orders of the defined types were placed in this encounter.  Orders Placed This Encounter  Procedures   EKG 12-Lead    Recommendations:   Eriona P Young is a 80 y.o. AA  female with history of hyperlipidemia, renovascular hypertension S/P renal stent in 1997, CAD S/P CABG in 1997, PAD with bilateral PTFE covered iliac stents in 2009, mild abdominal aortic ectasia noted on CT scan of the abdomen, PAD, sleep apnea but has not been able to tolerate CPAP.  Patient presents for urgent visit following emergency department evaluation for chest pain and dyspnea yesterday.  Patient's symptoms suggestive of angina pectoris and responsive to nitroglycerin.  Advised patient to start Imdur 30 mg daily, which will also improve blood pressure control.  Patient has had recent echocardiogram and nuclear stress testing, which were reassuring.  However given her history of CAD s/p CABG would recommend further evaluation with left heart catheterization.  Counseled patient regarding signs and symptoms that would warrant urgent or emergent evaluation.  The left heart catheterization procedure was explained to the patient in detail. The indication, alternatives, risks and benefits were reviewed. Complications including but not  limited to bleeding, infection, acute kidney injury, blood transfusion, heart rhythm disturbances, contrast (dye) reaction, damage to the arteries or nerves in the legs or hands, cerebrovascular accident, myocardial infarction, need for emergent bypass surgery, blood clots in the legs, possible need for emergent blood transfusion, and rarely death were reviewed and discussed with the patient. The patient voices understanding and wishes to proceed.  Follow-up after cardiac catheterization.  Patient was seen in collaboration with Dr. Einar Gip and he is in agreement with the plan.     Alethia Berthold, PA-C 03/20/2021, 11:02 AM Office: [phone]

## 2021-03-20 NOTE — ED Provider Notes (Signed)
[facility] Provider Note  CSN: [id number] Arrival date & time: 03/20/21 0148  Chief Complaint(s) Chest Pain  HPI [name] is a 80 y.o. female    Chest Pain Pain location:  Substernal area and L chest Pain quality: pressure   Pain radiates to:  L shoulder Pain severity:  Moderate Onset quality:  Sudden Duration:  6 hours Timing:  Constant Progression:  Unchanged Chronicity: similar, but less intense than prior MI. Relieved by:  None tried Worsened by:  Exertion Associated symptoms: fatigue, nausea and shortness of breath   Associated symptoms: no cough, no fever, no heartburn and no vomiting   Risk factors: coronary artery disease, high cholesterol and hypertension    Past Medical History Past Medical History:  Diagnosis Date   Breast cancer ([place])    s/p mastectomy   CAD (coronary artery disease)    s/p CABG   DDD (degenerative disc disease), lumbar    Discoid lupus    Dyspnea    GERD (gastroesophageal reflux disease)    Gout    HLD (hyperlipidemia)    HTN (hypertension)    Hypothyroidism    Low back pain    Obesity    OSA (obstructive sleep apnea)    did not tolerate CPAP   Osteoporosis    PVD (peripheral vascular disease) ([place])    Renal disorder    Patient Active Problem List   Diagnosis Date Noted   ARF (acute renal failure) ([place]) 04/10/2013   Hyperkalemia 04/10/2013   Hypotension, unspecified 04/10/2013   Sinus bradycardia 04/10/2013   DYSPNEA 01/03/2010   LUPUS ERYTHEMATOSUS, DISCOID 06/12/2007   HYPOTHYROIDISM 06/11/2007   HYPERLIPIDEMIA 06/11/2007   GOUT 06/11/2007   HYPERTENSION 06/11/2007   CORONARY ARTERY DISEASE 06/11/2007   PERIPHERAL VASCULAR DISEASE 06/11/2007   GERD 06/11/2007   LOW BACK PAIN 06/11/2007   OSTEOPOROSIS 06/11/2007   BREAST CANCER, HX OF 06/11/2007   Home Medication(s) Prior to Admission medications   Medication Sig Start Date End Date Taking? Authorizing Provider  isosorbide  mononitrate (IMDUR) 30 MG 24 hr tablet Take 1 tablet (30 mg total) by mouth daily. Do not take if top blood pressure number (systolic blood pressure) is less than 100. 03/20/21 04/19/21 Yes Alvah Lagrow, Grayce Sessions, MD  amLODipine (NORVASC) 10 MG tablet Take 10 mg by mouth at bedtime. 09/05/20   [provider]  aspirin 81 MG tablet Take 1 tablet (81 mg total) by mouth daily. 12/10/10   Larey Dresser, MD  atorvastatin (LIPITOR) 40 MG tablet Take 40 mg by mouth daily.    [provider]  carvedilol (COREG) 12.5 MG tablet Take 1 tablet (12.5 mg total) by mouth 2 (two) times daily. 10/09/20   Cantwell, Celeste C, PA-C  guanFACINE (TENEX) 2 MG tablet Take 2 mg by mouth at bedtime.    [provider]  indapamide (LOZOL) 1.25 MG tablet Take 1.25 mg by mouth every morning. 08/16/20   [provider]  levothyroxine (SYNTHROID) 100 MCG tablet Take 100 mcg by mouth daily.    [provider]  losartan (COZAAR) 25 MG tablet Take 1 tablet (25 mg total) by mouth every evening. 11/10/20 11/05/21  Adrian Prows, MD  multivitamin-iron-minerals-folic acid (CENTRUM) chewable tablet Chew 1 tablet by mouth daily.    [provider]  Past Surgical History Past Surgical History:  Procedure Laterality Date   ANKLE SURGERY  7/05   left   CORONARY ANGIOPLASTY WITH STENT PLACEMENT  2003   7 total stents placed   CORONARY ARTERY BYPASS GRAFT  1997   ELBOW SURGERY     right   hysterectomy - unknown type     MASTECTOMY  1985   right   OOPHORECTOMY     RENAL ARTERY STENT     Family History Family History  Problem Relation Age of Onset   Coronary artery disease Other    Diabetes Other    Hypertension Other    Aneurysm Other    Stroke Other    Aneurysm Mother    Alzheimer's disease Father    Hypertension Sister    Hyperlipidemia Sister     Suicidality Brother    Hypertension Sister    Hyperlipidemia Sister    Hypertension Sister    Hyperlipidemia Sister    Drug abuse Son     Social History Social History   Tobacco Use   Smoking status: Former    Types: Cigarettes    Quit date: 07/23/1995    Years since quitting: 25.6   Smokeless tobacco: Never  Vaping Use   Vaping Use: Never used  Substance Use Topics   Alcohol use: No   Drug use: No   Allergies Amoxicillin and Clarithromycin  Review of Systems Review of Systems  Constitutional:  Positive for fatigue. Negative for fever.  Respiratory:  Positive for shortness of breath. Negative for cough.   Cardiovascular:  Positive for chest pain.  Gastrointestinal:  Positive for nausea. Negative for heartburn and vomiting.  All other systems are reviewed and are negative for acute change except as noted in the HPI  Physical Exam Vital Signs  I have reviewed the triage vital signs BP (!) 149/57   Pulse (!) 58   Temp 98.4 F (36.9 C)   Resp 18   Ht '5\' 4"'$  (1.626 m)   Wt 88 kg   SpO2 96%   BMI 33.30 kg/m   Physical Exam Vitals reviewed.  Constitutional:      General: She is not in acute distress.    Appearance: She is well-developed. She is not diaphoretic.  HENT:     Head: Normocephalic and atraumatic.     Nose: Nose normal.  Eyes:     General: No scleral icterus.       Right eye: No discharge.        Left eye: No discharge.     Conjunctiva/sclera: Conjunctivae normal.     Pupils: Pupils are equal, round, and reactive to light.  Cardiovascular:     Rate and Rhythm: Normal rate and regular rhythm.     Heart sounds: No murmur heard.   No friction rub. No gallop.  Pulmonary:     Effort: Pulmonary effort is normal. No respiratory distress.     Breath sounds: Normal breath sounds. No stridor. No rales.  Chest:     Chest wall: No tenderness.    Abdominal:     General: There is no distension.     Palpations: Abdomen is soft.     Tenderness: There is no  abdominal tenderness.  Musculoskeletal:        General: No tenderness.     Cervical back: Normal range of motion and neck supple.  Skin:    General: Skin is warm and dry.     Findings: No erythema or rash.  Neurological:     Mental Status: She is alert and oriented to person, place, and time.    ED Results and Treatments Labs (all labs ordered are listed, but only abnormal results are displayed) Labs Reviewed  BASIC METABOLIC PANEL - Abnormal; Notable for the following components:      Result Value   Potassium 3.2 (*)    Glucose, Bld 106 (*)    Creatinine, Ser 1.33 (*)    Calcium 10.4 (*)    GFR, Estimated 40 (*)    All other components within normal limits  TROPONIN I (HIGH SENSITIVITY) - Abnormal; Notable for the following components:   Troponin I (High Sensitivity) 18 (*)    All other components within normal limits  CBC  TROPONIN I (HIGH SENSITIVITY)                                                                                                                         EKG  EKG Interpretation  Date/Time:  Tuesday March 20 2021 02:01:22 EDT Ventricular Rate:  59 PR Interval:  184 QRS Duration: 136 QT Interval:  472 QTC Calculation: 467 R Axis:   -50 Text Interpretation: Sinus bradycardia Right bundle branch block Left anterior fascicular block ** Bifascicular block ** Minimal voltage criteria for LVH, may be normal variant ( R in aVL ) Possible Lateral infarct , age undetermined Abnormal ECG similar to 09/19/2018 Confirmed by Addison Lank [phone]) on 03/20/2021 2:30:03 AM       Radiology DG Chest 2 View  Result Date: 03/20/2021 CLINICAL DATA:  Chest pain. EXAM: CHEST - 2 VIEW COMPARISON:  Chest radiograph dated 10/18/2018 FINDINGS: No focal consolidation, pleural effusion or pneumothorax. The cardiac silhouette is within limits. Atherosclerotic calcification of the aorta. The aorta is tortuous. Median sternotomy wires and CABG vascular clips. No acute osseous  pathology. IMPRESSION: No active cardiopulmonary disease. Electronically Signed   By: Anner Crete M.D.   On: 03/20/2021 02:51    Pertinent labs & imaging results that were available during my care of the patient were reviewed by me and considered in my medical decision making (see MDM for details).  Medications Ordered in ED Medications  nitroGLYCERIN (NITROGLYN) 2 % ointment 1 inch (1 inch Topical Given 03/20/21 0432)  aspirin chewable tablet 324 mg (324 mg Oral Given 03/20/21 0435)  Procedures Procedures  (including critical care time)  Medical Decision Making / ED Course I have reviewed the nursing notes for this encounter and the patient's prior records (if available in EHR or on provided paperwork).  [name] was evaluated in Emergency Department on 03/20/2021 for the symptoms described in the history of present illness. She was evaluated in the context of the global COVID-19 pandemic, which necessitated consideration that the patient might be at risk for infection with the SARS-CoV-2 virus that causes COVID-19. Institutional protocols and algorithms that pertain to the evaluation of patients at risk for COVID-19 are in a state of rapid change based on information released by regulatory bodies including the CDC and federal and state organizations. These policies and algorithms were followed during the patient's care in the ED.     Substernal chest pain. Significant cardiac history. Patient followed by Dr. Einar Gip. Had a stress test and echo recently which were reassuring.  Pertinent labs & imaging results that were available during my care of the patient were reviewed by me and considered in my medical decision making:  EKG without acute ischemic changes. Initial troponin slightly elevated at 18.  Second troponin with downtrending. Pain improved  with nitroglycerin and aspirin.  Presentation not classic for aortic dissection or esophageal perforation. Low suspicion for pulmonary embolism.  Chest x-ray without evidence suggestive of pneumonia, pneumothorax, pneumomediastinum.  No abnormal contour of the mediastinum to suggest dissection. No evidence of acute injuries.  Discussed case with cardiology who felt patient was stable for discharge home with close follow-up in their clinic.  They did recommend starting the patient on 30 mg of daily Imdur.  Final Clinical Impression(s) / ED Diagnoses Final diagnoses:  Substernal chest pain relieved by nitroglycerin   The patient appears reasonably screened and/or stabilized for discharge and I doubt any other medical condition or other [facility] requiring further screening, evaluation, or treatment in the ED at this time prior to discharge. Safe for discharge with strict return precautions.  Disposition: Discharge  Condition: Good  I have discussed the results, Dx and Tx plan with the patient/family who expressed understanding and agree(s) with the plan. Discharge instructions discussed at length. The patient/family was given strict return precautions who verbalized understanding of the instructions. No further questions at time of discharge.    ED Discharge Orders          Ordered    isosorbide mononitrate (IMDUR) 30 MG 24 hr tablet  Daily        03/20/21 0703           Follow Up: Adrian Prows, MD [place] [phone]  Call  if you have not been called about your appointment     This chart was dictated using voice recognition software.  Despite best efforts to proofread,  errors can occur which can change the documentation meaning.    Fatima Blank, MD 03/20/21 [phone]

## 2021-03-20 NOTE — ED Triage Notes (Signed)
Pt reports onset of heaviness in her left chest area that started about 2200, awoke her from her sleep. Associated with SOB and nausea.

## 2021-04-24 DIAGNOSIS — E7800 Pure hypercholesterolemia, unspecified: Secondary | ICD-10-CM | POA: Diagnosis not present

## 2021-04-24 DIAGNOSIS — I10 Essential (primary) hypertension: Secondary | ICD-10-CM | POA: Diagnosis not present

## 2021-04-30 DIAGNOSIS — I209 Angina pectoris, unspecified: Secondary | ICD-10-CM

## 2021-05-01 ENCOUNTER — Other Ambulatory Visit: Payer: Self-pay

## 2021-05-01 ENCOUNTER — Ambulatory Visit (HOSPITAL_COMMUNITY)
Admission: RE | Admit: 2021-05-01 | Discharge: 2021-05-02 | Disposition: A | Discharge status: Home / Self Care | Payer: Medicare Other | Source: Ambulatory Visit | Attending: Cardiology | Admitting: Cardiology

## 2021-05-01 ENCOUNTER — Encounter (HOSPITAL_COMMUNITY): Payer: Self-pay | Admitting: Cardiology

## 2021-05-01 ENCOUNTER — Encounter (HOSPITAL_COMMUNITY)
Admission: RE | Disposition: A | Discharge status: Home / Self Care | Payer: Self-pay | Source: Ambulatory Visit | Attending: Cardiology

## 2021-05-01 DIAGNOSIS — I209 Angina pectoris, unspecified: Secondary | ICD-10-CM

## 2021-05-01 DIAGNOSIS — Z79899 Other long term (current) drug therapy: Secondary | ICD-10-CM | POA: Diagnosis not present

## 2021-05-01 DIAGNOSIS — I25118 Atherosclerotic heart disease of native coronary artery with other forms of angina pectoris: Secondary | ICD-10-CM | POA: Insufficient documentation

## 2021-05-01 DIAGNOSIS — R0600 Dyspnea, unspecified: Secondary | ICD-10-CM | POA: Diagnosis not present

## 2021-05-01 DIAGNOSIS — Z87891 Personal history of nicotine dependence: Secondary | ICD-10-CM | POA: Insufficient documentation

## 2021-05-01 DIAGNOSIS — Z7982 Long term (current) use of aspirin: Secondary | ICD-10-CM | POA: Insufficient documentation

## 2021-05-01 DIAGNOSIS — I10 Essential (primary) hypertension: Secondary | ICD-10-CM | POA: Diagnosis not present

## 2021-05-01 DIAGNOSIS — E785 Hyperlipidemia, unspecified: Secondary | ICD-10-CM | POA: Diagnosis not present

## 2021-05-01 DIAGNOSIS — G4733 Obstructive sleep apnea (adult) (pediatric): Secondary | ICD-10-CM | POA: Diagnosis not present

## 2021-05-01 DIAGNOSIS — Z951 Presence of aortocoronary bypass graft: Secondary | ICD-10-CM | POA: Diagnosis not present

## 2021-05-01 DIAGNOSIS — I2510 Atherosclerotic heart disease of native coronary artery without angina pectoris: Secondary | ICD-10-CM | POA: Diagnosis present

## 2021-05-01 DIAGNOSIS — Z881 Allergy status to other antibiotic agents status: Secondary | ICD-10-CM | POA: Insufficient documentation

## 2021-05-01 DIAGNOSIS — Z79890 Hormone replacement therapy (postmenopausal): Secondary | ICD-10-CM | POA: Diagnosis not present

## 2021-05-01 DIAGNOSIS — I739 Peripheral vascular disease, unspecified: Secondary | ICD-10-CM | POA: Insufficient documentation

## 2021-05-01 HISTORY — PX: LEFT HEART CATH AND CORONARY ANGIOGRAPHY: CATH118249

## 2021-05-01 LAB — CBC
HCT: 38.6 % (ref 36.0–46.0)
Hemoglobin: 12.2 g/dL (ref 12.0–15.0)
MCH: 28 pg (ref 26.0–34.0)
MCHC: 31.6 g/dL (ref 30.0–36.0)
MCV: 88.7 fL (ref 80.0–100.0)
Platelets: 205 10*3/uL (ref 150–400)
RBC: 4.35 MIL/uL (ref 3.87–5.11)
RDW: 14.7 % (ref 11.5–15.5)
WBC: 6.4 10*3/uL (ref 4.0–10.5)
nRBC: 0 % (ref 0.0–0.2)

## 2021-05-01 LAB — BASIC METABOLIC PANEL
Anion gap: 9 (ref 5–15)
BUN: 19 mg/dL (ref 8–23)
CO2: 25 mmol/L (ref 22–32)
Calcium: 10.1 mg/dL (ref 8.9–10.3)
Chloride: 103 mmol/L (ref 98–111)
Creatinine, Ser: 1.3 mg/dL — ABNORMAL HIGH (ref 0.44–1.00)
GFR, Estimated: 41 mL/min — ABNORMAL LOW (ref >60)
Glucose, Bld: 114 mg/dL — ABNORMAL HIGH (ref 70–99)
Potassium: 4.1 mmol/L (ref 3.5–5.1)
Sodium: 137 mmol/L (ref 135–145)

## 2021-05-01 SURGERY — LEFT HEART CATH AND CORONARY ANGIOGRAPHY
Anesthesia: LOCAL

## 2021-05-01 MED ORDER — ASPIRIN EC 81 MG PO TBEC
81.0000 mg | DELAYED_RELEASE_TABLET | Freq: Every day | ORAL | Status: DC
  Administered 2021-05-02: 81 mg via ORAL
  Filled 2021-05-01: qty 1

## 2021-05-01 MED ORDER — ASPIRIN 81 MG PO CHEW: 81.0000 mg | CHEWABLE_TABLET | ORAL | Status: DC

## 2021-05-01 MED ORDER — HYDRALAZINE HCL 20 MG/ML IJ SOLN
INTRAMUSCULAR | Status: AC
  Filled 2021-05-01: qty 1

## 2021-05-01 MED ORDER — ATORVASTATIN CALCIUM 40 MG PO TABS
40.0000 mg | ORAL_TABLET | Freq: Every day | ORAL | Status: DC
  Administered 2021-05-01 – 2021-05-02 (×2): 40 mg via ORAL
  Filled 2021-05-01 (×2): qty 1

## 2021-05-01 MED ORDER — SODIUM CHLORIDE 0.9% FLUSH: 3.0000 mL | INTRAVENOUS | Status: DC | PRN

## 2021-05-01 MED ORDER — LIDOCAINE HCL (PF) 1 % IJ SOLN
INTRAMUSCULAR | Status: DC | PRN
  Administered 2021-05-01: 15 mL

## 2021-05-01 MED ORDER — SODIUM CHLORIDE 0.9 % WEIGHT BASED INFUSION: 1.0000 mL/kg/h | INTRAVENOUS | Status: DC

## 2021-05-01 MED ORDER — INDAPAMIDE 1.25 MG PO TABS
1.2500 mg | ORAL_TABLET | Freq: Every morning | ORAL | Status: DC
  Filled 2021-05-01: qty 1

## 2021-05-01 MED ORDER — HEPARIN (PORCINE) IN NACL 1000-0.9 UT/500ML-% IV SOLN
INTRAVENOUS | Status: DC | PRN
  Administered 2021-05-01 (×2): 500 mL

## 2021-05-01 MED ORDER — GUANFACINE HCL 1 MG PO TABS
2.0000 mg | ORAL_TABLET | Freq: Every day | ORAL | Status: DC
  Administered 2021-05-01: 2 mg via ORAL
  Filled 2021-05-01: qty 2

## 2021-05-01 MED ORDER — IOHEXOL 350 MG/ML SOLN
INTRAVENOUS | Status: DC | PRN
  Administered 2021-05-01: 80 mL

## 2021-05-01 MED ORDER — LOSARTAN POTASSIUM 25 MG PO TABS: 25.0000 mg | ORAL_TABLET | Freq: Every evening | ORAL | Status: DC

## 2021-05-01 MED ORDER — HYDRALAZINE HCL 20 MG/ML IJ SOLN
10.0000 mg | INTRAMUSCULAR | Status: AC | PRN
  Administered 2021-05-01: 10 mg via INTRAVENOUS

## 2021-05-01 MED ORDER — SODIUM CHLORIDE 0.9 % IV SOLN: 250.0000 mL | INTRAVENOUS | Status: DC | PRN

## 2021-05-01 MED ORDER — HEPARIN (PORCINE) IN NACL 1000-0.9 UT/500ML-% IV SOLN
INTRAVENOUS | Status: AC
  Filled 2021-05-01: qty 500

## 2021-05-01 MED ORDER — SODIUM CHLORIDE 0.9 % IV SOLN: INTRAVENOUS | Status: AC

## 2021-05-01 MED ORDER — FENTANYL CITRATE (PF) 100 MCG/2ML IJ SOLN
INTRAMUSCULAR | Status: AC
  Filled 2021-05-01: qty 2

## 2021-05-01 MED ORDER — SODIUM CHLORIDE 0.9 % WEIGHT BASED INFUSION: 3.0000 mL/kg/h | INTRAVENOUS | Status: DC

## 2021-05-01 MED ORDER — ACETAMINOPHEN 325 MG PO TABS
650.0000 mg | ORAL_TABLET | ORAL | Status: DC | PRN
  Administered 2021-05-01: 650 mg via ORAL
  Filled 2021-05-01: qty 2

## 2021-05-01 MED ORDER — LIDOCAINE HCL (PF) 1 % IJ SOLN
INTRAMUSCULAR | Status: AC
  Filled 2021-05-01: qty 30

## 2021-05-01 MED ORDER — ISOSORBIDE MONONITRATE ER 30 MG PO TB24
30.0000 mg | ORAL_TABLET | Freq: Every day | ORAL | Status: DC
  Administered 2021-05-01 – 2021-05-02 (×2): 30 mg via ORAL
  Filled 2021-05-01 (×2): qty 1

## 2021-05-01 MED ORDER — SODIUM CHLORIDE 0.9% FLUSH: 3.0000 mL | Freq: Two times a day (BID) | INTRAVENOUS | Status: DC

## 2021-05-01 MED ORDER — ONDANSETRON HCL 4 MG/2ML IJ SOLN: 4.0000 mg | Freq: Four times a day (QID) | INTRAMUSCULAR | Status: DC | PRN

## 2021-05-01 MED ORDER — ASPIRIN 81 MG PO CHEW
81.0000 mg | CHEWABLE_TABLET | ORAL | Status: AC
  Administered 2021-05-01: 81 mg via ORAL

## 2021-05-01 MED ORDER — MIDAZOLAM HCL 2 MG/2ML IJ SOLN
INTRAMUSCULAR | Status: AC
  Filled 2021-05-01: qty 2

## 2021-05-01 MED ORDER — LEVOTHYROXINE SODIUM 100 MCG PO TABS
100.0000 ug | ORAL_TABLET | Freq: Every day | ORAL | Status: DC
  Administered 2021-05-02: 100 ug via ORAL
  Filled 2021-05-01: qty 1

## 2021-05-01 MED ORDER — FENTANYL CITRATE (PF) 100 MCG/2ML IJ SOLN
INTRAMUSCULAR | Status: DC | PRN
  Administered 2021-05-01: 50 ug via INTRAVENOUS

## 2021-05-01 MED ORDER — AMLODIPINE BESYLATE 10 MG PO TABS
10.0000 mg | ORAL_TABLET | Freq: Every day | ORAL | Status: DC
  Administered 2021-05-01: 10 mg via ORAL
  Filled 2021-05-01: qty 1

## 2021-05-01 MED ORDER — CARVEDILOL 12.5 MG PO TABS
12.5000 mg | ORAL_TABLET | Freq: Two times a day (BID) | ORAL | Status: DC
  Administered 2021-05-01 – 2021-05-02 (×2): 12.5 mg via ORAL
  Filled 2021-05-01 (×2): qty 1

## 2021-05-01 MED ORDER — MIDAZOLAM HCL 2 MG/2ML IJ SOLN
INTRAMUSCULAR | Status: DC | PRN
  Administered 2021-05-01: 1 mg via INTRAVENOUS

## 2021-05-01 MED ORDER — ADULT MULTIVITAMIN W/MINERALS CH
1.0000 | ORAL_TABLET | Freq: Every day | ORAL | Status: DC
  Administered 2021-05-02: 1 via ORAL
  Filled 2021-05-01: qty 1

## 2021-05-01 MED ORDER — SODIUM CHLORIDE 0.9 % WEIGHT BASED INFUSION
3.0000 mL/kg/h | INTRAVENOUS | Status: AC
  Administered 2021-05-01: 3 mL/kg/h via INTRAVENOUS

## 2021-05-01 MED ORDER — LABETALOL HCL 5 MG/ML IV SOLN
10.0000 mg | INTRAVENOUS | Status: AC | PRN
Start: 2021-05-01 — End: 2021-05-01

## 2021-05-01 SURGICAL SUPPLY — 13 items
CATH INFINITI 5 FR AR1 MOD (CATHETERS) ×1 IMPLANT
CATH INFINITI 5 FR IM (CATHETERS) ×1 IMPLANT
CATH INFINITI 5 FR LCB (CATHETERS) ×1 IMPLANT
CATH INFINITI 5FR MULTPACK ANG (CATHETERS) ×1 IMPLANT
KIT HEART LEFT (KITS) ×2 IMPLANT
PACK CARDIAC CATHETERIZATION (CUSTOM PROCEDURE TRAY) ×2 IMPLANT
SHEATH PINNACLE 5F 10CM (SHEATH) ×1 IMPLANT
TRANSDUCER W/STOPCOCK (MISCELLANEOUS) ×2 IMPLANT
TUBING CIL FLEX 10 FLL-RA (TUBING) ×2 IMPLANT
WIRE EMERALD 3MM-J .035X150CM (WIRE) ×1 IMPLANT
WIRE EMERALD 3MM-J .035X260CM (WIRE) ×1 IMPLANT
WIRE HI TORQ VERSACORE-J 145CM (WIRE) ×1 IMPLANT
WIRE MICRO SET SILHO 5FR 7 (SHEATH) ×1 IMPLANT

## 2021-05-01 NOTE — Interval H&P Note (Signed)
History and Physical Interval Note:  05/01/2021 4:13 PM  [name]  has presented today for surgery, with the diagnosis of chest pain.  The various methods of treatment have been discussed with the patient and family. After consideration of risks, benefits and other options for treatment, the patient has consented to  Procedure(s): LEFT HEART CATH AND CORONARY ANGIOGRAPHY (N/A) as a surgical intervention.  The patient's history has been reviewed, patient examined, no change in status, stable for surgery.  I have reviewed the patient's chart and labs.  Questions were answered to the patient's satisfaction.    2012 Appropriate Use Criteria for Diagnostic Catheterization CAD Assessment (Coronary Angiography With or Without Left Heart Catheterization and/or Left Ventriculography) Indication:  Patients With Known Obstructive CAD (e.g., Prior MI, Prior PCI, Prior CABG, or Obstructive Disease on Invasive Angiography) Post Revascularization (PCI or CABG) Low-risk noninvasive findings Worsening or limiting symptoms U (6) Indication: 53; Score 6   Baley Lorimer J Lakeasha Petion

## 2021-05-01 NOTE — Progress Notes (Signed)
Site area: right groin  Site Prior to Removal:  Level 0  Pressure Applied For   18  Minutes Beginning at 1810  Manual:   Yes.    Patient Status During Pull:  Stable  Post Pull Groin Site:  Level 0  Post Pull Instructions Given:  Yes.    Post Pull Pulses Present:  Yes.    Dressing Applied:  Yes.    Comments:  Bed rest started at 1810 X 4 hr.

## 2021-05-01 NOTE — Op Note (Signed)
Severe native vessel disease Patent grafts Full report to follow   Nigel Mormon, MD Pager: [phone] Office: [phone]

## 2021-05-01 NOTE — H&P (Signed)
OV 03/20/2021 copied for documentation    Primary Physician/Referring:  Lucianne Lei, MD  Patient ID: [name], female    DOB: May 02, 1941, 80 y.o.   MRN: [id number]  Chief Complaint  Patient presents with   Chest Pain   Follow-up   Hospitalization Follow-up   HPI:    [name]  is a 80 y.o. AA female with history of hyperlipidemia, renovascular hypertension S/P renal stent in 1997, CAD S/P CABG in 1997, PAD with bilateral PTFE covered iliac stents in 2009, mild abdominal aortic ectasia noted on CT scan of the abdomen, PAD, sleep apnea but has not been able to tolerate CPAP.  Patient denies history of CKD or diabetes.  Patient presents for urgent visit as she was evaluated in emergency department yesterday with chest pain, left arm pain, and shortness of breath.Patient underwent recent echocardiogram and nuclear stress test in 10/2020 which were reassuring.  However patient states yesterday she developed substernal chest pain around 10 PM which was constant and radiating to the left arm with associated shortness of breath.  Presentation in the emergency department revealed EKG without acute ischemic changes, serial troponins negative.  However patient's chest pain was suggestive of angina pectoris and responsive to nitroglycerin.  Patient was discharged with Imdur 30 mg daily and advised for close follow-up.  Patient now presents for follow-up.  She states chest pain has improved since yesterday although she still has brief intermittent episodes.  She has not started Imdur.  Past Medical History:  Diagnosis Date   Breast cancer ([place])    s/p mastectomy   CAD (coronary artery disease)    s/p CABG   DDD (degenerative disc disease), lumbar    Discoid lupus    Dyspnea    GERD (gastroesophageal reflux disease)    Gout    HLD (hyperlipidemia)    HTN (hypertension)    Hypothyroidism    Low back pain    Obesity    OSA (obstructive sleep apnea)    did not tolerate CPAP    Osteoporosis    PVD (peripheral vascular disease) ([place])    Renal disorder    Past Surgical History:  Procedure Laterality Date   ANKLE SURGERY  7/05   left   CORONARY ANGIOPLASTY WITH STENT PLACEMENT  2003   7 total stents placed   CORONARY ARTERY BYPASS GRAFT  1997   ELBOW SURGERY     right   hysterectomy - unknown type     MASTECTOMY  1985   right   OOPHORECTOMY     RENAL ARTERY STENT     Family History  Problem Relation Age of Onset   Coronary artery disease Other    Diabetes Other    Hypertension Other    Aneurysm Other    Stroke Other    Aneurysm Mother    Alzheimer's disease Father    Hypertension Sister    Hyperlipidemia Sister    Suicidality Brother    Hypertension Sister    Hyperlipidemia Sister    Hypertension Sister    Hyperlipidemia Sister    Drug abuse Son     Social History   Tobacco Use   Smoking status: Former    Types: Cigarettes    Quit date: 07/23/1995    Years since quitting: 25.6   Smokeless tobacco: Never  Substance Use Topics   Alcohol use: No   Marital Status: Widowed  ROS  Review of Systems  Cardiovascular:  Positive for chest pain and dyspnea on  exertion. Negative for claudication, leg swelling, near-syncope, orthopnea, palpitations, paroxysmal nocturnal dyspnea and syncope.  Objective  Blood pressure (!) 147/68, pulse 60, temperature 97.9 F (36.6 C), height _0  (1.626 m), weight 195 lb (88.5 kg), SpO2 99 %.  Vitals with BMI 03/20/2021 03/20/2021 03/20/2021  Height _1  - -  Weight 195 lbs - -  BMI 16.57 - -  Systolic 903 833 383  Diastolic 68 56 56  Pulse 60 62 53     Physical Exam Vitals reviewed.  Cardiovascular:     Rate and Rhythm: Normal rate and regular rhythm.     Pulses: Intact distal pulses.          Carotid pulses are 2+ on the right side and 2+ on the left side.      Radial pulses are 2+ on the right side and 2+ on the left side.       Femoral pulses are 1+ on the right side with bruit and 2+ on the left side  with bruit.      Popliteal pulses are 0 on the right side and 0 on the left side.       Dorsalis pedis pulses are 0 on the right side and 0 on the left side.       Posterior tibial pulses are 1+ on the right side and 1+ on the left side.     Heart sounds: Normal heart sounds. No murmur heard.   No gallop.  Pulmonary:     Effort: Pulmonary effort is normal. No respiratory distress.     Breath sounds: No wheezing, rhonchi or rales.  Musculoskeletal:     Right lower leg: No edema.     Left lower leg: No edema.  Skin:    General: Skin is warm and dry.  Neurological:     General: No focal deficit present.     Mental Status: She is alert and oriented to person, place, and time.   Laboratory examination:   CMP Latest Ref Rng & Units 03/20/2021 10/18/2018 03/25/2016  Glucose 70 - 99 mg/dL 106(H) 98 91  BUN 8 - 23 mg/dL _2 Creatinine 0.44 - 1.00 mg/dL 1.33(H) 1.05(H) 0.93  Sodium 135 - 145 mmol/L 139 139 138  Potassium 3.5 - 5.1 mmol/L 3.2(L) 4.4 3.9  Chloride 98 - 111 mmol/L 103 110 112(H)  CO2 22 - 32 mmol/L 26 24 20(L)  Calcium 8.9 - 10.3 mg/dL 10.4(H) 9.8 8.9  Total Protein 6.5 - 8.1 g/dL - - -  Total Bilirubin 0.3 - 1.2 mg/dL - - -  Alkaline Phos 38 - 126 U/L - - -  AST 15 - 41 U/L - - -  ALT 14 - 54 U/L - - -   CBC Latest Ref Rng & Units 03/20/2021 10/18/2018 02/21/2016  WBC 4.0 - 10.5 K/uL 8.9 6.0 5.5  Hemoglobin 12.0 - 15.0 g/dL 12.2 11.8(L) 12.7  Hematocrit 36.0 - 46.0 % 37.3 38.5 39.2  Platelets 150 - 400 K/uL 196 198 202   Lipid Panel     Component Value Date/Time   CHOL 107 12/10/2010 0910   TRIG 93.0 12/10/2010 0910   HDL 37.40 (L) 12/10/2010 0910   CHOLHDL 3 12/10/2010 0910   VLDL 18.6 12/10/2010 0910   LDLCALC 51 12/10/2010 0910   HEMOGLOBIN A1C No results found for: HGBA1C, MPG TSH No results for input(s): TSH in the last 8760 hours.  Troponin 03/19/2021: 18 --> 17  External labs:  09/19/2020:  Total cholesterol 112, triglycerides 131, HDL 33, LDL  58.  Serum glucose 95 mg, BUN 18, creatinine 1.13, EGFR 53 mL, potassium 3.7, sodium 141.  A1c 6.0%.  TSH markedly reduced at 0.02.  Normal T3 and T4.  Hb 12.6/HCT 38.5, platelets 230.  Allergies   Allergies  Allergen Reactions   Amoxicillin Swelling   Clarithromycin Swelling    Medications Prior to Visit:   Outpatient Medications Prior to Visit  Medication Sig Dispense Refill   amLODipine (NORVASC) 10 MG tablet Take 10 mg by mouth at bedtime.     aspirin 81 MG tablet Take 1 tablet (81 mg total) by mouth daily. 30 tablet 6   atorvastatin (LIPITOR) 40 MG tablet Take 40 mg by mouth daily.     carvedilol (COREG) 12.5 MG tablet Take 1 tablet (12.5 mg total) by mouth 2 (two) times daily. 180 tablet 3   diclofenac Sodium (VOLTAREN) 1 % GEL SMARTSIG:2 Gel Topical 4 Times Daily     guanFACINE (TENEX) 2 MG tablet Take 2 mg by mouth at bedtime.     indapamide (LOZOL) 1.25 MG tablet Take 1.25 mg by mouth every morning.     levothyroxine (SYNTHROID) 100 MCG tablet Take 100 mcg by mouth daily.     losartan (COZAAR) 25 MG tablet Take 1 tablet (25 mg total) by mouth every evening. 90 tablet 3   multivitamin-iron-minerals-folic acid (CENTRUM) chewable tablet Chew 1 tablet by mouth daily.     isosorbide mononitrate (IMDUR) 30 MG 24 hr tablet Take 1 tablet (30 mg total) by mouth daily. Do not take if top blood pressure number (systolic blood pressure) is less than 100. (Patient not taking: Reported on 03/20/2021) 30 tablet 0   No facility-administered medications prior to visit.   Final Medications at End of Visit    Current Meds  Medication Sig   amLODipine (NORVASC) 10 MG tablet Take 10 mg by mouth at bedtime.   aspirin 81 MG tablet Take 1 tablet (81 mg total) by mouth daily.   atorvastatin (LIPITOR) 40 MG tablet Take 40 mg by mouth daily.   carvedilol (COREG) 12.5 MG tablet Take 1 tablet (12.5 mg total) by mouth 2 (two) times daily.   diclofenac Sodium (VOLTAREN) 1 % GEL SMARTSIG:2 Gel  Topical 4 Times Daily   guanFACINE (TENEX) 2 MG tablet Take 2 mg by mouth at bedtime.   indapamide (LOZOL) 1.25 MG tablet Take 1.25 mg by mouth every morning.   levothyroxine (SYNTHROID) 100 MCG tablet Take 100 mcg by mouth daily.   losartan (COZAAR) 25 MG tablet Take 1 tablet (25 mg total) by mouth every evening.   multivitamin-iron-minerals-folic acid (CENTRUM) chewable tablet Chew 1 tablet by mouth daily.   Radiology:   Chest x-ray PA and lateral view 10/18/2018: The heart size borderline. The hila and mediastinum are unremarkable. No pneumothorax. No nodules or masses. No focal infiltrates.  Cardiac Studies:   Coronary Artery Bypass Graft   [1997]: LIMA TO LAD; SVG TO PD AND PL , SVG TO OM. CARDIAC CATH 2003 PATENT GRAFTS.  Cardiac cath 2003 patent grafts   CABG 1997: LIMA TO LAD; SVG TO PD AND PL , SVG TO OM. CARDIAC CATH 2003 PATENT GRAFTS.  Bilateral iliac stents 2009: H/O PTFE covered- Bilateral iliac stent 2009 for instent restenosis.  Echocardiogram 10/27/2020: Normal LV systolic function with visual EF 55-60%. Left ventricle cavity is normal in size. The left ventricle is normal thickness Presence of a septal bulge. Normal global wall motion. Indeterminate  diastolic filling pattern, normal LAP.  Left atrial cavity is mildly dilated. Mild (Grade I) aortic regurgitation. Mild (Grade I) mitral regurgitation. Mild tricuspid regurgitation. No evidence of pulmonary hypertension. RVSP measures 34 mmHg. Mild pulmonic regurgitation. Compared to prior study dated 10/25/2013: No significant change.   Lower Extremity Arterial Duplex 10/27/2020: No hemodynamically significant stenoses are identified in the right and left lower extremity arterial system.  Bilateral profunda femoral arteries show <50% stenosis with severely abnormal monophasic waveform. There is mild diffuse heteregeneous plaque noted through out the bilateral lower extremities. This exam reveals mildly decreased  perfusion of the right lower extremity, noted at the post tibial artery level (ABI 0.84) and mildly decreased perfusion of the left lower extremity, noted at the post tibial artery level (ABI 0.84) with mildly abnormal biphasic waveform at the level of the ankles.   Lexiscan Tetrofosmin Stress Test  10/11/2020: Nondiagnostic ECG stress. Myocardial perfusion is abnormal with severe soft tissue attenuation in the infero-lateral wall. mild ischemia cannot be excluded, but appears less likely given normal wall motion in stress images. Overall LV systolic function is normal . Stress LV EF: 58%.  No previous exam available for comparison. Low risk.   Abdominal Aortic Duplex 10/27/2020: Moderate dilatation of the abdominal aorta is noted in the proximal aorta. An abdominal aortic aneurysm measuring 3.03 x 3.11 x 3.06 cm is seen. Mild ectasia also noted in mid aorta. There is mild diffuse heterogeneous plaque noted.  Left external iliac artery stenosis of <50%.  Compared to 05/29/2018,there is minimal progression of the aneurysm size form 2.77 cm. Recheck in 3 years.    EKG:   03/20/2021: Sinus bradycardia at a rate of 56 bpm.  Left axis, left anterior fascicular block.  Right bundle branch block.  LVH with secondary ST-T wave changes.  Bradycardia EKG 10/09/2020, no significant change.  EKG 10/09/2020: Sinus bradycardia at a rate of 59 bpm. Left axis, left anterior fascicular block.  Right bundle branch block.  Left ventricular hypertrophy with secondary ST-T wave abnormalities, cannot exclude ischemia.  Assessment     ICD-10-CM   1. Angina pectoris ([place])  I20.9 EKG 12-Lead    2. Coronary artery disease involving coronary bypass graft of native heart without angina pectoris  I25.810     3. Dyspnea on exertion  R06.00        There are no discontinued medications.   No orders of the defined types were placed in this encounter.  Orders Placed This Encounter  Procedures   EKG 12-Lead     Recommendations:   [name] is a 80 y.o. AA female with history of hyperlipidemia, renovascular hypertension S/P renal stent in 1997, CAD S/P CABG in 1997, PAD with bilateral PTFE covered iliac stents in 2009, mild abdominal aortic ectasia noted on CT scan of the abdomen, PAD, sleep apnea but has not been able to tolerate CPAP.  Patient presents for urgent visit following emergency department evaluation for chest pain and dyspnea yesterday.  Patient's symptoms suggestive of angina pectoris and responsive to nitroglycerin.  Advised patient to start Imdur 30 mg daily, which will also improve blood pressure control.  Patient has had recent echocardiogram and nuclear stress testing, which were reassuring.  However given her history of CAD s/p CABG would recommend further evaluation with left heart catheterization.  Counseled patient regarding signs and symptoms that would warrant urgent or emergent evaluation.  The left heart catheterization procedure was explained to the patient in detail. The indication, alternatives, risks and  benefits were reviewed. Complications including but not limited to bleeding, infection, acute kidney injury, blood transfusion, heart rhythm disturbances, contrast (dye) reaction, damage to the arteries or nerves in the legs or hands, cerebrovascular accident, myocardial infarction, need for emergent bypass surgery, blood clots in the legs, possible need for emergent blood transfusion, and rarely death were reviewed and discussed with the patient. The patient voices understanding and wishes to proceed.  Follow-up after cardiac catheterization.  Patient was seen in collaboration with Dr. Einar Gip and he is in agreement with the plan.     Alethia Berthold, PA-C 03/20/2021, 11:02 AM Office: [phone]

## 2021-05-02 ENCOUNTER — Encounter (HOSPITAL_COMMUNITY): Payer: Self-pay | Admitting: Cardiology

## 2021-05-02 DIAGNOSIS — Z951 Presence of aortocoronary bypass graft: Secondary | ICD-10-CM | POA: Diagnosis not present

## 2021-05-02 DIAGNOSIS — R0600 Dyspnea, unspecified: Secondary | ICD-10-CM | POA: Diagnosis not present

## 2021-05-02 DIAGNOSIS — E785 Hyperlipidemia, unspecified: Secondary | ICD-10-CM | POA: Diagnosis not present

## 2021-05-02 DIAGNOSIS — I739 Peripheral vascular disease, unspecified: Secondary | ICD-10-CM | POA: Diagnosis not present

## 2021-05-02 DIAGNOSIS — G4733 Obstructive sleep apnea (adult) (pediatric): Secondary | ICD-10-CM | POA: Diagnosis not present

## 2021-05-02 DIAGNOSIS — Z881 Allergy status to other antibiotic agents status: Secondary | ICD-10-CM | POA: Diagnosis not present

## 2021-05-02 DIAGNOSIS — Z7982 Long term (current) use of aspirin: Secondary | ICD-10-CM | POA: Diagnosis not present

## 2021-05-02 DIAGNOSIS — Z79899 Other long term (current) drug therapy: Secondary | ICD-10-CM | POA: Diagnosis not present

## 2021-05-02 DIAGNOSIS — I25118 Atherosclerotic heart disease of native coronary artery with other forms of angina pectoris: Secondary | ICD-10-CM | POA: Diagnosis not present

## 2021-05-02 DIAGNOSIS — Z87891 Personal history of nicotine dependence: Secondary | ICD-10-CM | POA: Diagnosis not present

## 2021-05-02 DIAGNOSIS — I209 Angina pectoris, unspecified: Secondary | ICD-10-CM | POA: Diagnosis not present

## 2021-05-02 DIAGNOSIS — I10 Essential (primary) hypertension: Secondary | ICD-10-CM | POA: Diagnosis not present

## 2021-05-02 NOTE — Discharge Summary (Signed)
Physician Discharge Summary  Patient ID: [name] MRN: [id number] DOB/AGE: September 04, 1940 80 y.o.  Admit date: 05/01/2021 Discharge date: 05/02/2021  Primary Discharge Diagnosis: Coronary artery disease Peripheral artery disease  Secondary Discharge Diagnosis: Hypertension Hyperlipidemia Obstructive sleep apnea   Hospital Course:   80 y/o [place] American female with hypertension, hyperlipidemia, CAD s/p CABGX4 (LIMA-LAD, SVG-PDA/PL, SVG-OM-1997), PAD s/p b/l iliac stents 2009, OSA, now with recurrent chest pain and dyspnea.   While stress test was reassuring, patient has continued to have recurrent symptoms. Given her high pre-test probability, coronary and bypass graft angiography was recommended   Discharge Exam: Blood pressure 120/61, pulse (!) 53, temperature 98.7 F (37.1 C), temperature source Oral, resp. rate 17, height 5\' 5"  (1.651 m), weight 87.1 kg, SpO2 96 %.   Physical Exam Vitals and nursing note reviewed.  Constitutional:      General: She is not in acute distress.    Appearance: She is well-developed.  HENT:     Head: Normocephalic and atraumatic.  Eyes:     Conjunctiva/sclera: Conjunctivae normal.     Pupils: Pupils are equal, round, and reactive to light.  Neck:     Vascular: No JVD.  Cardiovascular:     Rate and Rhythm: Normal rate and regular rhythm.     Pulses: Normal pulses and intact distal pulses.     Heart sounds: No murmur heard. Pulmonary:     Effort: Pulmonary effort is normal.     Breath sounds: Normal breath sounds. No wheezing or rales.  Abdominal:     General: Bowel sounds are normal.     Palpations: Abdomen is soft.     Tenderness: There is no rebound.  Musculoskeletal:        General: No tenderness. Normal range of motion.     Right lower leg: No edema.  Lymphadenopathy:     Cervical: No cervical adenopathy.  Skin:    General: Skin is warm and dry.  Neurological:     Mental Status: She is alert and oriented to person,  place, and time.     Cranial Nerves: No cranial nerve deficit.     Recommendations on discharge:   Continue OMT  Significant Diagnostic Studies:  EKG 05/01/2021: Sinus bradycardia 47 bpm RBBB LAFB Old lateral infarct  Coronary and bypass graft angiography 05/01/2021: LM: Normal LAD: Intact antegrade flow with severe proximal 80% stenosis. Lcx: Patent circumflex, with occluded OM vessels.  1 vessels bypassed by SVG  RCA: Proximal 80% stenosis followed by occlusion.  PDA/PL bypassed by SVG. LIMA-LAD: It appears that the LIMA graft is touching do not add LAD bifurcation, with predominant flow going down the bag.  Very minimal flow going down the distal LAD from the LIMA. SVG-PDA/PL/OM2 (Y graft): Patent. No significant disease   Of note, narrowing noted inside right iliac stent.  Difficulty advancing J-wire, but was able to advance Versacore and catheters through this.   Severe native vessel disease with patent grafts, as detailed above. It is possible that patient has an area of ischemia in her proximal LAD territory, as it does not get significant retrograde flow from the LIMA graft.  The patient continues to be symptomatic on optimal medical therapy, we could consider PCI to her proximal LAD.    Labs:   Lab Results  Component Value Date   WBC 6.4 05/01/2021   HGB 12.2 05/01/2021   HCT 38.6 05/01/2021   MCV 88.7 05/01/2021   PLT 205 05/01/2021    Recent Labs  Lab 05/01/21 1222  NA 137  K 4.1  CL 103  CO2 25  BUN 19  CREATININE 1.30*  CALCIUM 10.1  GLUCOSE 114*    Lipid Panel     Component Value Date/Time   CHOL 107 12/10/2010 0910   TRIG 93.0 12/10/2010 0910   HDL 37.40 (L) 12/10/2010 0910   CHOLHDL 3 12/10/2010 0910   VLDL 18.6 12/10/2010 0910   LDLCALC 51 12/10/2010 0910    BNP (last 3 results) No results for input(s): BNP in the last 8760 hours.  HEMOGLOBIN A1C No results found for: HGBA1C, MPG  Cardiac Panel (last 3 results) No results for  input(s): CKTOTAL, CKMB, TROPONINI, RELINDX in the last 8760 hours.  Lab Results  Component Value Date   CKTOTAL 116 12/14/2008   CKMB 3.4 12/14/2008   TROPONINI <0.03 10/18/2018     TSH No results for input(s): TSH in the last 8760 hours.  Radiology: CARDIAC CATHETERIZATION  Result Date: 05/02/2021   Prox LAD to Mid LAD lesion is 80% stenosed. LM: Normal LAD: Intact antegrade flow with severe proximal 80% stenosis. Lcx: Patent circumflex, with occluded OM vessels.  1 vessels bypassed by SVG RCA: Proximal 80% stenosis followed by occlusion.  PDA/PL bypassed by SVG. LIMA-LAD: It appears that the LIMA graft is touching do not add LAD bifurcation, with predominant flow going down the bag.  Very minimal flow going down the distal LAD from the LIMA. SVG-PDA/PL/OM2 (Y graft): Patent. No significant disease Of note, narrowing noted inside right iliac stent.  Difficulty advancing J-wire, but was able to advance Versacore and catheters through this. Severe native vessel disease with patent grafts, as detailed above. It is possible that patient has an area of ischemia in her proximal LAD territory, as it does not get significant retrograde flow from the LIMA graft.  The patient continues to be symptomatic on optimal medical therapy, we could consider PCI to her proximal LAD.     FOLLOW UP PLANS AND APPOINTMENTS Discharge Instructions     Diet - low sodium heart healthy   Complete by: As directed    Increase activity slowly   Complete by: As directed       Allergies as of 05/02/2021       Reactions   Amoxicillin Swelling   Clarithromycin Swelling        Medication List     TAKE these medications    amLODipine 10 MG tablet Commonly known as: NORVASC Take 10 mg by mouth at bedtime.   aspirin 81 MG tablet Take 1 tablet (81 mg total) by mouth daily.   atorvastatin 40 MG tablet Commonly known as: LIPITOR Take 40 mg by mouth daily.   carvedilol 12.5 MG tablet Commonly known as:  COREG Take 1 tablet (12.5 mg total) by mouth 2 (two) times daily.   diclofenac Sodium 1 % Gel Commonly known as: VOLTAREN SMARTSIG:2 Gel Topical 4 Times Daily   guanFACINE 2 MG tablet Commonly known as: TENEX Take 2 mg by mouth at bedtime.   indapamide 1.25 MG tablet Commonly known as: LOZOL Take 1.25 mg by mouth every morning.   isosorbide mononitrate 30 MG 24 hr tablet Commonly known as: IMDUR Take 1 tablet (30 mg total) by mouth daily. Do not take if top blood pressure number (systolic blood pressure) is less than 100.   levothyroxine 100 MCG tablet Commonly known as: SYNTHROID Take 100 mcg by mouth daily.   losartan 25 MG tablet Commonly known as: COZAAR Take 1  tablet (25 mg total) by mouth every evening.   multivitamin-iron-minerals-folic acid chewable tablet Chew 1 tablet by mouth daily.           Nigel Mormon, MD Pager: [phone] Office: [phone]

## 2021-05-02 NOTE — Plan of Care (Signed)

## 2021-05-02 NOTE — Progress Notes (Signed)
Patient given discharge instructions and stated understanding. 

## 2021-05-06 ENCOUNTER — Other Ambulatory Visit: Payer: Self-pay

## 2021-05-06 ENCOUNTER — Encounter (HOSPITAL_BASED_OUTPATIENT_CLINIC_OR_DEPARTMENT_OTHER): Payer: Self-pay

## 2021-05-06 ENCOUNTER — Emergency Department (HOSPITAL_BASED_OUTPATIENT_CLINIC_OR_DEPARTMENT_OTHER): Payer: Medicare Other

## 2021-05-06 ENCOUNTER — Emergency Department (HOSPITAL_BASED_OUTPATIENT_CLINIC_OR_DEPARTMENT_OTHER)
Admission: EM | Admit: 2021-05-06 | Discharge: 2021-05-06 | Disposition: A | Discharge status: Home / Self Care | Payer: Medicare Other | Attending: Emergency Medicine | Admitting: Emergency Medicine

## 2021-05-06 DIAGNOSIS — Z853 Personal history of malignant neoplasm of breast: Secondary | ICD-10-CM | POA: Diagnosis not present

## 2021-05-06 DIAGNOSIS — Z951 Presence of aortocoronary bypass graft: Secondary | ICD-10-CM | POA: Insufficient documentation

## 2021-05-06 DIAGNOSIS — I6522 Occlusion and stenosis of left carotid artery: Secondary | ICD-10-CM

## 2021-05-06 DIAGNOSIS — I10 Essential (primary) hypertension: Secondary | ICD-10-CM | POA: Insufficient documentation

## 2021-05-06 DIAGNOSIS — Z87891 Personal history of nicotine dependence: Secondary | ICD-10-CM | POA: Insufficient documentation

## 2021-05-06 DIAGNOSIS — E876 Hypokalemia: Secondary | ICD-10-CM

## 2021-05-06 DIAGNOSIS — Z79899 Other long term (current) drug therapy: Secondary | ICD-10-CM | POA: Diagnosis not present

## 2021-05-06 DIAGNOSIS — H81391 Other peripheral vertigo, right ear: Secondary | ICD-10-CM | POA: Diagnosis not present

## 2021-05-06 DIAGNOSIS — R110 Nausea: Secondary | ICD-10-CM | POA: Diagnosis not present

## 2021-05-06 DIAGNOSIS — I2510 Atherosclerotic heart disease of native coronary artery without angina pectoris: Secondary | ICD-10-CM | POA: Diagnosis not present

## 2021-05-06 DIAGNOSIS — E039 Hypothyroidism, unspecified: Secondary | ICD-10-CM | POA: Diagnosis not present

## 2021-05-06 DIAGNOSIS — Z20822 Contact with and (suspected) exposure to covid-19: Secondary | ICD-10-CM | POA: Insufficient documentation

## 2021-05-06 DIAGNOSIS — R001 Bradycardia, unspecified: Secondary | ICD-10-CM | POA: Diagnosis not present

## 2021-05-06 DIAGNOSIS — R42 Dizziness and giddiness: Secondary | ICD-10-CM | POA: Diagnosis not present

## 2021-05-06 LAB — COMPREHENSIVE METABOLIC PANEL
ALT: 20 U/L (ref 0–44)
AST: 29 U/L (ref 15–41)
Albumin: 3.9 g/dL (ref 3.5–5.0)
Alkaline Phosphatase: 75 U/L (ref 38–126)
Anion gap: 8 (ref 5–15)
BUN: 24 mg/dL — ABNORMAL HIGH (ref 8–23)
CO2: 28 mmol/L (ref 22–32)
Calcium: 10.5 mg/dL — ABNORMAL HIGH (ref 8.9–10.3)
Chloride: 103 mmol/L (ref 98–111)
Creatinine, Ser: 1.28 mg/dL — ABNORMAL HIGH (ref 0.44–1.00)
GFR, Estimated: 42 mL/min — ABNORMAL LOW (ref >60)
Glucose, Bld: 111 mg/dL — ABNORMAL HIGH (ref 70–99)
Potassium: 2.9 mmol/L — ABNORMAL LOW (ref 3.5–5.1)
Sodium: 139 mmol/L (ref 135–145)
Total Bilirubin: 1.1 mg/dL (ref 0.3–1.2)
Total Protein: 7.4 g/dL (ref 6.5–8.1)

## 2021-05-06 LAB — CBC WITH DIFFERENTIAL/PLATELET
Abs Immature Granulocytes: 0.03 10*3/uL (ref 0.00–0.07)
Basophils Absolute: 0.1 10*3/uL (ref 0.0–0.1)
Basophils Relative: 1 %
Eosinophils Absolute: 0.2 10*3/uL (ref 0.0–0.5)
Eosinophils Relative: 3 %
HCT: 36.9 % (ref 36.0–46.0)
Hemoglobin: 12.1 g/dL (ref 12.0–15.0)
Immature Granulocytes: 0 %
Lymphocytes Relative: 30 %
Lymphs Abs: 2.2 10*3/uL (ref 0.7–4.0)
MCH: 28.2 pg (ref 26.0–34.0)
MCHC: 32.8 g/dL (ref 30.0–36.0)
MCV: 86 fL (ref 80.0–100.0)
Monocytes Absolute: 1 10*3/uL (ref 0.1–1.0)
Monocytes Relative: 14 %
Neutro Abs: 3.7 10*3/uL (ref 1.7–7.7)
Neutrophils Relative %: 52 %
Platelets: 198 10*3/uL (ref 150–400)
RBC: 4.29 MIL/uL (ref 3.87–5.11)
RDW: 14.9 % (ref 11.5–15.5)
WBC: 7.1 10*3/uL (ref 4.0–10.5)
nRBC: 0 % (ref 0.0–0.2)

## 2021-05-06 LAB — TROPONIN I (HIGH SENSITIVITY)
Troponin I (High Sensitivity): 22 ng/L — ABNORMAL HIGH (ref <18)
Troponin I (High Sensitivity): 21 ng/L — ABNORMAL HIGH (ref <18)

## 2021-05-06 LAB — RESP PANEL BY RT-PCR (FLU A&B, COVID) ARPGX2
Influenza A by PCR: NEGATIVE
Influenza B by PCR: NEGATIVE
SARS Coronavirus 2 by RT PCR: NEGATIVE

## 2021-05-06 MED ORDER — MECLIZINE HCL 25 MG PO TABS
25.0000 mg | ORAL_TABLET | Freq: Once | ORAL | Status: AC
  Administered 2021-05-06: 25 mg via ORAL
  Filled 2021-05-06: qty 1

## 2021-05-06 MED ORDER — ONDANSETRON HCL 4 MG/2ML IJ SOLN
4.0000 mg | Freq: Once | INTRAMUSCULAR | Status: AC
  Administered 2021-05-06: 4 mg via INTRAVENOUS
  Filled 2021-05-06: qty 2

## 2021-05-06 MED ORDER — KETOROLAC TROMETHAMINE 30 MG/ML IJ SOLN
30.0000 mg | Freq: Once | INTRAMUSCULAR | Status: AC
  Administered 2021-05-06: 30 mg via INTRAVENOUS
  Filled 2021-05-06: qty 1

## 2021-05-06 MED ORDER — MECLIZINE HCL 12.5 MG PO TABS: 12.5000 mg | ORAL_TABLET | Freq: Three times a day (TID) | ORAL | 0 refills | Status: DC | PRN

## 2021-05-06 MED ORDER — IOHEXOL 350 MG/ML SOLN
100.0000 mL | Freq: Once | INTRAVENOUS | Status: AC | PRN
  Administered 2021-05-06: 75 mL via INTRAVENOUS

## 2021-05-06 MED ORDER — POTASSIUM CHLORIDE CRYS ER 20 MEQ PO TBCR
60.0000 meq | EXTENDED_RELEASE_TABLET | ORAL | Status: AC
  Administered 2021-05-06: 60 meq via ORAL
  Filled 2021-05-06: qty 3

## 2021-05-06 NOTE — ED Provider Notes (Signed)
[place] EMERGENCY DEPT Provider Note   CSN: [id number] Arrival date & time: 05/06/21  0105     History Chief Complaint  Patient presents with   Dizziness   Nausea    [name] is a 80 y.o. female.  The history is provided by the patient.  Dizziness Quality:  Head spinning Severity:  Severe Onset quality:  Sudden Duration:  3 hours Timing:  Constant Progression:  Unchanged Chronicity:  New Context: eye movement and head movement   Context: not with ear pain, not with loss of consciousness and not when standing up   Relieved by:  Nothing Worsened by:  Nothing Ineffective treatments:  None tried Associated symptoms: nausea   Associated symptoms: no blood in stool, no chest pain, no diarrhea, no headaches, no hearing loss, no palpitations, no shortness of breath, no syncope, no vision changes and no weakness   Risk factors: no hx of vertigo and no Meniere's disease   No CP, no SOB.  No weakness.  No changes in vision or speech.      Past Medical History:  Diagnosis Date   Breast cancer [facility])    s/p mastectomy   CAD (coronary artery disease)    s/p CABG   DDD (degenerative disc disease), lumbar    Discoid lupus    Dyspnea    GERD (gastroesophageal reflux disease)    Gout    HLD (hyperlipidemia)    HTN (hypertension)    Hypothyroidism    Low back pain    Obesity    OSA (obstructive sleep apnea)    did not tolerate CPAP   Osteoporosis    PVD (peripheral vascular disease) ([place])    Renal disorder     Patient Active Problem List   Diagnosis Date Noted   CAD (coronary artery disease) 05/01/2021   Angina pectoris ([place]) 04/30/2021   ARF (acute renal failure) ([place]) 04/10/2013   Hyperkalemia 04/10/2013   Hypotension, unspecified 04/10/2013   Sinus bradycardia 04/10/2013   DYSPNEA 01/03/2010   LUPUS ERYTHEMATOSUS, DISCOID 06/12/2007   HYPOTHYROIDISM 06/11/2007   HYPERLIPIDEMIA 06/11/2007   GOUT 06/11/2007   HYPERTENSION 06/11/2007    CORONARY ARTERY DISEASE 06/11/2007   PERIPHERAL VASCULAR DISEASE 06/11/2007   GERD 06/11/2007   LOW BACK PAIN 06/11/2007   OSTEOPOROSIS 06/11/2007   BREAST CANCER, HX OF 06/11/2007    Past Surgical History:  Procedure Laterality Date   ANKLE SURGERY  7/05   left   CORONARY ANGIOPLASTY WITH STENT PLACEMENT  2003   7 total stents placed   CORONARY ARTERY BYPASS GRAFT  1997   ELBOW SURGERY     right   hysterectomy - unknown type     LEFT HEART CATH AND CORONARY ANGIOGRAPHY N/A 05/01/2021   Procedure: LEFT HEART CATH AND CORONARY ANGIOGRAPHY;  Surgeon: Nigel Mormon, MD;  Location: [facility];  Service: Cardiovascular;  Laterality: N/A;   MASTECTOMY  1985   right   OOPHORECTOMY     RENAL ARTERY STENT       OB History   No obstetric history on file.     Family History  Problem Relation Age of Onset   Coronary artery disease Other    Diabetes Other    Hypertension Other    Aneurysm Other    Stroke Other    Aneurysm Mother    Alzheimer's disease Father    Hypertension Sister    Hyperlipidemia Sister    Suicidality Brother    Hypertension Sister  Hyperlipidemia Sister    Hypertension Sister    Hyperlipidemia Sister    Drug abuse Son     Social History   Tobacco Use   Smoking status: Former    Types: Cigarettes    Quit date: 07/23/1995    Years since quitting: 25.8   Smokeless tobacco: Never  Vaping Use   Vaping Use: Never used  Substance Use Topics   Alcohol use: No   Drug use: No    Home Medications Prior to Admission medications   Medication Sig Start Date End Date Taking? Authorizing Provider  meclizine (ANTIVERT) 12.5 MG tablet Take 1 tablet (12.5 mg total) by mouth 3 (three) times daily as needed for dizziness. 05/06/21  Yes Jourdin Connors, MD  amLODipine (NORVASC) 10 MG tablet Take 10 mg by mouth at bedtime. 09/05/20   [provider]  aspirin 81 MG tablet Take 1 tablet (81 mg total) by mouth daily. 12/10/10   Larey Dresser,  MD  atorvastatin (LIPITOR) 40 MG tablet Take 40 mg by mouth daily.    [provider]  carvedilol (COREG) 12.5 MG tablet Take 1 tablet (12.5 mg total) by mouth 2 (two) times daily. 10/09/20   Cantwell, Celeste C, PA-C  diclofenac Sodium (VOLTAREN) 1 % GEL SMARTSIG:2 Gel Topical 4 Times Daily 02/22/21   [provider]  guanFACINE (TENEX) 2 MG tablet Take 2 mg by mouth at bedtime.    [provider]  indapamide (LOZOL) 1.25 MG tablet Take 1.25 mg by mouth every morning. 08/16/20   [provider]  isosorbide mononitrate (IMDUR) 30 MG 24 hr tablet Take 1 tablet (30 mg total) by mouth daily. Do not take if top blood pressure number (systolic blood pressure) is less than 100. Patient not taking: Reported on 04/26/2021 03/20/21 04/26/21  Fatima Blank, MD  levothyroxine (SYNTHROID) 100 MCG tablet Take 100 mcg by mouth daily.    [provider]  losartan (COZAAR) 25 MG tablet Take 1 tablet (25 mg total) by mouth every evening. 11/10/20 11/05/21  Adrian Prows, MD  multivitamin-iron-minerals-folic acid (CENTRUM) chewable tablet Chew 1 tablet by mouth daily.    [provider]    Allergies    Amoxicillin and Clarithromycin  Review of Systems   Review of Systems  Constitutional:  Negative for fever.  HENT:  Negative for hearing loss.   Eyes:  Negative for redness and visual disturbance.  Respiratory:  Negative for shortness of breath.   Cardiovascular:  Negative for chest pain, palpitations and syncope.  Gastrointestinal:  Positive for nausea. Negative for blood in stool and diarrhea.  Genitourinary:  Negative for difficulty urinating.  Musculoskeletal:  Negative for neck pain and neck stiffness.  Skin:  Negative for rash.  Neurological:  Positive for dizziness. Negative for tremors, seizures, syncope, facial asymmetry, speech difficulty, weakness, light-headedness, numbness and headaches.  Psychiatric/Behavioral:  Negative for agitation.   All  other systems reviewed and are negative.  Physical Exam Updated Vital Signs BP (!) 145/84   Pulse (!) 55   Temp 98.2 F (36.8 C) (Oral)   Resp 19   Ht 5\' 5"  (1.651 m)   Wt 86.6 kg   SpO2 96%   BMI 31.78 kg/m   Physical Exam Vitals and nursing note reviewed.  Constitutional:      Appearance: Normal appearance.  HENT:     Head: Normocephalic and atraumatic.     Right Ear: Tympanic membrane normal.     Left Ear: Tympanic membrane normal.  Nose: Nose normal.     Mouth/Throat:     Mouth: Mucous membranes are moist.  Eyes:     Extraocular Movements: Extraocular movements intact.     Conjunctiva/sclera: Conjunctivae normal.     Pupils: Pupils are equal, round, and reactive to light.  Cardiovascular:     Rate and Rhythm: Normal rate and regular rhythm.     Pulses: Normal pulses.     Heart sounds: Normal heart sounds.  Pulmonary:     Effort: Pulmonary effort is normal.     Breath sounds: Normal breath sounds.  Abdominal:     General: Bowel sounds are normal.     Palpations: Abdomen is soft.     Tenderness: There is no abdominal tenderness.  Musculoskeletal:        General: Normal range of motion.  Skin:    General: Skin is warm and dry.     Capillary Refill: Capillary refill takes less than 2 seconds.  Neurological:     General: No focal deficit present.     Mental Status: She is alert and oriented to person, place, and time.     Cranial Nerves: No cranial nerve deficit.     Deep Tendon Reflexes: Reflexes normal.  Psychiatric:        Mood and Affect: Mood normal.        Behavior: Behavior normal.    ED Results / Procedures / Treatments   Labs (all labs ordered are listed, but only abnormal results are displayed) Results for orders placed or performed during the hospital encounter of 05/06/21  Resp Panel by RT-PCR (Flu A&B, Covid) Nasopharyngeal Swab   Specimen: Nasopharyngeal Swab; Nasopharyngeal(NP) swabs in vial transport medium  Result Value Ref Range    SARS Coronavirus 2 by RT PCR NEGATIVE NEGATIVE   Influenza A by PCR NEGATIVE NEGATIVE   Influenza B by PCR NEGATIVE NEGATIVE  CBC with Differential/Platelet  Result Value Ref Range   WBC 7.1 4.0 - 10.5 K/uL   RBC 4.29 3.87 - 5.11 MIL/uL   Hemoglobin 12.1 12.0 - 15.0 g/dL   HCT 36.9 36.0 - 46.0 %   MCV 86.0 80.0 - 100.0 fL   MCH 28.2 26.0 - 34.0 pg   MCHC 32.8 30.0 - 36.0 g/dL   RDW 14.9 11.5 - 15.5 %   Platelets 198 150 - 400 K/uL   nRBC 0.0 0.0 - 0.2 %   Neutrophils Relative % 52 %   Neutro Abs 3.7 1.7 - 7.7 K/uL   Lymphocytes Relative 30 %   Lymphs Abs 2.2 0.7 - 4.0 K/uL   Monocytes Relative 14 %   Monocytes Absolute 1.0 0.1 - 1.0 K/uL   Eosinophils Relative 3 %   Eosinophils Absolute 0.2 0.0 - 0.5 K/uL   Basophils Relative 1 %   Basophils Absolute 0.1 0.0 - 0.1 K/uL   Immature Granulocytes 0 %   Abs Immature Granulocytes 0.03 0.00 - 0.07 K/uL  Comprehensive metabolic panel  Result Value Ref Range   Sodium 139 135 - 145 mmol/L   Potassium 2.9 (L) 3.5 - 5.1 mmol/L   Chloride 103 98 - 111 mmol/L   CO2 28 22 - 32 mmol/L   Glucose, Bld 111 (H) 70 - 99 mg/dL   BUN 24 (H) 8 - 23 mg/dL   Creatinine, Ser 1.28 (H) 0.44 - 1.00 mg/dL   Calcium 10.5 (H) 8.9 - 10.3 mg/dL   Total Protein 7.4 6.5 - 8.1 g/dL   Albumin 3.9 3.5 - 5.0  g/dL   AST 29 15 - 41 U/L   ALT 20 0 - 44 U/L   Alkaline Phosphatase 75 38 - 126 U/L   Total Bilirubin 1.1 0.3 - 1.2 mg/dL   GFR, Estimated 42 (L) >60 mL/min   Anion gap 8 5 - 15  Troponin I (High Sensitivity)  Result Value Ref Range   Troponin I (High Sensitivity) 22 (H) <18 ng/L   CT Angio Head W or Wo Contrast  Result Date: 05/06/2021 CLINICAL DATA:  Vertigo and nausea EXAM: CT ANGIOGRAPHY HEAD AND NECK TECHNIQUE: Multidetector CT imaging of the head and neck was performed using the standard protocol during bolus administration of intravenous contrast. Multiplanar CT image reconstructions and MIPs were obtained to evaluate the vascular anatomy.  Carotid stenosis measurements (when applicable) are obtained utilizing NASCET criteria, using the distal internal carotid diameter as the denominator. CONTRAST:  54mL OMNIPAQUE IOHEXOL 350 MG/ML SOLN COMPARISON:  None. FINDINGS: CT HEAD FINDINGS Brain: There is no mass, hemorrhage or extra-axial collection. The size and configuration of the ventricles and extra-axial CSF spaces are normal. There is no acute or chronic infarction. The brain parenchyma is normal. Skull: The visualized skull base, calvarium and extracranial soft tissues are normal. Sinuses/Orbits: No fluid levels or advanced mucosal thickening of the visualized paranasal sinuses. No mastoid or middle ear effusion. The orbits are normal. CTA NECK FINDINGS SKELETON: There is no bony spinal canal stenosis. No lytic or blastic lesion. OTHER NECK: Normal pharynx, larynx and major salivary glands. No cervical lymphadenopathy. Unremarkable thyroid gland. UPPER CHEST: No pneumothorax or pleural effusion. No nodules or masses. AORTIC ARCH: There is calcific atherosclerosis of the aortic arch. There is no aneurysm, dissection or hemodynamically significant stenosis of the visualized portion of the aorta. Conventional 3 vessel aortic branching pattern. The visualized proximal subclavian arteries are widely patent. RIGHT CAROTID SYSTEM: Normal without aneurysm, dissection or stenosis. LEFT CAROTID SYSTEM: No dissection, occlusion or aneurysm. There is predominantly calcified atherosclerosis extending into the proximal ICA, resulting in 75% stenosis. VERTEBRAL ARTERIES: Left dominant configuration. Both origins are clearly patent. There is no dissection, occlusion or flow-limiting stenosis to the skull base (V1-V3 segments). CTA HEAD FINDINGS POSTERIOR CIRCULATION: --Vertebral arteries: Normal V4 segments. --Inferior cerebellar arteries: Normal. --Basilar artery: Normal. --Superior cerebellar arteries: Normal. --Posterior cerebral arteries (PCA): Normal. ANTERIOR  CIRCULATION: --Intracranial internal carotid arteries: Normal. --Anterior cerebral arteries (ACA): Normal. Both A1 segments are present. Patent anterior communicating artery (a-comm). --Middle cerebral arteries (MCA): Normal. VENOUS SINUSES: As permitted by contrast timing, patent. ANATOMIC VARIANTS: None Review of the MIP images confirms the above findings. IMPRESSION: 1. No emergent large vessel occlusion or high-grade stenosis of the intracranial arteries. 2. 75% stenosis of the proximal left internal carotid artery secondary to predominantly calcified atherosclerosis. Aortic atherosclerosis (ICD10-I70.0). Electronically Signed   By: Ulyses Jarred M.D.   On: 05/06/2021 02:29   CT Angio Neck W and/or Wo Contrast  Result Date: 05/06/2021 CLINICAL DATA:  Vertigo and nausea EXAM: CT ANGIOGRAPHY HEAD AND NECK TECHNIQUE: Multidetector CT imaging of the head and neck was performed using the standard protocol during bolus administration of intravenous contrast. Multiplanar CT image reconstructions and MIPs were obtained to evaluate the vascular anatomy. Carotid stenosis measurements (when applicable) are obtained utilizing NASCET criteria, using the distal internal carotid diameter as the denominator. CONTRAST:  5mL OMNIPAQUE IOHEXOL 350 MG/ML SOLN COMPARISON:  None. FINDINGS: CT HEAD FINDINGS Brain: There is no mass, hemorrhage or extra-axial collection. The size and configuration of the ventricles  and extra-axial CSF spaces are normal. There is no acute or chronic infarction. The brain parenchyma is normal. Skull: The visualized skull base, calvarium and extracranial soft tissues are normal. Sinuses/Orbits: No fluid levels or advanced mucosal thickening of the visualized paranasal sinuses. No mastoid or middle ear effusion. The orbits are normal. CTA NECK FINDINGS SKELETON: There is no bony spinal canal stenosis. No lytic or blastic lesion. OTHER NECK: Normal pharynx, larynx and major salivary glands. No cervical  lymphadenopathy. Unremarkable thyroid gland. UPPER CHEST: No pneumothorax or pleural effusion. No nodules or masses. AORTIC ARCH: There is calcific atherosclerosis of the aortic arch. There is no aneurysm, dissection or hemodynamically significant stenosis of the visualized portion of the aorta. Conventional 3 vessel aortic branching pattern. The visualized proximal subclavian arteries are widely patent. RIGHT CAROTID SYSTEM: Normal without aneurysm, dissection or stenosis. LEFT CAROTID SYSTEM: No dissection, occlusion or aneurysm. There is predominantly calcified atherosclerosis extending into the proximal ICA, resulting in 75% stenosis. VERTEBRAL ARTERIES: Left dominant configuration. Both origins are clearly patent. There is no dissection, occlusion or flow-limiting stenosis to the skull base (V1-V3 segments). CTA HEAD FINDINGS POSTERIOR CIRCULATION: --Vertebral arteries: Normal V4 segments. --Inferior cerebellar arteries: Normal. --Basilar artery: Normal. --Superior cerebellar arteries: Normal. --Posterior cerebral arteries (PCA): Normal. ANTERIOR CIRCULATION: --Intracranial internal carotid arteries: Normal. --Anterior cerebral arteries (ACA): Normal. Both A1 segments are present. Patent anterior communicating artery (a-comm). --Middle cerebral arteries (MCA): Normal. VENOUS SINUSES: As permitted by contrast timing, patent. ANATOMIC VARIANTS: None Review of the MIP images confirms the above findings. IMPRESSION: 1. No emergent large vessel occlusion or high-grade stenosis of the intracranial arteries. 2. 75% stenosis of the proximal left internal carotid artery secondary to predominantly calcified atherosclerosis. Aortic atherosclerosis (ICD10-I70.0). Electronically Signed   By: Ulyses Jarred M.D.   On: 05/06/2021 02:29   CARDIAC CATHETERIZATION  Result Date: 05/02/2021   Prox LAD to Mid LAD lesion is 80% stenosed. LM: Normal LAD: Intact antegrade flow with severe proximal 80% stenosis. Lcx: Patent  circumflex, with occluded OM vessels.  1 vessels bypassed by SVG RCA: Proximal 80% stenosis followed by occlusion.  PDA/PL bypassed by SVG. LIMA-LAD: It appears that the LIMA graft is touching do not add LAD bifurcation, with predominant flow going down the bag.  Very minimal flow going down the distal LAD from the LIMA. SVG-PDA/PL/OM2 (Y graft): Patent. No significant disease Of note, narrowing noted inside right iliac stent.  Difficulty advancing J-wire, but was able to advance Versacore and catheters through this. Severe native vessel disease with patent grafts, as detailed above. It is possible that patient has an area of ischemia in her proximal LAD territory, as it does not get significant retrograde flow from the LIMA graft.  The patient continues to be symptomatic on optimal medical therapy, we could consider PCI to her proximal LAD.   EKG EKG Interpretation  Date/Time:  Sunday May 06 2021 01:23:07 EDT Ventricular Rate:  61 PR Interval:  188 QRS Duration: 153 QT Interval:  477 QTC Calculation: 481 R Axis:   -56 Text Interpretation: Sinus rhythm Ventricular premature complex Right bundle branch block LVH with IVCD and secondary repol abnrm Confirmed by Randal Buba, Layci Stenglein ([id number]) on 05/06/2021 1:53:39 AM  Radiology CT Angio Head W or Wo Contrast  Result Date: 05/06/2021 CLINICAL DATA:  Vertigo and nausea EXAM: CT ANGIOGRAPHY HEAD AND NECK TECHNIQUE: Multidetector CT imaging of the head and neck was performed using the standard protocol during bolus administration of intravenous contrast. Multiplanar CT image reconstructions and MIPs  were obtained to evaluate the vascular anatomy. Carotid stenosis measurements (when applicable) are obtained utilizing NASCET criteria, using the distal internal carotid diameter as the denominator. CONTRAST:  33mL OMNIPAQUE IOHEXOL 350 MG/ML SOLN COMPARISON:  None. FINDINGS: CT HEAD FINDINGS Brain: There is no mass, hemorrhage or extra-axial collection. The size  and configuration of the ventricles and extra-axial CSF spaces are normal. There is no acute or chronic infarction. The brain parenchyma is normal. Skull: The visualized skull base, calvarium and extracranial soft tissues are normal. Sinuses/Orbits: No fluid levels or advanced mucosal thickening of the visualized paranasal sinuses. No mastoid or middle ear effusion. The orbits are normal. CTA NECK FINDINGS SKELETON: There is no bony spinal canal stenosis. No lytic or blastic lesion. OTHER NECK: Normal pharynx, larynx and major salivary glands. No cervical lymphadenopathy. Unremarkable thyroid gland. UPPER CHEST: No pneumothorax or pleural effusion. No nodules or masses. AORTIC ARCH: There is calcific atherosclerosis of the aortic arch. There is no aneurysm, dissection or hemodynamically significant stenosis of the visualized portion of the aorta. Conventional 3 vessel aortic branching pattern. The visualized proximal subclavian arteries are widely patent. RIGHT CAROTID SYSTEM: Normal without aneurysm, dissection or stenosis. LEFT CAROTID SYSTEM: No dissection, occlusion or aneurysm. There is predominantly calcified atherosclerosis extending into the proximal ICA, resulting in 75% stenosis. VERTEBRAL ARTERIES: Left dominant configuration. Both origins are clearly patent. There is no dissection, occlusion or flow-limiting stenosis to the skull base (V1-V3 segments). CTA HEAD FINDINGS POSTERIOR CIRCULATION: --Vertebral arteries: Normal V4 segments. --Inferior cerebellar arteries: Normal. --Basilar artery: Normal. --Superior cerebellar arteries: Normal. --Posterior cerebral arteries (PCA): Normal. ANTERIOR CIRCULATION: --Intracranial internal carotid arteries: Normal. --Anterior cerebral arteries (ACA): Normal. Both A1 segments are present. Patent anterior communicating artery (a-comm). --Middle cerebral arteries (MCA): Normal. VENOUS SINUSES: As permitted by contrast timing, patent. ANATOMIC VARIANTS: None Review of  the MIP images confirms the above findings. IMPRESSION: 1. No emergent large vessel occlusion or high-grade stenosis of the intracranial arteries. 2. 75% stenosis of the proximal left internal carotid artery secondary to predominantly calcified atherosclerosis. Aortic atherosclerosis (ICD10-I70.0). Electronically Signed   By: Ulyses Jarred M.D.   On: 05/06/2021 02:29   CT Angio Neck W and/or Wo Contrast  Result Date: 05/06/2021 CLINICAL DATA:  Vertigo and nausea EXAM: CT ANGIOGRAPHY HEAD AND NECK TECHNIQUE: Multidetector CT imaging of the head and neck was performed using the standard protocol during bolus administration of intravenous contrast. Multiplanar CT image reconstructions and MIPs were obtained to evaluate the vascular anatomy. Carotid stenosis measurements (when applicable) are obtained utilizing NASCET criteria, using the distal internal carotid diameter as the denominator. CONTRAST:  51mL OMNIPAQUE IOHEXOL 350 MG/ML SOLN COMPARISON:  None. FINDINGS: CT HEAD FINDINGS Brain: There is no mass, hemorrhage or extra-axial collection. The size and configuration of the ventricles and extra-axial CSF spaces are normal. There is no acute or chronic infarction. The brain parenchyma is normal. Skull: The visualized skull base, calvarium and extracranial soft tissues are normal. Sinuses/Orbits: No fluid levels or advanced mucosal thickening of the visualized paranasal sinuses. No mastoid or middle ear effusion. The orbits are normal. CTA NECK FINDINGS SKELETON: There is no bony spinal canal stenosis. No lytic or blastic lesion. OTHER NECK: Normal pharynx, larynx and major salivary glands. No cervical lymphadenopathy. Unremarkable thyroid gland. UPPER CHEST: No pneumothorax or pleural effusion. No nodules or masses. AORTIC ARCH: There is calcific atherosclerosis of the aortic arch. There is no aneurysm, dissection or hemodynamically significant stenosis of the visualized portion of the aorta. Conventional 3  vessel aortic branching pattern. The visualized proximal subclavian arteries are widely patent. RIGHT CAROTID SYSTEM: Normal without aneurysm, dissection or stenosis. LEFT CAROTID SYSTEM: No dissection, occlusion or aneurysm. There is predominantly calcified atherosclerosis extending into the proximal ICA, resulting in 75% stenosis. VERTEBRAL ARTERIES: Left dominant configuration. Both origins are clearly patent. There is no dissection, occlusion or flow-limiting stenosis to the skull base (V1-V3 segments). CTA HEAD FINDINGS POSTERIOR CIRCULATION: --Vertebral arteries: Normal V4 segments. --Inferior cerebellar arteries: Normal. --Basilar artery: Normal. --Superior cerebellar arteries: Normal. --Posterior cerebral arteries (PCA): Normal. ANTERIOR CIRCULATION: --Intracranial internal carotid arteries: Normal. --Anterior cerebral arteries (ACA): Normal. Both A1 segments are present. Patent anterior communicating artery (a-comm). --Middle cerebral arteries (MCA): Normal. VENOUS SINUSES: As permitted by contrast timing, patent. ANATOMIC VARIANTS: None Review of the MIP images confirms the above findings. IMPRESSION: 1. No emergent large vessel occlusion or high-grade stenosis of the intracranial arteries. 2. 75% stenosis of the proximal left internal carotid artery secondary to predominantly calcified atherosclerosis. Aortic atherosclerosis (ICD10-I70.0). Electronically Signed   By: Ulyses Jarred M.D.   On: 05/06/2021 02:29    Procedures Procedures   Medications Ordered in ED Medications  meclizine (ANTIVERT) tablet 25 mg (25 mg Oral Given 05/06/21 0125)  ondansetron (ZOFRAN) injection 4 mg (4 mg Intravenous Given 05/06/21 0125)  iohexol (OMNIPAQUE) 350 MG/ML injection 100 mL (75 mLs Intravenous Contrast Given 05/06/21 0201)  potassium chloride SA (KLOR-CON) CR tablet 60 mEq (60 mEq Oral Given 05/06/21 0227)  ketorolac (TORADOL) 30 MG/ML injection 30 mg (30 mg Intravenous Given 05/06/21 0239)    ED Course   I have reviewed the triage vital signs and the nursing notes.  Pertinent labs & imaging results that were available during my care of the patient were reviewed by me and considered in my medical decision making (see chart for details).   Symptoms consistent with peripheral vertigo, completely resolved with medication.  Patient informed of carotid stenosis and referred to vascular surgery for ongoing management.   Troponin elevated as patient is 4 days post catherization.  Troponins are always elevated within a week of a cardiac catherization..  It is trending down and I do not believe this is ACS.  Patient is not having chest pain or shortness of breath.     Michelle P Mosco was evaluated in Emergency Department on 05/06/2021 for the symptoms described in the history of present illness. She was evaluated in the context of the global COVID-19 pandemic, which necessitated consideration that the patient might be at risk for infection with the SARS-CoV-2 virus that causes COVID-19. Institutional protocols and algorithms that pertain to the evaluation of patients at risk for COVID-19 are in a state of rapid change based on information released by regulatory bodies including the CDC and federal and state organizations. These policies and algorithms were followed during the patient's care in the ED.  Final Clinical Impression(s) / ED Diagnoses Final diagnoses:  Asymptomatic stenosis of left carotid artery  Peripheral vertigo involving right ear  Hypokalemia   Return for intractable cough, coughing up blood, fevers > 100.4 unrelieved by medication, shortness of breath, intractable vomiting, chest pain, shortness of breath, weakness, numbness, changes in speech, facial asymmetry, abdominal pain, passing out, Inability to tolerate liquids or food, cough, altered mental status or any concerns. No signs of systemic illness or infection. The patient is nontoxic-appearing on exam and vital signs are within normal  limits.  I have reviewed the triage vital signs and the nursing notes. Pertinent labs & imaging  results that were available during my care of the patient were reviewed by me and considered in my medical decision making (see chart for details). After history, exam, and medical workup I feel the patient has been appropriately medically screened and is safe for discharge home. Pertinent diagnoses were discussed with the patient. Patient was given return precautions. Rx / DC Orders ED Discharge Orders          Ordered    meclizine (ANTIVERT) 12.5 MG tablet  3 times daily PRN        05/06/21 0331             Lailie Smead, MD 05/06/21 0403

## 2021-05-06 NOTE — ED Triage Notes (Signed)
Pt is present for dizziness and nausea that started when she woke up this evening at appx 2330. Pt states it feels like my head is "going around and around". Recent heart stent on 05/01/2021.

## 2021-05-14 ENCOUNTER — Other Ambulatory Visit: Payer: Self-pay

## 2021-05-14 MED ORDER — ISOSORBIDE MONONITRATE ER 30 MG PO TB24: 30.0000 mg | ORAL_TABLET | Freq: Every day | ORAL | 0 refills | Status: DC

## 2021-05-18 ENCOUNTER — Ambulatory Visit: Payer: Medicare Other | Admitting: Cardiology

## 2021-05-23 NOTE — Progress Notes (Signed)
Primary Physician/Referring:  Lucianne Lei, MD  Patient ID: [name], female    DOB: 1940-09-30, 80 y.o.   MRN: [id number]  Chief Complaint  Patient presents with   Coronary Artery Disease   Hypertension   PAD   Follow-up    6 months   HPI:    [name]  is a 80 y.o. AA female with history of hyperlipidemia, renovascular hypertension S/P renal stent in 1997, CAD S/P CABG in 1997, PAD with bilateral PTFE covered iliac stents in 2009, mild abdominal aortic ectasia noted on CT scan of the abdomen, PAD, sleep apnea but has not been able to tolerate CPAP.  Patient denies history of CKD or diabetes.  Patient was last seen in the office 03/20/2021 for chest pain and dyspnea.  At that time I added Imdur 30 mg daily and recommended cardiac catheterization.  Patient subsequently underwent left heart catheterization coronary angiography 05/01/2021 which noted moderate CAD with proximal to mid LAD 80% stenosis, patent grafts. Patient was advised to continue aspirin 81 mg daily.   On 05/06/2021 patient presented to ED with complaints of "head spinning" and nausea.  Evaluation in the emergency department was consistent with peripheral vertigo and symptoms resolved with meclizine.  During ED visit CT of the neck did note >75% stenosis of left internal carotid artery.  She has been referred to vascular surgery for further evaluation.  She now presents for follow up.  She has had no recurrence of chest pain since initiation of Imdur.  She does still complain of vertigo symptoms and requesting refill of meclizine.  Past Medical History:  Diagnosis Date   Breast cancer ([place])    s/p mastectomy   CAD (coronary artery disease)    s/p CABG   DDD (degenerative disc disease), lumbar    Discoid lupus    Dyspnea    GERD (gastroesophageal reflux disease)    Gout    HLD (hyperlipidemia)    HTN (hypertension)    Hypothyroidism    Low back pain    Obesity    OSA (obstructive sleep apnea)    did  not tolerate CPAP   Osteoporosis    PVD (peripheral vascular disease) ([place])    Renal disorder    Past Surgical History:  Procedure Laterality Date   ANKLE SURGERY  7/05   left   CORONARY ANGIOPLASTY WITH STENT PLACEMENT  2003   7 total stents placed   CORONARY ARTERY BYPASS GRAFT  1997   ELBOW SURGERY     right   hysterectomy - unknown type     LEFT HEART CATH AND CORONARY ANGIOGRAPHY N/A 05/01/2021   Procedure: LEFT HEART CATH AND CORONARY ANGIOGRAPHY;  Surgeon: Nigel Mormon, MD;  Location: [facility];  Service: Cardiovascular;  Laterality: N/A;   MASTECTOMY  1985   right   OOPHORECTOMY     RENAL ARTERY STENT     Family History  Problem Relation Age of Onset   Coronary artery disease Other    Diabetes Other    Hypertension Other    Aneurysm Other    Stroke Other    Aneurysm Mother    Alzheimer's disease Father    Hypertension Sister    Hyperlipidemia Sister    Suicidality Brother    Hypertension Sister    Hyperlipidemia Sister    Hypertension Sister    Hyperlipidemia Sister    Drug abuse Son     Social History   Tobacco Use  Smoking status: Former    Types: Cigarettes    Quit date: 07/23/1995    Years since quitting: 25.8   Smokeless tobacco: Never  Substance Use Topics   Alcohol use: No   Marital Status: Widowed  ROS  Review of Systems  Cardiovascular:  Positive for dyspnea on exertion (improving). Negative for chest pain (resolved), claudication, leg swelling, near-syncope, orthopnea, palpitations, paroxysmal nocturnal dyspnea and syncope.  Objective  Blood pressure 119/60, pulse (!) 54, resp. rate 16, height '5\' 5"'  (1.651 m), weight 189 lb (85.7 kg), SpO2 99 %.  Vitals with BMI 05/24/2021 05/06/2021 05/06/2021  Height '5\' 5"'  - -  Weight 189 lbs - -  BMI 02.77 - -  Systolic 412 - 878  Diastolic 60 - 84  Pulse 54 62 55     Physical Exam Vitals reviewed.  Cardiovascular:     Rate and Rhythm: Normal rate and regular rhythm.     Pulses:  Intact distal pulses.          Carotid pulses are 2+ on the right side and 2+ on the left side.      Radial pulses are 2+ on the right side and 2+ on the left side.       Femoral pulses are 1+ on the right side with bruit and 2+ on the left side with bruit.      Popliteal pulses are 0 on the right side and 0 on the left side.       Dorsalis pedis pulses are 0 on the right side and 0 on the left side.       Posterior tibial pulses are 1+ on the right side and 1+ on the left side.     Heart sounds: Normal heart sounds. No murmur heard.   No gallop.     Comments: Groin access site well-healed without ecchymosis, hematoma, or bruit. Pulmonary:     Effort: Pulmonary effort is normal. No respiratory distress.     Breath sounds: No wheezing, rhonchi or rales.  Musculoskeletal:     Right lower leg: No edema.     Left lower leg: No edema.  Skin:    General: Skin is warm and dry.  Neurological:     Mental Status: She is alert.   Laboratory examination:   CMP Latest Ref Rng & Units 05/06/2021 05/01/2021 03/20/2021  Glucose 70 - 99 mg/dL 111(H) 114(H) 106(H)  BUN 8 - 23 mg/dL 24(H) 19 23  Creatinine 0.44 - 1.00 mg/dL 1.28(H) 1.30(H) 1.33(H)  Sodium 135 - 145 mmol/L 139 137 139  Potassium 3.5 - 5.1 mmol/L 2.9(L) 4.1 3.2(L)  Chloride 98 - 111 mmol/L 103 103 103  CO2 22 - 32 mmol/L '28 25 26  ' Calcium 8.9 - 10.3 mg/dL 10.5(H) 10.1 10.4(H)  Total Protein 6.5 - 8.1 g/dL 7.4 - -  Total Bilirubin 0.3 - 1.2 mg/dL 1.1 - -  Alkaline Phos 38 - 126 U/L 75 - -  AST 15 - 41 U/L 29 - -  ALT 0 - 44 U/L 20 - -   CBC Latest Ref Rng & Units 05/06/2021 05/01/2021 03/20/2021  WBC 4.0 - 10.5 K/uL 7.1 6.4 8.9  Hemoglobin 12.0 - 15.0 g/dL 12.1 12.2 12.2  Hematocrit 36.0 - 46.0 % 36.9 38.6 37.3  Platelets 150 - 400 K/uL 198 205 196   Lipid Panel     Component Value Date/Time   CHOL 107 12/10/2010 0910   TRIG 93.0 12/10/2010 0910   HDL 37.40 (L) 12/10/2010  0910   CHOLHDL 3 12/10/2010 0910   VLDL 18.6  12/10/2010 0910   LDLCALC 51 12/10/2010 0910   HEMOGLOBIN A1C No results found for: HGBA1C, MPG TSH No results for input(s): TSH in the last 8760 hours.  Troponin 03/19/2021: 18 --> 17  External labs:  09/19/2020:  Total cholesterol 112, triglycerides 131, HDL 33, LDL 58.  Serum glucose 95 mg, BUN 18, creatinine 1.13, EGFR 53 mL, potassium 3.7, sodium 141.  A1c 6.0%.  TSH markedly reduced at 0.02.  Normal T3 and T4.  Hb 12.6/HCT 38.5, platelets 230.  Allergies   Allergies  Allergen Reactions   Amoxicillin Swelling   Clarithromycin Swelling    Medications Prior to Visit:   Outpatient Medications Prior to Visit  Medication Sig Dispense Refill   amLODipine (NORVASC) 10 MG tablet Take 10 mg by mouth at bedtime.     aspirin 81 MG tablet Take 1 tablet (81 mg total) by mouth daily. 30 tablet 6   atorvastatin (LIPITOR) 40 MG tablet Take 40 mg by mouth daily.     carvedilol (COREG) 12.5 MG tablet Take 1 tablet (12.5 mg total) by mouth 2 (two) times daily. 180 tablet 3   diclofenac Sodium (VOLTAREN) 1 % GEL SMARTSIG:2 Gel Topical 4 Times Daily     guanFACINE (TENEX) 2 MG tablet Take 2 mg by mouth at bedtime.     isosorbide mononitrate (IMDUR) 30 MG 24 hr tablet Take 1 tablet (30 mg total) by mouth daily. Do not take if top blood pressure number (systolic blood pressure) is less than 100. 90 tablet 0   levothyroxine (SYNTHROID) 100 MCG tablet Take 100 mcg by mouth daily.     losartan (COZAAR) 25 MG tablet Take 1 tablet (25 mg total) by mouth every evening. 90 tablet 3   multivitamin-iron-minerals-folic acid (CENTRUM) chewable tablet Chew 1 tablet by mouth daily.     meclizine (ANTIVERT) 12.5 MG tablet Take 1 tablet (12.5 mg total) by mouth 3 (three) times daily as needed for dizziness. 12 tablet 0   indapamide (LOZOL) 1.25 MG tablet Take 1.25 mg by mouth every morning.     No facility-administered medications prior to visit.   Final Medications at End of Visit    Current Meds   Medication Sig   amLODipine (NORVASC) 10 MG tablet Take 10 mg by mouth at bedtime.   aspirin 81 MG tablet Take 1 tablet (81 mg total) by mouth daily.   atorvastatin (LIPITOR) 40 MG tablet Take 40 mg by mouth daily.   carvedilol (COREG) 12.5 MG tablet Take 1 tablet (12.5 mg total) by mouth 2 (two) times daily.   diclofenac Sodium (VOLTAREN) 1 % GEL SMARTSIG:2 Gel Topical 4 Times Daily   guanFACINE (TENEX) 2 MG tablet Take 2 mg by mouth at bedtime.   isosorbide mononitrate (IMDUR) 30 MG 24 hr tablet Take 1 tablet (30 mg total) by mouth daily. Do not take if top blood pressure number (systolic blood pressure) is less than 100.   levothyroxine (SYNTHROID) 100 MCG tablet Take 100 mcg by mouth daily.   losartan (COZAAR) 25 MG tablet Take 1 tablet (25 mg total) by mouth every evening.   multivitamin-iron-minerals-folic acid (CENTRUM) chewable tablet Chew 1 tablet by mouth daily.   [DISCONTINUED] meclizine (ANTIVERT) 12.5 MG tablet Take 1 tablet (12.5 mg total) by mouth 3 (three) times daily as needed for dizziness.   Radiology:   Chest x-ray PA and lateral view 10/18/2018: The heart size borderline. The hila and mediastinum  are unremarkable. No pneumothorax. No nodules or masses. No focal infiltrates.  CTA head and neck 05/06/2021: 1. No emergent large vessel occlusion or high-grade stenosis of the intracranial arteries. 2. 75% stenosis of the proximal left internal carotid artery secondary to predominantly calcified atherosclerosis. Aortic atherosclerosis (ICD10-I70.0).  Cardiac Studies:   Coronary Artery Bypass Graft   [1997]: LIMA TO LAD; SVG TO PD AND PL , SVG TO OM. CARDIAC CATH 2003 PATENT GRAFTS.  Cardiac cath 2003 patent grafts   CABG 1997: LIMA TO LAD; SVG TO PD AND PL , SVG TO OM. CARDIAC CATH 2003 PATENT GRAFTS.  Bilateral iliac stents 2009: H/O PTFE covered- Bilateral iliac stent 2009 for instent restenosis.  Echocardiogram 10/27/2020: Normal LV systolic function with visual EF  55-60%. Left ventricle cavity is normal in size. The left ventricle is normal thickness Presence of a septal bulge. Normal global wall motion. Indeterminate diastolic filling pattern, normal LAP.  Left atrial cavity is mildly dilated. Mild (Grade I) aortic regurgitation. Mild (Grade I) mitral regurgitation. Mild tricuspid regurgitation. No evidence of pulmonary hypertension. RVSP measures 34 mmHg. Mild pulmonic regurgitation. Compared to prior study dated 10/25/2013: No significant change.   Lower Extremity Arterial Duplex 10/27/2020: No hemodynamically significant stenoses are identified in the right and left lower extremity arterial system.  Bilateral profunda femoral arteries show <50% stenosis with severely abnormal monophasic waveform. There is mild diffuse heteregeneous plaque noted through out the bilateral lower extremities. This exam reveals mildly decreased perfusion of the right lower extremity, noted at the post tibial artery level (ABI 0.84) and mildly decreased perfusion of the left lower extremity, noted at the post tibial artery level (ABI 0.84) with mildly abnormal biphasic waveform at the level of the ankles.   Lexiscan Tetrofosmin Stress Test  10/11/2020: Nondiagnostic ECG stress. Myocardial perfusion is abnormal with severe soft tissue attenuation in the infero-lateral wall. mild ischemia cannot be excluded, but appears less likely given normal wall motion in stress images. Overall LV systolic function is normal . Stress LV EF: 58%.  No previous exam available for comparison. Low risk.   Abdominal Aortic Duplex 10/27/2020: Moderate dilatation of the abdominal aorta is noted in the proximal aorta. An abdominal aortic aneurysm measuring 3.03 x 3.11 x 3.06 cm is seen. Mild ectasia also noted in mid aorta. There is mild diffuse heterogeneous plaque noted.  Left external iliac artery stenosis of <50%.  Compared to 05/29/2018,there is minimal progression of the aneurysm size form  2.77 cm. Recheck in 3 years.   Left heart catheterization coronary angiography 05/01/2021:  Prox LAD to Mid LAD lesion is 80% stenosed.   LM: Normal LAD: Intact antegrade flow with severe proximal 80% stenosis. Lcx: Patent circumflex, with occluded OM vessels.  1 vessels bypassed by SVG  RCA: Proximal 80% stenosis followed by occlusion.  PDA/PL bypassed by SVG. LIMA-LAD: It appears that the LIMA graft is touching do not add LAD bifurcation, with predominant flow going down the bag.  Very minimal flow going down the distal LAD from the LIMA. SVG-PDA/PL/OM2 (Y graft): Patent. No significant disease   Of note, narrowing noted inside right iliac stent.  Difficulty advancing J-wire, but was able to advance Versacore and catheters through this.   Severe native vessel disease with patent grafts, as detailed above. It is possible that patient has an area of ischemia in her proximal LAD territory, as it does not get significant retrograde flow from the LIMA graft.  The patient continues to be symptomatic on optimal medical therapy,  we could consider PCI to her proximal LAD.  EKG:  05/24/2021: Sinus bradycardia at a rate of 51 bpm.  Left axis, left anterior fascicular block.  Right bundle branch block.  LVH with secondary ST-T wave changes.  Compared to EKG 03/20/2021, no significant change.  EKG 10/09/2020: Sinus bradycardia at a rate of 59 bpm. Left axis, left anterior fascicular block.  Right bundle branch block.  Left ventricular hypertrophy with secondary ST-T wave abnormalities, cannot exclude ischemia.  Assessment     ICD-10-CM   1. Coronary artery disease involving coronary bypass graft of native heart without angina pectoris  I25.810 EKG 12-Lead    2. PAD (peripheral artery disease) (HCC)  I73.9     3. Primary hypertension  I10     4. Left carotid stenosis  I65.22        Medications Discontinued During This Encounter  Medication Reason   indapamide (LOZOL) 1.25 MG tablet Error    meclizine (ANTIVERT) 12.5 MG tablet Reorder     Meds ordered this encounter  Medications   meclizine (ANTIVERT) 12.5 MG tablet    Sig: Take 1 tablet (12.5 mg total) by mouth 3 (three) times daily as needed for dizziness.    Dispense:  21 tablet    Refill:  0    Orders Placed This Encounter  Procedures   EKG 12-Lead    Recommendations:   [name] is a 80 y.o. AA female with history of hyperlipidemia, renovascular hypertension S/P renal stent in 1997, CAD S/P CABG in 1997, PAD with bilateral PTFE covered iliac stents in 2009, mild abdominal aortic ectasia noted on CT scan of the abdomen, PAD, sleep apnea but has not been able to tolerate CPAP.  Patient was last seen in the office 03/20/2021 for chest pain and dyspnea.  At that time I added Imdur 30 mg daily and recommended cardiac catheterization.  Patient subsequently underwent left heart catheterization coronary angiography which noted moderate CAD with proximal to mid LAD 80% stenosis, patent grafts. Patient was advised to continue aspirin 81 mg daily. She now presents for follow up.  Reviewed and discussed with patient results of cardiac catheterization, details above.  Patient's questions were addressed.  She has had no recurrence of chest pain since initiation of Imdur.  Blood pressure is well controlled.  We will continue present medications.   Also reviewed with patient results of CT angio head and neck from ED, advised her to follow up on referral to vascular surgery sent by ED provider. In regard to vertigo symptoms, I have refilled meclizine for 1 week as patient has upcoming appointment with PCP. Will defer further management to PCP.   Follow up in 6 months, sooner if needed, for HLD, HTN, CAD, PAD.    Alethia Berthold, PA-C 05/24/2021, 11:00 AM Office: [phone]

## 2021-05-24 ENCOUNTER — Encounter: Payer: Self-pay | Admitting: Student

## 2021-05-24 ENCOUNTER — Ambulatory Visit: Payer: Medicare Other | Admitting: Student

## 2021-05-24 ENCOUNTER — Other Ambulatory Visit: Payer: Self-pay

## 2021-05-24 ENCOUNTER — Other Ambulatory Visit: Payer: Self-pay | Admitting: Student

## 2021-05-24 VITALS — BP 119/60 | HR 54 | Resp 16 | Ht 65.0 in | Wt 189.0 lb

## 2021-05-24 DIAGNOSIS — I25810 Atherosclerosis of coronary artery bypass graft(s) without angina pectoris: Secondary | ICD-10-CM

## 2021-05-24 DIAGNOSIS — I6522 Occlusion and stenosis of left carotid artery: Secondary | ICD-10-CM | POA: Diagnosis not present

## 2021-05-24 DIAGNOSIS — I739 Peripheral vascular disease, unspecified: Secondary | ICD-10-CM | POA: Diagnosis not present

## 2021-05-24 DIAGNOSIS — I10 Essential (primary) hypertension: Secondary | ICD-10-CM | POA: Diagnosis not present

## 2021-05-24 MED ORDER — MECLIZINE HCL 12.5 MG PO TABS: 12.5000 mg | ORAL_TABLET | Freq: Three times a day (TID) | ORAL | 0 refills | Status: DC | PRN

## 2021-05-31 DIAGNOSIS — I739 Peripheral vascular disease, unspecified: Secondary | ICD-10-CM | POA: Diagnosis not present

## 2021-05-31 DIAGNOSIS — R001 Bradycardia, unspecified: Secondary | ICD-10-CM | POA: Diagnosis not present

## 2021-05-31 DIAGNOSIS — E039 Hypothyroidism, unspecified: Secondary | ICD-10-CM | POA: Diagnosis not present

## 2021-05-31 DIAGNOSIS — E119 Type 2 diabetes mellitus without complications: Secondary | ICD-10-CM | POA: Diagnosis not present

## 2021-06-07 ENCOUNTER — Other Ambulatory Visit: Payer: Self-pay | Admitting: Family Medicine

## 2021-06-07 DIAGNOSIS — R4189 Other symptoms and signs involving cognitive functions and awareness: Secondary | ICD-10-CM

## 2021-06-08 ENCOUNTER — Other Ambulatory Visit: Payer: Self-pay | Admitting: Family Medicine

## 2021-06-08 DIAGNOSIS — E782 Mixed hyperlipidemia: Secondary | ICD-10-CM | POA: Diagnosis not present

## 2021-06-08 DIAGNOSIS — R4189 Other symptoms and signs involving cognitive functions and awareness: Secondary | ICD-10-CM

## 2021-06-08 DIAGNOSIS — E1169 Type 2 diabetes mellitus with other specified complication: Secondary | ICD-10-CM | POA: Diagnosis not present

## 2021-06-08 DIAGNOSIS — I10 Essential (primary) hypertension: Secondary | ICD-10-CM | POA: Diagnosis not present

## 2021-06-29 DIAGNOSIS — I10 Essential (primary) hypertension: Secondary | ICD-10-CM | POA: Diagnosis not present

## 2021-06-29 DIAGNOSIS — R600 Localized edema: Secondary | ICD-10-CM | POA: Diagnosis not present

## 2021-06-29 DIAGNOSIS — E782 Mixed hyperlipidemia: Secondary | ICD-10-CM | POA: Diagnosis not present

## 2021-07-02 DIAGNOSIS — R059 Cough, unspecified: Secondary | ICD-10-CM | POA: Diagnosis not present

## 2021-07-02 DIAGNOSIS — Z03818 Encounter for observation for suspected exposure to other biological agents ruled out: Secondary | ICD-10-CM | POA: Diagnosis not present

## 2021-07-06 ENCOUNTER — Other Ambulatory Visit: Payer: Self-pay

## 2021-07-06 ENCOUNTER — Emergency Department (HOSPITAL_BASED_OUTPATIENT_CLINIC_OR_DEPARTMENT_OTHER)
Admission: EM | Admit: 2021-07-06 | Discharge: 2021-07-06 | Disposition: A | Discharge status: Home / Self Care | Payer: Medicare Other | Attending: Emergency Medicine | Admitting: Emergency Medicine

## 2021-07-06 ENCOUNTER — Encounter (HOSPITAL_BASED_OUTPATIENT_CLINIC_OR_DEPARTMENT_OTHER): Payer: Self-pay | Admitting: *Deleted

## 2021-07-06 ENCOUNTER — Emergency Department (HOSPITAL_BASED_OUTPATIENT_CLINIC_OR_DEPARTMENT_OTHER): Payer: Medicare Other | Admitting: Radiology

## 2021-07-06 DIAGNOSIS — Z87891 Personal history of nicotine dependence: Secondary | ICD-10-CM | POA: Insufficient documentation

## 2021-07-06 DIAGNOSIS — R059 Cough, unspecified: Secondary | ICD-10-CM | POA: Diagnosis not present

## 2021-07-06 DIAGNOSIS — Z955 Presence of coronary angioplasty implant and graft: Secondary | ICD-10-CM | POA: Diagnosis not present

## 2021-07-06 DIAGNOSIS — Z20822 Contact with and (suspected) exposure to covid-19: Secondary | ICD-10-CM | POA: Diagnosis not present

## 2021-07-06 DIAGNOSIS — R0689 Other abnormalities of breathing: Secondary | ICD-10-CM | POA: Diagnosis not present

## 2021-07-06 DIAGNOSIS — Z853 Personal history of malignant neoplasm of breast: Secondary | ICD-10-CM | POA: Diagnosis not present

## 2021-07-06 DIAGNOSIS — E039 Hypothyroidism, unspecified: Secondary | ICD-10-CM | POA: Insufficient documentation

## 2021-07-06 DIAGNOSIS — I2510 Atherosclerotic heart disease of native coronary artery without angina pectoris: Secondary | ICD-10-CM | POA: Insufficient documentation

## 2021-07-06 DIAGNOSIS — Z7982 Long term (current) use of aspirin: Secondary | ICD-10-CM | POA: Diagnosis not present

## 2021-07-06 DIAGNOSIS — I517 Cardiomegaly: Secondary | ICD-10-CM | POA: Diagnosis not present

## 2021-07-06 DIAGNOSIS — J209 Acute bronchitis, unspecified: Secondary | ICD-10-CM

## 2021-07-06 DIAGNOSIS — R5383 Other fatigue: Secondary | ICD-10-CM

## 2021-07-06 DIAGNOSIS — Z79899 Other long term (current) drug therapy: Secondary | ICD-10-CM | POA: Insufficient documentation

## 2021-07-06 DIAGNOSIS — I10 Essential (primary) hypertension: Secondary | ICD-10-CM | POA: Insufficient documentation

## 2021-07-06 DIAGNOSIS — R7989 Other specified abnormal findings of blood chemistry: Secondary | ICD-10-CM

## 2021-07-06 LAB — CBC WITH DIFFERENTIAL/PLATELET
Abs Immature Granulocytes: 0.02 10*3/uL (ref 0.00–0.07)
Basophils Absolute: 0.1 10*3/uL (ref 0.0–0.1)
Basophils Relative: 1 %
Eosinophils Absolute: 0.2 10*3/uL (ref 0.0–0.5)
Eosinophils Relative: 2 %
HCT: 38 % (ref 36.0–46.0)
Hemoglobin: 12.4 g/dL (ref 12.0–15.0)
Immature Granulocytes: 0 %
Lymphocytes Relative: 27 %
Lymphs Abs: 2.6 10*3/uL (ref 0.7–4.0)
MCH: 28.2 pg (ref 26.0–34.0)
MCHC: 32.6 g/dL (ref 30.0–36.0)
MCV: 86.4 fL (ref 80.0–100.0)
Monocytes Absolute: 1.5 10*3/uL — ABNORMAL HIGH (ref 0.1–1.0)
Monocytes Relative: 16 %
Neutro Abs: 5.3 10*3/uL (ref 1.7–7.7)
Neutrophils Relative %: 54 %
Platelets: 212 10*3/uL (ref 150–400)
RBC: 4.4 MIL/uL (ref 3.87–5.11)
RDW: 14.8 % (ref 11.5–15.5)
WBC: 9.7 10*3/uL (ref 4.0–10.5)
nRBC: 0 % (ref 0.0–0.2)

## 2021-07-06 LAB — BASIC METABOLIC PANEL
Anion gap: 9 (ref 5–15)
BUN: 17 mg/dL (ref 8–23)
CO2: 27 mmol/L (ref 22–32)
Calcium: 10.7 mg/dL — ABNORMAL HIGH (ref 8.9–10.3)
Chloride: 103 mmol/L (ref 98–111)
Creatinine, Ser: 1.15 mg/dL — ABNORMAL HIGH (ref 0.44–1.00)
GFR, Estimated: 48 mL/min — ABNORMAL LOW (ref >60)
Glucose, Bld: 95 mg/dL (ref 70–99)
Potassium: 3.5 mmol/L (ref 3.5–5.1)
Sodium: 139 mmol/L (ref 135–145)

## 2021-07-06 LAB — RESP PANEL BY RT-PCR (FLU A&B, COVID) ARPGX2
Influenza A by PCR: NEGATIVE
Influenza B by PCR: NEGATIVE
SARS Coronavirus 2 by RT PCR: NEGATIVE

## 2021-07-06 LAB — BRAIN NATRIURETIC PEPTIDE: B Natriuretic Peptide: 152.2 pg/mL — ABNORMAL HIGH (ref 0.0–100.0)

## 2021-07-06 MED ORDER — DOXYCYCLINE HYCLATE 100 MG PO CAPS: 100.0000 mg | ORAL_CAPSULE | Freq: Two times a day (BID) | ORAL | 0 refills | Status: DC

## 2021-07-06 NOTE — ED Triage Notes (Signed)
Pt reports coughing since Sunday. Seen at [facility] on Monday. Given rx for cough medication, says cough is not better. COVID and FLU negative. Denies fevers.

## 2021-07-06 NOTE — ED Notes (Signed)
S1 S2 heard, Lung sounds diminished. Pt complains of generalized pain throat chest and eye. Pt tsates she has had a productive cough with yellow/green mucous but currently its a dry cough.

## 2021-07-06 NOTE — ED Provider Notes (Signed)
[facility] EMERGENC[facility] DEPT Provider Note   CSN: [id number] Arrival date & time: 07/06/21  1275     History Chief Complaint  Patient presents with   Cough    [name] is a 80 y.o. female.  Patient presents with recurrent cough, throat pain, mild productive yellow mucus.  Patient seen in urgent care on Monday and given cough medications however does not feel better.  Patient had COVID test negative at that point.  Patient denies fevers.  Patient has history of CAD, peripheral vascular disease, reflux, high blood pressure, no current heart failure.  No weight gain or leg swelling.  No significant shortness of breath.  Recurrent cough.      Past Medical History:  Diagnosis Date   Breast cancer [facility])    s/p mastectomy   CAD (coronary artery disease)    s/p CABG   DDD (degenerative disc disease), lumbar    Discoid lupus    Dyspnea    GERD (gastroesophageal reflux disease)    Gout    HLD (hyperlipidemia)    HTN (hypertension)    Hypothyroidism    Low back pain    Obesity    OSA (obstructive sleep apnea)    did not tolerate CPAP   Osteoporosis    PVD (peripheral vascular disease) ([place])    Renal disorder     Patient Active Problem List   Diagnosis Date Noted   CAD (coronary artery disease) 05/01/2021   Angina pectoris ([place]) 04/30/2021   ARF (acute renal failure) ([place]) 04/10/2013   Hyperkalemia 04/10/2013   Hypotension, unspecified 04/10/2013   Sinus bradycardia 04/10/2013   DYSPNEA 01/03/2010   LUPUS ERYTHEMATOSUS, DISCOID 06/12/2007   HYPOTHYROIDISM 06/11/2007   HYPERLIPIDEMIA 06/11/2007   GOUT 06/11/2007   HYPERTENSION 06/11/2007   CORONARY ARTERY DISEASE 06/11/2007   PERIPHERAL VASCULAR DISEASE 06/11/2007   GERD 06/11/2007   LOW BACK PAIN 06/11/2007   OSTEOPOROSIS 06/11/2007   BREAST CANCER, HX OF 06/11/2007    Past Surgical History:  Procedure Laterality Date   ANKLE SURGERY  7/05   left   CORONARY ANGIOPLASTY WITH STENT PLACEMENT   2003   7 total stents placed   CORONARY ARTERY BYPASS GRAFT  1997   ELBOW SURGERY     right   hysterectomy - unknown type     LEFT HEART CATH AND CORONARY ANGIOGRAPHY N/A 05/01/2021   Procedure: LEFT HEART CATH AND CORONARY ANGIOGRAPHY;  Surgeon: Nigel Mormon, MD;  Location: [facility];  Service: Cardiovascular;  Laterality: N/A;   MASTECTOMY  1985   right   OOPHORECTOMY     RENAL ARTERY STENT       OB History   No obstetric history on file.     Family History  Problem Relation Age of Onset   Coronary artery disease Other    Diabetes Other    Hypertension Other    Aneurysm Other    Stroke Other    Aneurysm Mother    Alzheimer's disease Father    Hypertension Sister    Hyperlipidemia Sister    Suicidality Brother    Hypertension Sister    Hyperlipidemia Sister    Hypertension Sister    Hyperlipidemia Sister    Drug abuse Son     Social History   Tobacco Use   Smoking status: Former    Types: Cigarettes    Quit date: 07/23/1995    Years since quitting: 25.9   Smokeless tobacco: Never  Vaping Use  Vaping Use: Never used  Substance Use Topics   Alcohol use: No   Drug use: No    Home Medications Prior to Admission medications   Medication Sig Start Date End Date Taking? Authorizing Provider  doxycycline (VIBRAMYCIN) 100 MG capsule Take 1 capsule (100 mg total) by mouth 2 (two) times daily. One po bid x 7 days 07/06/21  Yes Elnora Morrison, MD  amLODipine (NORVASC) 10 MG tablet Take 10 mg by mouth at bedtime. 09/05/20   [provider]  aspirin 81 MG tablet Take 1 tablet (81 mg total) by mouth daily. 12/10/10   Larey Dresser, MD  atorvastatin (LIPITOR) 40 MG tablet Take 40 mg by mouth daily.    [provider]  carvedilol (COREG) 12.5 MG tablet Take 1 tablet (12.5 mg total) by mouth 2 (two) times daily. 10/09/20   Cantwell, Celeste C, PA-C  diclofenac Sodium (VOLTAREN) 1 % GEL SMARTSIG:2 Gel Topical 4 Times Daily 02/22/21    [provider]  guanFACINE (TENEX) 2 MG tablet Take 2 mg by mouth at bedtime.    [provider]  isosorbide mononitrate (IMDUR) 30 MG 24 hr tablet Take 1 tablet (30 mg total) by mouth daily. Do not take if top blood pressure number (systolic blood pressure) is less than 100. 05/14/21 06/13/21  Cantwell, Celeste C, PA-C  levothyroxine (SYNTHROID) 100 MCG tablet Take 100 mcg by mouth daily.    [provider]  losartan (COZAAR) 25 MG tablet Take 1 tablet (25 mg total) by mouth every evening. 11/10/20 11/05/21  Adrian Prows, MD  meclizine (ANTIVERT) 12.5 MG tablet Take 1 tablet (12.5 mg total) by mouth 3 (three) times daily as needed for dizziness. 05/24/21   Cantwell, Celeste C, PA-C  multivitamin-iron-minerals-folic acid (CENTRUM) chewable tablet Chew 1 tablet by mouth daily.    [provider]    Allergies    Amoxicillin and Clarithromycin  Review of Systems   Review of Systems  Constitutional:  Positive for fatigue. Negative for chills and fever.  HENT:  Positive for congestion.   Eyes:  Negative for visual disturbance.  Respiratory:  Positive for cough. Negative for shortness of breath.   Cardiovascular:  Negative for chest pain.  Gastrointestinal:  Negative for abdominal pain and vomiting.  Genitourinary:  Negative for dysuria and flank pain.  Musculoskeletal:  Negative for back pain, neck pain and neck stiffness.  Skin:  Negative for rash.  Neurological:  Negative for light-headedness and headaches.   Physical Exam Updated Vital Signs BP (!) 150/64    Pulse 63    Temp 98.9 F (37.2 C)    Resp 16    SpO2 94%   Physical Exam Vitals and nursing note reviewed.  Constitutional:      General: She is not in acute distress.    Appearance: She is well-developed.  HENT:     Head: Normocephalic and atraumatic.     Nose: Congestion present.     Mouth/Throat:     Mouth: Mucous membranes are moist.  Eyes:     General:        Right eye: No discharge.         Left eye: No discharge.     Conjunctiva/sclera: Conjunctivae normal.  Neck:     Trachea: No tracheal deviation.  Cardiovascular:     Rate and Rhythm: Normal rate and regular rhythm.     Heart sounds: No murmur heard. Pulmonary:     Effort: Pulmonary effort is normal.  Breath sounds: Normal breath sounds.  Abdominal:     General: There is no distension.     Palpations: Abdomen is soft.     Tenderness: There is no abdominal tenderness. There is no guarding.  Musculoskeletal:     Cervical back: Normal range of motion and neck supple. No rigidity.  Skin:    General: Skin is warm.     Capillary Refill: Capillary refill takes less than 2 seconds.     Findings: No rash.  Neurological:     General: No focal deficit present.     Mental Status: She is alert.     Cranial Nerves: No cranial nerve deficit.  Psychiatric:        Mood and Affect: Mood normal.    ED Results / Procedures / Treatments   Labs (all labs ordered are listed, but only abnormal results are displayed) Labs Reviewed  BRAIN NATRIURETIC PEPTIDE - Abnormal; Notable for the following components:      Result Value   B Natriuretic Peptide 152.2 (*)    All other components within normal limits  BASIC METABOLIC PANEL - Abnormal; Notable for the following components:   Creatinine, Ser 1.15 (*)    Calcium 10.7 (*)    GFR, Estimated 48 (*)    All other components within normal limits  CBC WITH DIFFERENTIAL/PLATELET - Abnormal; Notable for the following components:   Monocytes Absolute 1.5 (*)    All other components within normal limits  RESP PANEL BY RT-PCR (FLU A&B, COVID) ARPGX2    EKG None  Radiology DG Chest 2 View  Result Date: 07/06/2021 CLINICAL DATA:  Cough, congestion EXAM: CHEST - 2 VIEW COMPARISON:  03/20/2021 FINDINGS: Cardiomegaly status post median sternotomy and CABG. Trace pleural effusions and/or chronic pleural thickening. Disc degenerative disease of the thoracic spine. IMPRESSION: 1.  Trace pleural effusions and/or chronic pleural thickening. No acute airspace opacity. 2. Cardiomegaly status post median sternotomy and CABG. Electronically Signed   By: Delanna Ahmadi M.D.   On: 07/06/2021 07:44    Procedures Procedures   Medications Ordered in ED Medications - No data to display  ED Course  I have reviewed the triage vital signs and the nursing notes.  Pertinent labs & imaging results that were available during my care of the patient were reviewed by me and considered in my medical decision making (see chart for details).    MDM Rules/Calculators/A&P                          Patient presents with recurrent cough throughout the week productive in nature.  Discussed differential including viral respiratory faction/bronchitis/bacterial pneumonia versus less likely heart failure..  Chest x-ray reviewed trace effusions no obvious infiltrate.  With second visit and also fatigue plan to check blood work to check electrolytes, kidney function and signs of anemia.  Discussed plan for trial of azithromycin if blood work unremarkable.  Blood work ordered and reviewed overall reassuring mild elevated BNP 150, creatinine chronically mild elevated 1.15, hemoglobin white blood cell count normal.  Concern for mild CHF versus pneumonia versus other.  Plan for oral antibiotics, close follow-up with cardiology and reasons to return discussed.    Final Clinical Impression(s) / ED Diagnoses Final diagnoses:  Acute bronchitis, unspecified organism  Fatigue, unspecified type  Elevated brain natriuretic peptide (BNP) level    Rx / DC Orders ED Discharge Orders          Ordered  doxycycline (VIBRAMYCIN) 100 MG capsule  2 times daily        07/06/21 1301             Elnora Morrison, MD 07/06/21 1303

## 2021-07-06 NOTE — Discharge Instructions (Addendum)
Follow-up closely with heart doctor for echo to look for heart failure and your primary doctor. Try antibiotics for 5 days. Return for worsening breathing difficulty or new concerns.

## 2021-07-10 ENCOUNTER — Emergency Department (HOSPITAL_BASED_OUTPATIENT_CLINIC_OR_DEPARTMENT_OTHER)
Admission: EM | Admit: 2021-07-10 | Discharge: 2021-07-10 | Disposition: A | Discharge status: Home / Self Care | Payer: Medicare Other | Attending: Emergency Medicine | Admitting: Emergency Medicine

## 2021-07-10 ENCOUNTER — Emergency Department (HOSPITAL_BASED_OUTPATIENT_CLINIC_OR_DEPARTMENT_OTHER): Payer: Medicare Other

## 2021-07-10 ENCOUNTER — Encounter (HOSPITAL_BASED_OUTPATIENT_CLINIC_OR_DEPARTMENT_OTHER): Payer: Self-pay | Admitting: Emergency Medicine

## 2021-07-10 ENCOUNTER — Other Ambulatory Visit: Payer: Self-pay

## 2021-07-10 DIAGNOSIS — Z87891 Personal history of nicotine dependence: Secondary | ICD-10-CM | POA: Insufficient documentation

## 2021-07-10 DIAGNOSIS — I10 Essential (primary) hypertension: Secondary | ICD-10-CM | POA: Insufficient documentation

## 2021-07-10 DIAGNOSIS — Z853 Personal history of malignant neoplasm of breast: Secondary | ICD-10-CM | POA: Insufficient documentation

## 2021-07-10 DIAGNOSIS — Z951 Presence of aortocoronary bypass graft: Secondary | ICD-10-CM | POA: Diagnosis not present

## 2021-07-10 DIAGNOSIS — M109 Gout, unspecified: Secondary | ICD-10-CM | POA: Diagnosis not present

## 2021-07-10 DIAGNOSIS — M19071 Primary osteoarthritis, right ankle and foot: Secondary | ICD-10-CM | POA: Diagnosis not present

## 2021-07-10 DIAGNOSIS — E039 Hypothyroidism, unspecified: Secondary | ICD-10-CM | POA: Diagnosis not present

## 2021-07-10 DIAGNOSIS — I2510 Atherosclerotic heart disease of native coronary artery without angina pectoris: Secondary | ICD-10-CM | POA: Diagnosis not present

## 2021-07-10 DIAGNOSIS — M79671 Pain in right foot: Secondary | ICD-10-CM | POA: Diagnosis present

## 2021-07-10 MED ORDER — OXYCODONE-ACETAMINOPHEN 5-325 MG PO TABS: 1.0000 | ORAL_TABLET | Freq: Four times a day (QID) | ORAL | 0 refills | Status: DC | PRN

## 2021-07-10 MED ORDER — SENNOSIDES-DOCUSATE SODIUM 8.6-50 MG PO TABS: 1.0000 | ORAL_TABLET | Freq: Every evening | ORAL | 0 refills | Status: DC | PRN

## 2021-07-10 MED ORDER — PREDNISONE 10 MG (21) PO TBPK: ORAL_TABLET | Freq: Every day | ORAL | 0 refills | Status: DC

## 2021-07-10 MED ORDER — MORPHINE SULFATE (PF) 4 MG/ML IV SOLN
4.0000 mg | Freq: Once | INTRAVENOUS | Status: AC
  Administered 2021-07-10: 07:00:00 4 mg via INTRAMUSCULAR
  Filled 2021-07-10: qty 1

## 2021-07-10 NOTE — Discharge Instructions (Signed)
You were seen in the emergency room today with foot pain.  I suspect that you are having an attack of gout.  I am starting you on steroid and have called in pain medications.  These can cause constipation so I have also called in some constipation medicines.  Please follow with your primary care doctor in the coming week.

## 2021-07-10 NOTE — ED Triage Notes (Signed)
Pt states her right foot has been swollen and painful since yesterday.  Pt has hx of gout.

## 2021-07-10 NOTE — ED Provider Notes (Signed)
Emergency Department Provider Note   I have reviewed the triage vital signs and the nursing notes.   HISTORY  Chief Complaint Foot Pain   HPI [name] is a 80 y.o. female with past medical history reviewed below presents emergency department evaluation of acute onset severe pain in the right first MTP.  Patient has had a history of gout and states this feels similar.  Pain began yesterday.  Denies any fevers or shaking chills.  No numbness.  Denies injury.  Pain is severe and worse with movement or walking.  No radiation of symptoms or other modifying factors.  Past Medical History:  Diagnosis Date   Breast cancer [facility])    s/p mastectomy   CAD (coronary artery disease)    s/p CABG   DDD (degenerative disc disease), lumbar    Discoid lupus    Dyspnea    GERD (gastroesophageal reflux disease)    Gout    HLD (hyperlipidemia)    HTN (hypertension)    Hypothyroidism    Low back pain    Obesity    OSA (obstructive sleep apnea)    did not tolerate CPAP   Osteoporosis    PVD (peripheral vascular disease) ([place])    Renal disorder     Patient Active Problem List   Diagnosis Date Noted   CAD (coronary artery disease) 05/01/2021   Angina pectoris ([place]) 04/30/2021   ARF (acute renal failure) ([place]) 04/10/2013   Hyperkalemia 04/10/2013   Hypotension, unspecified 04/10/2013   Sinus bradycardia 04/10/2013   DYSPNEA 01/03/2010   LUPUS ERYTHEMATOSUS, DISCOID 06/12/2007   HYPOTHYROIDISM 06/11/2007   HYPERLIPIDEMIA 06/11/2007   GOUT 06/11/2007   HYPERTENSION 06/11/2007   CORONARY ARTERY DISEASE 06/11/2007   PERIPHERAL VASCULAR DISEASE 06/11/2007   GERD 06/11/2007   LOW BACK PAIN 06/11/2007   OSTEOPOROSIS 06/11/2007   BREAST CANCER, HX OF 06/11/2007    Past Surgical History:  Procedure Laterality Date   ANKLE SURGERY  7/05   left   CORONARY ANGIOPLASTY WITH STENT PLACEMENT  2003   7 total stents placed   CORONARY ARTERY BYPASS GRAFT  1997   ELBOW SURGERY      right   hysterectomy - unknown type     LEFT HEART CATH AND CORONARY ANGIOGRAPHY N/A 05/01/2021   Procedure: LEFT HEART CATH AND CORONARY ANGIOGRAPHY;  Surgeon: Nigel Mormon, MD;  Location: [facility];  Service: Cardiovascular;  Laterality: N/A;   MASTECTOMY  1985   right   OOPHORECTOMY     RENAL ARTERY STENT      Allergies Amoxicillin and Clarithromycin  Family History  Problem Relation Age of Onset   Coronary artery disease Other    Diabetes Other    Hypertension Other    Aneurysm Other    Stroke Other    Aneurysm Mother    Alzheimer's disease Father    Hypertension Sister    Hyperlipidemia Sister    Suicidality Brother    Hypertension Sister    Hyperlipidemia Sister    Hypertension Sister    Hyperlipidemia Sister    Drug abuse Son     Social History Social History   Tobacco Use   Smoking status: Former    Types: Cigarettes    Quit date: 07/23/1995    Years since quitting: 25.9   Smokeless tobacco: Never  Vaping Use   Vaping Use: Never used  Substance Use Topics   Alcohol use: No   Drug use: No    Review  of Systems  Constitutional: No fever/chills Eyes: No visual changes. ENT: No sore throat. Cardiovascular: Denies chest pain. Respiratory: Denies shortness of breath. Gastrointestinal: No abdominal pain.  No nausea, no vomiting.  No diarrhea.  No constipation. Genitourinary: Negative for dysuria. Musculoskeletal: Positive right foot pain.  Skin: Negative for rash. Neurological: Negative for headaches, focal weakness or numbness.  10-point ROS otherwise negative.  ____________________________________________   PHYSICAL EXAM:  VITAL SIGNS: ED Triage Vitals  Enc Vitals Group     BP 07/10/21 0718 (!) 179/94     Pulse Rate 07/10/21 0718 (!) 111     Resp 07/10/21 0718 (!) 22     Temp 07/10/21 0718 98.5 F (36.9 C)     Temp Source 07/10/21 0718 Oral     SpO2 07/10/21 0718 96 %     Weight 07/10/21 0718 191 lb (86.6 kg)     Height  07/10/21 0718 5\' 5"  (1.651 m)   Constitutional: Alert and oriented.  Patient is grimacing and appears very uncomfortable. Eyes: Conjunctivae are normal.  Head: Atraumatic. Nose: No congestion/rhinnorhea. Mouth/Throat: Mucous membranes are moist.   Neck: No stridor.   Cardiovascular: Tachycardia. Good peripheral circulation. Grossly normal heart sounds.   Respiratory: Normal respiratory effort.  No retractions. Lungs CTAB. Gastrointestinal: Soft and nontender. No distention.  Musculoskeletal: Swelling with mild erythema over the first MTP.  No ankle swelling or redness.  Normal range of motion of the ankle and knee.  Neurologic:  Normal speech and language. No gross focal neurologic deficits are appreciated.  Skin:  Skin is warm, dry and intact. No rash noted.  ____________________________________________  RADIOLOGY  DG Foot Complete Right  Result Date: 07/10/2021 CLINICAL DATA:  1st MTP joint pain. EXAM: RIGHT FOOT COMPLETE - 3+ VIEW COMPARISON:  None. FINDINGS: No evidence for an acute fracture. No subluxation or dislocation. No worrisome lytic or sclerotic osseous abnormality. Prominent degenerative changes noted first MTP joint. Surgical clips are identified in the medial ankle and midfoot. IMPRESSION: Prominent degenerative changes at the first MTP joint. No acute bony abnormality. Electronically Signed   By: Misty Stanley M.D.   On: 07/10/2021 07:45    ____________________________________________   PROCEDURES  Procedure(s) performed:   Procedures  None  ____________________________________________   INITIAL IMPRESSION / ASSESSMENT AND PLAN / ED COURSE  Pertinent labs & imaging results that were available during my care of the patient were reviewed by me and considered in my medical decision making (see chart for details).   Presents emergency department with pain in the right foot.  No injury to suspect fracture or dislocation.  The patient is not having symptoms to  suspect septic process or septic joint.  Gout is a consideration in this is the classic location for gout.  She has had this before and states it feels similar.  She has tolerated steroids well in the past.  I have lab results from 12/16 during ED visit for unrelated complaint and kidney function is 1.15. Plan for steroid course and PCP follow up.   Plain films reviewed.  Patient does have some prominent degenerative changes in the first MTP but no other acute bony abnormality.  Plan to treat empirically for gout flare.  In discussion with the patient and family at bedside she is tolerated steroid well in the past for this.  Plan for prednisone burst with taper and pain medication called into her pharmacy.  Reviewed the [facility] drug database prior to prescription.  Discussed the risk/benefit of pain medication  and provided meds for constipation should she need to take this. Has family assistance at home and appears safe for discharge.  ____________________________________________  FINAL CLINICAL IMPRESSION(S) / ED DIAGNOSES  Final diagnoses:  Acute gout of right foot, unspecified cause     MEDICATIONS GIVEN DURING THIS VISIT:  Medications  morphine 4 MG/ML injection 4 mg (4 mg Intramuscular Given 07/10/21 0728)     NEW OUTPATIENT MEDICATIONS STARTED DURING THIS VISIT:  Discharge Medication List as of 07/10/2021  8:01 AM     START taking these medications   Details  oxyCODONE-acetaminophen (PERCOCET/ROXICET) 5-325 MG tablet Take 1 tablet by mouth every 6 (six) hours as needed for severe pain., Starting Tue 07/10/2021, Normal    predniSONE (STERAPRED UNI-PAK 21 TAB) 10 MG (21) TBPK tablet Take by mouth daily. Take 6 tabs by mouth daily  for 2 days, then 5 tabs for 2 days, then 4 tabs for 2 days, then 3 tabs for 2 days, 2 tabs for 2 days, then 1 tab by mouth daily for 2 days, Starting Tue 07/10/2021, Normal    senna-docusate (SENOKOT-S) 8.6-50 MG tablet Take 1 tablet by mouth at  bedtime as needed for mild constipation., Starting Tue 07/10/2021, Normal        Note:  This document was prepared using Dragon voice recognition software and may include unintentional dictation errors.  Nanda Quinton, MD, [facility] Emergency Medicine    Katlen Seyer, [facility], MD 07/10/21 [id number]

## 2021-07-10 NOTE — ED Notes (Signed)
Patient given discharge instructions and reviewed.  Pt denies any questions at this time.  Patient discharged in care of granddaughter.

## 2021-07-12 DIAGNOSIS — I10 Essential (primary) hypertension: Secondary | ICD-10-CM | POA: Diagnosis not present

## 2021-07-12 DIAGNOSIS — H109 Unspecified conjunctivitis: Secondary | ICD-10-CM | POA: Diagnosis not present

## 2021-07-12 DIAGNOSIS — M130 Polyarthritis, unspecified: Secondary | ICD-10-CM | POA: Diagnosis not present

## 2021-07-12 DIAGNOSIS — J069 Acute upper respiratory infection, unspecified: Secondary | ICD-10-CM | POA: Diagnosis not present

## 2021-07-12 DIAGNOSIS — E039 Hypothyroidism, unspecified: Secondary | ICD-10-CM | POA: Diagnosis not present

## 2021-07-27 ENCOUNTER — Other Ambulatory Visit: Payer: Medicare Other

## 2021-08-10 ENCOUNTER — Other Ambulatory Visit: Payer: Medicare Other

## 2021-08-14 DIAGNOSIS — I10 Essential (primary) hypertension: Secondary | ICD-10-CM | POA: Diagnosis not present

## 2021-08-14 DIAGNOSIS — R600 Localized edema: Secondary | ICD-10-CM | POA: Diagnosis not present

## 2021-08-14 DIAGNOSIS — M1000 Idiopathic gout, unspecified site: Secondary | ICD-10-CM | POA: Diagnosis not present

## 2021-08-14 DIAGNOSIS — E7800 Pure hypercholesterolemia, unspecified: Secondary | ICD-10-CM | POA: Diagnosis not present

## 2021-08-14 DIAGNOSIS — E119 Type 2 diabetes mellitus without complications: Secondary | ICD-10-CM | POA: Diagnosis not present

## 2021-08-17 ENCOUNTER — Ambulatory Visit
Admission: RE | Admit: 2021-08-17 | Discharge: 2021-08-17 | Disposition: A | Discharge status: Home / Self Care | Payer: Medicare Other | Source: Ambulatory Visit | Attending: Family Medicine | Admitting: Family Medicine

## 2021-08-17 ENCOUNTER — Other Ambulatory Visit: Payer: Self-pay

## 2021-08-17 DIAGNOSIS — R4701 Aphasia: Secondary | ICD-10-CM | POA: Diagnosis not present

## 2021-08-17 DIAGNOSIS — R4189 Other symptoms and signs involving cognitive functions and awareness: Secondary | ICD-10-CM

## 2021-08-17 DIAGNOSIS — Z853 Personal history of malignant neoplasm of breast: Secondary | ICD-10-CM | POA: Diagnosis not present

## 2021-08-17 MED ORDER — GADOBENATE DIMEGLUMINE 529 MG/ML IV SOLN
18.0000 mL | Freq: Once | INTRAVENOUS | Status: AC | PRN
  Administered 2021-08-17: 18 mL via INTRAVENOUS

## 2021-08-28 DIAGNOSIS — E1169 Type 2 diabetes mellitus with other specified complication: Secondary | ICD-10-CM | POA: Diagnosis not present

## 2021-08-28 DIAGNOSIS — I6522 Occlusion and stenosis of left carotid artery: Secondary | ICD-10-CM | POA: Diagnosis not present

## 2021-08-28 DIAGNOSIS — M10072 Idiopathic gout, left ankle and foot: Secondary | ICD-10-CM | POA: Diagnosis not present

## 2021-08-28 DIAGNOSIS — E785 Hyperlipidemia, unspecified: Secondary | ICD-10-CM | POA: Diagnosis not present

## 2021-08-28 DIAGNOSIS — I10 Essential (primary) hypertension: Secondary | ICD-10-CM | POA: Diagnosis not present

## 2021-09-04 ENCOUNTER — Other Ambulatory Visit: Payer: Self-pay

## 2021-09-04 ENCOUNTER — Emergency Department (HOSPITAL_BASED_OUTPATIENT_CLINIC_OR_DEPARTMENT_OTHER): Payer: Medicare Other

## 2021-09-04 ENCOUNTER — Emergency Department (HOSPITAL_BASED_OUTPATIENT_CLINIC_OR_DEPARTMENT_OTHER)
Admission: EM | Admit: 2021-09-04 | Discharge: 2021-09-04 | Disposition: A | Discharge status: Home / Self Care | Payer: Medicare Other | Attending: Emergency Medicine | Admitting: Emergency Medicine

## 2021-09-04 ENCOUNTER — Encounter (HOSPITAL_BASED_OUTPATIENT_CLINIC_OR_DEPARTMENT_OTHER): Payer: Self-pay | Admitting: *Deleted

## 2021-09-04 DIAGNOSIS — M79672 Pain in left foot: Secondary | ICD-10-CM | POA: Diagnosis present

## 2021-09-04 DIAGNOSIS — Z79899 Other long term (current) drug therapy: Secondary | ICD-10-CM | POA: Diagnosis not present

## 2021-09-04 DIAGNOSIS — M76822 Posterior tibial tendinitis, left leg: Secondary | ICD-10-CM | POA: Insufficient documentation

## 2021-09-04 DIAGNOSIS — M79605 Pain in left leg: Secondary | ICD-10-CM | POA: Diagnosis not present

## 2021-09-04 DIAGNOSIS — Z7982 Long term (current) use of aspirin: Secondary | ICD-10-CM | POA: Diagnosis not present

## 2021-09-04 DIAGNOSIS — M76829 Posterior tibial tendinitis, unspecified leg: Secondary | ICD-10-CM

## 2021-09-04 MED ORDER — OXYCODONE HCL 5 MG PO TABS: 5.0000 mg | ORAL_TABLET | Freq: Three times a day (TID) | ORAL | 0 refills | Status: DC | PRN

## 2021-09-04 MED ORDER — OXYCODONE HCL 5 MG PO TABS
5.0000 mg | ORAL_TABLET | Freq: Once | ORAL | Status: AC
  Administered 2021-09-04: 5 mg via ORAL
  Filled 2021-09-04: qty 1

## 2021-09-04 MED ORDER — DICLOFENAC SODIUM 1 % EX GEL: 4.0000 g | Freq: Four times a day (QID) | CUTANEOUS | 0 refills | Status: AC

## 2021-09-04 NOTE — Discharge Instructions (Addendum)
Oxycontin every 8 hours for pain as needed. Voltaren gel, apply 4 times a day  Use boot to ambulate.  It is important to take off periodically to do gentle stretching of the foot.

## 2021-09-04 NOTE — ED Notes (Signed)
1+ left pedal and 1+ left post tib pulse noted. Had immediate increase in pain upon palpation. Due to pain is unable to plantar or dorsal flex left foot. Left foot / ankle area appears swollen at this time and is very tender to touch.

## 2021-09-04 NOTE — ED Triage Notes (Signed)
Having left foot pain, that radiates to her left knee. Onset this past Saturday. States she is unable to place wt on LLE.

## 2021-09-04 NOTE — ED Provider Notes (Signed)
[facility]ERGENC[facility] DEPARTMENT Provider Note   CSN: [id number] Arrival date & time: 09/04/21  0746     History Chief Complaint  Patient presents with   Foot Pain    [name] is a 81 y.o. female.  Reports ache in bottom of left foot that radiates upward to mid calf.  Started on Saturday at work. Took Tylenol without relief.  Worse Sunday after wearing new shoes.  Was unable to weight bear today secondary to increase in pain. Denies any increase in activity, fevers, numbness, tingling or weakness in lower extremities.  No recent travel or sick contacts. Denies and chest pain or shortness of breath.      Home Medications Prior to Admission medications   Medication Sig Start Date End Date Taking? Authorizing Provider  diclofenac Sodium (VOLTAREN) 1 % GEL Apply 4 g topically 4 (four) times daily. 09/04/21  Yes Carollee Leitz, MD  oxyCODONE (ROXICODONE) 5 MG immediate release tablet Take 1 tablet (5 mg total) by mouth every 8 (eight) hours as needed. 09/04/21  Yes Carollee Leitz, MD  amLODipine (NORVASC) 10 MG tablet Take 10 mg by mouth at bedtime. 09/05/20   [provider]  aspirin 81 MG tablet Take 1 tablet (81 mg total) by mouth daily. 12/10/10   Larey Dresser, MD  atorvastatin (LIPITOR) 40 MG tablet Take 40 mg by mouth daily.    [provider]  carvedilol (COREG) 12.5 MG tablet Take 1 tablet (12.5 mg total) by mouth 2 (two) times daily. 10/09/20   Cantwell, Celeste C, PA-C  diclofenac Sodium (VOLTAREN) 1 % GEL SMARTSIG:2 Gel Topical 4 Times Daily 02/22/21   [provider]  doxycycline (VIBRAMYCIN) 100 MG capsule Take 1 capsule (100 mg total) by mouth 2 (two) times daily. One po bid x 7 days 07/06/21   Elnora Morrison, MD  guanFACINE (TENEX) 2 MG tablet Take 2 mg by mouth at bedtime.    [provider]  isosorbide mononitrate (IMDUR) 30 MG 24 hr tablet Take 1 tablet (30 mg total) by mouth daily. Do not take if top blood pressure number  (systolic blood pressure) is less than 100. 05/14/21 06/13/21  Cantwell, Celeste C, PA-C  levothyroxine (SYNTHROID) 100 MCG tablet Take 100 mcg by mouth daily.    [provider]  losartan (COZAAR) 25 MG tablet Take 1 tablet (25 mg total) by mouth every evening. 11/10/20 11/05/21  Adrian Prows, MD  meclizine (ANTIVERT) 12.5 MG tablet Take 1 tablet (12.5 mg total) by mouth 3 (three) times daily as needed for dizziness. 05/24/21   Cantwell, Celeste C, PA-C  multivitamin-iron-minerals-folic acid (CENTRUM) chewable tablet Chew 1 tablet by mouth daily.    [provider]  oxyCODONE-acetaminophen (PERCOCET/ROXICET) 5-325 MG tablet Take 1 tablet by mouth every 6 (six) hours as needed for severe pain. 07/10/21   Long, Wonda Olds, MD  predniSONE (STERAPRED UNI-PAK 21 TAB) 10 MG (21) TBPK tablet Take by mouth daily. Take 6 tabs by mouth daily  for 2 days, then 5 tabs for 2 days, then 4 tabs for 2 days, then 3 tabs for 2 days, 2 tabs for 2 days, then 1 tab by mouth daily for 2 days 07/10/21   Long, Wonda Olds, MD  senna-docusate (SENOKOT-S) 8.6-50 MG tablet Take 1 tablet by mouth at bedtime as needed for mild constipation. 07/10/21   Long, Wonda Olds, MD      Allergies    Amoxicillin and Clarithromycin    Review of Systems  Review of Systems  Constitutional:  Negative for fatigue and fever.  Respiratory:  Negative for chest tightness and shortness of breath.   Cardiovascular:  Negative for chest pain and leg swelling.  Musculoskeletal:  Positive for gait problem. Negative for joint swelling.  Neurological:  Negative for numbness.   Physical Exam Updated Vital Signs BP 137/79 (BP Location: Left Arm)    Pulse 68    Temp 98 F (36.7 C) (Oral)    Resp 18    Ht 5\' 5"  (1.651 m)    Wt 88.9 kg    SpO2 99%    BMI 32.62 kg/m  Physical Exam Constitutional:      Appearance: Normal appearance.  Musculoskeletal:        General: Swelling and tenderness present. No deformity or signs of injury.   Neurological:     General: No focal deficit present.     Mental Status: She is alert and oriented to person, place, and time.     Motor: No weakness.     Gait: Gait normal.    ED Results / Procedures / Treatments   Labs (all labs ordered are listed, but only abnormal results are displayed) Labs Reviewed - No data to display  EKG None  Radiology US Venous Img Lower  Left (DVT Study)  Result Date: 09/04/2021 CLINICAL DATA:  Left leg pain EXAM: LEFT LOWER EXTREMITY VENOUS DOPPLER ULTRASOUND TECHNIQUE: Gray-scale sonography with compression, as well as color and duplex ultrasound, were performed to evaluate the deep venous system(s) from the level of the common femoral vein through the popliteal and proximal calf veins. COMPARISON:  None. FINDINGS: VENOUS Normal compressibility of the common femoral, superficial femoral, and popliteal veins, as well as the visualized calf veins. Visualized portions of profunda femoral vein and great saphenous vein unremarkable. No filling defects to suggest DVT on grayscale or color Doppler imaging. Doppler waveforms show normal direction of venous flow, normal respiratory plasticity and response to augmentation. Limited views of the contralateral common femoral vein are unremarkable. OTHER None. Limitations: none IMPRESSION: Negative examination for deep venous thrombosis in the left lower extremity. Electronically Signed   By: Delanna Ahmadi M.D.   On: 09/04/2021 09:42    Procedures   Medications Ordered in ED Medications  oxyCODONE (Oxy IR/ROXICODONE) immediate release tablet 5 mg (5 mg Oral Given 09/04/21 4008)    ED Course/ Medical Decision Making/ A&P  [name] is a 81 y.o. female who presented to the ED with 3 days left foot pain that radiates upward to calf. No recent trauma, immobilization or surgery. Low suspicion for DVT given Wells score 0.  LLE doppler negative for DVT or Bakers Cyst.  Given pain over medial malleolus and achilles suspect  tibialis posterior syndrome as cause of pain. Oxycodone 5 mg for pain, given elevated Cr.    Will trial Oxycodone IR 5 mg q6 x 5 tabs, Voltaren gel QID, CAM boot until evaluated by Sports Medicine.   Patient to call for appointment for SM follow up  Advise patient to take stool softeners while on opioids                          Medical Decision Making  Final Clinical Impression(s) / ED Diagnoses Final diagnoses:  Tibialis posterior syndrome    Rx / DC Orders ED Discharge Orders          Ordered    oxyCODONE (ROXICODONE) 5 MG immediate release tablet  Every 8 hours PRN        09/04/21 1022    diclofenac Sodium (VOLTAREN) 1 % GEL  4 times daily        09/04/21 1022              Carollee Leitz, MD 09/04/21 1056    Long, Wonda Olds, MD 09/06/21 [phone]

## 2021-09-04 NOTE — ED Notes (Signed)
Ultrasound at bedside

## 2021-10-06 ENCOUNTER — Other Ambulatory Visit: Payer: Self-pay

## 2021-10-06 ENCOUNTER — Emergency Department (HOSPITAL_BASED_OUTPATIENT_CLINIC_OR_DEPARTMENT_OTHER)
Admission: EM | Admit: 2021-10-06 | Discharge: 2021-10-06 | Disposition: A | Discharge status: Home / Self Care | Payer: Medicare Other | Attending: Emergency Medicine | Admitting: Emergency Medicine

## 2021-10-06 ENCOUNTER — Encounter (HOSPITAL_BASED_OUTPATIENT_CLINIC_OR_DEPARTMENT_OTHER): Payer: Self-pay | Admitting: Emergency Medicine

## 2021-10-06 DIAGNOSIS — M10472 Other secondary gout, left ankle and foot: Secondary | ICD-10-CM | POA: Diagnosis not present

## 2021-10-06 DIAGNOSIS — M109 Gout, unspecified: Secondary | ICD-10-CM

## 2021-10-06 DIAGNOSIS — M25572 Pain in left ankle and joints of left foot: Secondary | ICD-10-CM | POA: Insufficient documentation

## 2021-10-06 DIAGNOSIS — M10072 Idiopathic gout, left ankle and foot: Secondary | ICD-10-CM | POA: Diagnosis not present

## 2021-10-06 DIAGNOSIS — G990 Autonomic neuropathy in diseases classified elsewhere: Secondary | ICD-10-CM | POA: Diagnosis not present

## 2021-10-06 MED ORDER — PREDNISONE 50 MG PO TABS
60.0000 mg | ORAL_TABLET | Freq: Once | ORAL | Status: AC
  Administered 2021-10-06: 60 mg via ORAL
  Filled 2021-10-06: qty 1

## 2021-10-06 MED ORDER — PREDNISONE 20 MG PO TABS: ORAL_TABLET | ORAL | 0 refills | Status: DC

## 2021-10-06 MED ORDER — LIDOCAINE 5 % EX PTCH
1.0000 | MEDICATED_PATCH | CUTANEOUS | Status: DC
  Administered 2021-10-06: 1 via TRANSDERMAL
  Filled 2021-10-06: qty 1

## 2021-10-06 MED ORDER — IBUPROFEN 800 MG PO TABS
800.0000 mg | ORAL_TABLET | Freq: Once | ORAL | Status: AC
  Administered 2021-10-06: 800 mg via ORAL
  Filled 2021-10-06: qty 1

## 2021-10-06 MED ORDER — LIDOCAINE 5 % EX PTCH: 1.0000 | MEDICATED_PATCH | CUTANEOUS | 0 refills | Status: AC

## 2021-10-06 NOTE — ED Provider Notes (Signed)
?[facility] EMERG[facility] DEPT ?Provider Note ? ? ?CSN: [id number] ?Arrival date & time: 10/06/21  0211 ? ?  ? ?History ? ?Chief Complaint  ?Patient presents with  ? Ankle Pain  ? ? ?[name] is a 81 y.o. female. ? ?The history is provided by the patient.  ?Ankle Pain ?Location:  Ankle ?Time since incident:  8 hours ?Injury: no   ?Ankle location:  L ankle ?Pain details:  ?  Quality:  Aching ?  Radiates to:  Does not radiate ?  Severity:  Moderate ?  Onset quality:  Sudden ?  Duration:  8 hours ?  Timing:  Constant ?  Progression:  Unchanged ?Chronicity:  New ?Prior injury to area:  No ?Relieved by:  Nothing ?Worsened by:  Nothing ?Ineffective treatments:  None tried ?Associated symptoms: swelling   ?Associated symptoms: no back pain, no fever and no stiffness   ?Risk factors: no concern for non-accidental trauma   ? ?  ? ?Home Medications ?Prior to Admission medications   ?Medication Sig Start Date End Date Taking? Authorizing Provider  ?lidocaine (LIDODERM) 5 % Place 1 patch onto the skin daily. Remove & Discard patch within 12 hours or as directed by MD 10/06/21  Yes Roschelle Calandra, MD  ?predniSONE (DELTASONE) 20 MG tablet 3 tabs po day one, then 2 po daily x 4 days 10/06/21  Yes Aden Youngman, MD  ?amLODipine (NORVASC) 10 MG tablet Take 10 mg by mouth at bedtime. 09/05/20   [provider]  ?aspirin 81 MG tablet Take 1 tablet (81 mg total) by mouth daily. 12/10/10   Larey Dresser, MD  ?atorvastatin (LIPITOR) 40 MG tablet Take 40 mg by mouth daily.    [provider]  ?carvedilol (COREG) 12.5 MG tablet Take 1 tablet (12.5 mg total) by mouth 2 (two) times daily. 10/09/20   Cantwell, Celeste C, PA-C  ?diclofenac Sodium (VOLTAREN) 1 % GEL SMARTSIG:2 Gel Topical 4 Times Daily 02/22/21   [provider]  ?diclofenac Sodium (VOLTAREN) 1 % GEL Apply 4 g topically 4 (four) times daily. 09/04/21   Carollee Leitz, MD  ?doxycycline (VIBRAMYCIN) 100 MG capsule Take 1 capsule (100 mg total)  by mouth 2 (two) times daily. One po bid x 7 days 07/06/21   Elnora Morrison, MD  ?guanFACINE (TENEX) 2 MG tablet Take 2 mg by mouth at bedtime.    [provider]  ?isosorbide mononitrate (IMDUR) 30 MG 24 hr tablet Take 1 tablet (30 mg total) by mouth daily. Do not take if top blood pressure number (systolic blood pressure) is less than 100. 05/14/21 06/13/21  Cantwell, Celeste C, PA-C  ?levothyroxine (SYNTHROID) 100 MCG tablet Take 100 mcg by mouth daily.    [provider]  ?losartan (COZAAR) 25 MG tablet Take 1 tablet (25 mg total) by mouth every evening. 11/10/20 11/05/21  Adrian Prows, MD  ?meclizine (ANTIVERT) 12.5 MG tablet Take 1 tablet (12.5 mg total) by mouth 3 (three) times daily as needed for dizziness. 05/24/21   Cantwell, Celeste C, PA-C  ?multivitamin-iron-minerals-folic acid (CENTRUM) chewable tablet Chew 1 tablet by mouth daily.    [provider]  ?oxyCODONE (ROXICODONE) 5 MG immediate release tablet Take 1 tablet (5 mg total) by mouth every 8 (eight) hours as needed. 09/04/21   Carollee Leitz, MD  ?oxyCODONE-acetaminophen (PERCOCET/ROXICET) 5-325 MG tablet Take 1 tablet by mouth every 6 (six) hours as needed for severe pain. 07/10/21   Long, Wonda Olds, MD  ?predniSONE (STERAPRED UNI-PAK 21 TAB)  10 MG (21) TBPK tablet Take by mouth daily. Take 6 tabs by mouth daily  for 2 days, then 5 tabs for 2 days, then 4 tabs for 2 days, then 3 tabs for 2 days, 2 tabs for 2 days, then 1 tab by mouth daily for 2 days 07/10/21   Long, Wonda Olds, MD  ?senna-docusate (SENOKOT-S) 8.6-50 MG tablet Take 1 tablet by mouth at bedtime as needed for mild constipation. 07/10/21   Long, Wonda Olds, MD  ?   ? ?Allergies    ?Amoxicillin and Clarithromycin   ? ?Review of Systems   ?Review of Systems  ?Constitutional:  Negative for fever.  ?HENT:  Negative for facial swelling.   ?Eyes:  Negative for redness.  ?Respiratory:  Negative for wheezing and stridor.   ?Cardiovascular:  Negative for chest pain.   ?Gastrointestinal:  Negative for abdominal pain.  ?Musculoskeletal:  Positive for arthralgias. Negative for back pain and stiffness.  ?Neurological:  Negative for facial asymmetry.  ?Psychiatric/Behavioral:  Negative for agitation.   ?All other systems reviewed and are negative. ? ?Physical Exam ?Updated Vital Signs ?BP (!) 146/78 (BP Location: Right Arm)   Pulse 69   Temp 98.8 ?F (37.1 ?C) (Oral)   Resp 20   Wt 88.9 kg   SpO2 97%   BMI 32.62 kg/m?  ?Physical Exam ?Vitals and nursing note reviewed.  ?Constitutional:   ?   General: She is not in acute distress. ?   Appearance: Normal appearance.  ?HENT:  ?   Head: Normocephalic and atraumatic.  ?   Nose: Nose normal.  ?Eyes:  ?   Conjunctiva/sclera: Conjunctivae normal.  ?   Pupils: Pupils are equal, round, and reactive to light.  ?Cardiovascular:  ?   Rate and Rhythm: Normal rate and regular rhythm.  ?   Pulses: Normal pulses.  ?   Heart sounds: Normal heart sounds.  ?Pulmonary:  ?   Effort: Pulmonary effort is normal.  ?   Breath sounds: Normal breath sounds.  ?Abdominal:  ?   General: Abdomen is flat. Bowel sounds are normal.  ?   Palpations: Abdomen is soft.  ?   Tenderness: There is no abdominal tenderness. There is no guarding.  ?Musculoskeletal:  ?   Cervical back: Normal range of motion and neck supple.  ?   Left ankle: Swelling present. No ecchymosis or lacerations. Normal range of motion. Anterior drawer test negative.  ?   Left Achilles Tendon: Normal.  ?   Comments: Swelling and warmth without erythema of the L ankle consistent with gout   ?Skin: ?   General: Skin is warm and dry.  ?   Capillary Refill: Capillary refill takes less than 2 seconds.  ?   Findings: No rash.  ?Neurological:  ?   General: No focal deficit present.  ?   Mental Status: She is alert and oriented to person, place, and time.  ?   Deep Tendon Reflexes: Reflexes normal.  ?Psychiatric:     ?   Mood and Affect: Mood normal.     ?   Behavior: Behavior normal.  ? ? ?ED Results /  Procedures / Treatments   ?Labs ?(all labs ordered are listed, but only abnormal results are displayed) ?Labs Reviewed - No data to display ? ?EKG ?None ? ?Radiology ?No results found. ? ?Procedures ?Procedures  ? ? ?Medications Ordered in ED ?Medications  ?lidocaine (LIDODERM) 5 % 1 patch (1 patch Transdermal Patch Applied 10/06/21 0235)  ?ibuprofen (ADVIL) tablet  800 mg (800 mg Oral Given 10/06/21 0235)  ?predniSONE (DELTASONE) tablet 60 mg (60 mg Oral Given 10/06/21 0235)  ? ? ?ED Course/ Medical Decision Making/ A&P ?  ?                        ?Medical Decision Making ?Swelling and warmth or L ankle in patient with known gout  ? ?Amount and/or Complexity of Data Reviewed ?Independent Historian: caregiver ?   Details: see above ? ?Risk ?Prescription drug management. ?Risk Details: Warmth and swelling without erythema.  Exam is consistent with gout. Will start prednisone to treat gout.  May additionally add tylenol for pain relief.  Stable for discharge with close follow up.   ? ? ? ?Final Clinical Impression(s) / ED Diagnoses ?Final diagnoses:  ?Acute gout of left ankle, unspecified cause  ? ?Return for intractable cough, coughing up blood, fevers > 100.4 unrelieved by medication, shortness of breath, intractable vomiting, chest pain, shortness of breath, weakness, numbness, changes in speech, facial asymmetry, abdominal pain, passing out, Inability to tolerate liquids or food, cough, altered mental status or any concerns. No signs of systemic illness or infection. The patient is nontoxic-appearing on exam and vital signs are within normal limits.  ?I have reviewed the triage vital signs and the nursing notes. Pertinent labs & imaging results that were available during my care of the patient were reviewed by me and considered in my medical decision making (see chart for details). After history, exam, and medical workup I feel the patient has been appropriately medically screened and is safe for discharge home.  Pertinent diagnoses were discussed with the patient. Patient was given return precautions. ?Rx / DC Orders ?ED Discharge Orders   ? ?      Ordered  ?  predniSONE (DELTASONE) 20 MG tablet       ? 10/06/21 0240  ?  lidocaine

## 2021-10-06 NOTE — ED Triage Notes (Signed)
Pt presents for L ankle pain that radiates into L calf without redness. Nonpitting edema to L ankle, cap refill< 3sec, pedal pulses 2+, ambulatoion difficult d/t pain. Patient has been treated for this pain before, told that it is nerve pain. ?Has taken tylenol at 11pm.  ?

## 2021-10-11 DIAGNOSIS — I10 Essential (primary) hypertension: Secondary | ICD-10-CM | POA: Diagnosis not present

## 2021-10-11 DIAGNOSIS — M1000 Idiopathic gout, unspecified site: Secondary | ICD-10-CM | POA: Diagnosis not present

## 2021-10-11 DIAGNOSIS — I739 Peripheral vascular disease, unspecified: Secondary | ICD-10-CM | POA: Diagnosis not present

## 2021-11-08 ENCOUNTER — Other Ambulatory Visit: Payer: Self-pay | Admitting: Cardiology

## 2021-11-08 DIAGNOSIS — I714 Abdominal aortic aneurysm, without rupture, unspecified: Secondary | ICD-10-CM

## 2021-11-08 DIAGNOSIS — I10 Essential (primary) hypertension: Secondary | ICD-10-CM

## 2021-11-20 NOTE — Progress Notes (Signed)
? ?Primary Physician/Referring:  Lucianne Lei, MD ? ?Patient ID: [name], female    DOB: [birth year], 81 y.o.   MRN: [id number] ? ?Chief Complaint  ?Patient presents with  ? Coronary Artery Disease  ? PAD  ? Follow-up  ?  6 month  ? ?HPI:   ? ?[name]  is a 81 y.o. AA female with history of hyperlipidemia, renovascular hypertension S/P renal stent in 1997, CAD S/P CABG in 1997, PAD with bilateral PTFE covered iliac stents in 2009, mild abdominal aortic ectasia noted on CT scan of the abdomen, PAD, sleep apnea but has not been able to tolerate CPAP.  Patient denies history of CKD or diabetes. ? ?Patient underwent left heart catheterization coronary angiography 05/01/2021 which noted moderate CAD with proximal to mid LAD 80% stenosis, patent grafts.  She was subsequently evaluated in the ED later that month with a CT of the neck which noted >75% stenosis of the left internal carotid artery for which she was referred to vascular surgery. ? ?Patient presents for 47-monthfollow-up. At last visit no changes were made.  Patient continues to feel well overall.  She continues to work.  She walks a few days per week without issue.  Denies chest pain, significant dyspnea.  She does have mild bilateral leg edema which she states is worse by the end of the day and improves overnight.  She has appointment to see vascular surgery later this month.  Blood pressure is elevated in the office today, however it is within acceptable range given that it is well controlled on home monitoring. ? ?Past Medical History:  ?Diagnosis Date  ? Breast cancer ([facility]   ? s/p mastectomy  ? CAD (coronary artery disease)   ? s/p CABG  ? DDD (degenerative disc disease), lumbar   ? Discoid lupus   ? Dyspnea   ? GERD (gastroesophageal reflux disease)   ? Gout   ? HLD (hyperlipidemia)   ? HTN (hypertension)   ? Hypothyroidism   ? Low back pain   ? Obesity   ? OSA (obstructive sleep apnea)   ? did not tolerate CPAP  ? Osteoporosis   ? [place] [place]   ? Renal disorder   ? ?Past Surgical History:  ?Procedure Laterality Date  ? ANKLE SURGERY  7/05  ? left  ? CORONARY ANGIOPLASTY WITH STENT PLACEMENT  2003  ? 7 total stents placed  ? CORONARY ARTERY BYPASS GRAFT  1997  ? ELBOW SURGERY    ? right  ? hysterectomy - unknown type    ? LEFT HEART CATH AND CORONARY ANGIOGRAPHY N/A 05/01/2021  ? Procedure: LEFT HEART CATH AND CORONARY ANGIOGRAPHY;  Surgeon: PNigel Mormon MD;  [facility];  Service: Cardiovascular;  Laterality: N/A;  ? MASTECTOMY  1985  ? right  ? OOPHORECTOMY    ? RENAL ARTERY STENT    ? ?Family History  ?Problem Relation Age of Onset  ? Coronary artery disease Other   ? Diabetes Other   ? Hypertension Other   ? Aneurysm Other   ? Stroke Other   ? Aneurysm Mother   ? Alzheimer's disease Father   ? Hypertension Sister   ? Hyperlipidemia Sister   ? Suicidality Brother   ? Hypertension Sister   ? Hyperlipidemia Sister   ? Hypertension Sister   ? Hyperlipidemia Sister   ? Drug abuse Son   ?  ?Social History  ? ?Tobacco Use  ?  Smoking status: Former  ?  Types: Cigarettes  ?  Quit date: 07/23/1995  ?  Years since quitting: 26.3  ? Smokeless tobacco: Never  ?Substance Use Topics  ? Alcohol use: No  ? ?Marital Status: Widowed  ?ROS  ?Review of Systems  ?Cardiovascular:  Positive for dyspnea on exertion (mild) and leg swelling (intermittent, mild). Negative for chest pain, claudication, near-syncope, orthopnea, palpitations, paroxysmal nocturnal dyspnea and syncope.  ?Objective  ?Blood pressure (!) 148/72, pulse 64, temperature 97.7 ?F (36.5 ?C), resp. rate 17, height '5\' 5"'  (1.651 m), weight 198 lb (89.8 kg), SpO2 97 %.  ? ?  11/21/2021  ?  8:30 AM 10/06/2021  ?  2:30 AM 10/06/2021  ?  2:22 AM  ?Vitals with BMI  ?Height '5\' 5"'     ?Weight 198 lbs    ?BMI 32.95    ?Systolic 497 026 378  ?Diastolic 72 55 78  ?Pulse 64 59 69  ?  ? Physical Exam ?Vitals reviewed.  ?Cardiovascular:  ?   Rate and Rhythm: Normal rate  and regular rhythm.  ?   Pulses: Intact distal pulses.     ?     Carotid pulses are 2+ on the right side and 2+ on the left side. ?     Radial pulses are 2+ on the right side and 2+ on the left side.  ?     Femoral pulses are 1+ on the right side with bruit and 2+ on the left side with bruit. ?     Popliteal pulses are 0 on the right side and 0 on the left side.  ?     Dorsalis pedis pulses are 0 on the right side and 0 on the left side.  ?     Posterior tibial pulses are 1+ on the right side and 1+ on the left side.  ?   Heart sounds: Normal heart sounds. No murmur heard. ?  No gallop.  ?Pulmonary:  ?   Effort: Pulmonary effort is normal. No respiratory distress.  ?   Breath sounds: No wheezing, rhonchi or rales.  ?Musculoskeletal:  ?   Right lower leg: Edema (1+ ankle) present.  ?   Left lower leg: Edema (1+ ankle) present.  ?Neurological:  ?   Mental Status: She is alert.  ? ?Laboratory examination:  ? ? ?  Latest Ref Rng & Units 07/06/2021  ? 11:10 AM 05/06/2021  ?  1:15 AM 05/01/2021  ? 12:22 PM  ?CMP  ?Glucose 70 - 99 mg/dL 95   111   114    ?BUN 8 - 23 mg/dL '17   24   19    ' ?Creatinine 0.44 - 1.00 mg/dL 1.15   1.28   1.30    ?Sodium 135 - 145 mmol/L 139   139   137    ?Potassium 3.5 - 5.1 mmol/L 3.5   2.9   4.1    ?Chloride 98 - 111 mmol/L 103   103   103    ?CO2 22 - 32 mmol/L '27   28   25    ' ?Calcium 8.9 - 10.3 mg/dL 10.7   10.5   10.1    ?Total Protein 6.5 - 8.1 g/dL  7.4     ?Total Bilirubin 0.3 - 1.2 mg/dL  1.1     ?Alkaline Phos 38 - 126 U/L  75     ?AST 15 - 41 U/L  29     ?ALT 0 - 44 U/L  20     ? ? ?  Latest Ref Rng & Units 07/06/2021  ? 11:10 AM 05/06/2021  ?  1:15 AM 05/01/2021  ? 12:22 PM  ?CBC  ?WBC 4.0 - 10.5 K/uL 9.7   7.1   6.4    ?Hemoglobin 12.0 - 15.0 g/dL 12.4   12.1   12.2    ?Hematocrit 36.0 - 46.0 % 38.0   36.9   38.6    ?Platelets 150 - 400 K/uL 212   198   205    ? ?Lipid Panel  ?   ?Component Value Date/Time  ? CHOL 107 12/10/2010 0910  ? TRIG 93.0 12/10/2010 0910  ? HDL 37.40 (L)  12/10/2010 0910  ? CHOLHDL 3 12/10/2010 0910  ? VLDL 18.6 12/10/2010 0910  ? LDLCALC 51 12/10/2010 0910  ? ?HEMOGLOBIN A1C ?No results found for: HGBA1C, MPG ?TSH ?No results for input(s): TSH in the last 8760 hours. ? ?Troponin 03/19/2021: 18 --> 17 ? ?External labs:  ?09/19/2020: ? ?Total cholesterol 112, triglycerides 131, HDL 33, LDL 58. ? ?Serum glucose 95 mg, BUN 18, creatinine 1.13, EGFR 53 mL, potassium 3.7, sodium 141. ? ?A1c 6.0%. ? ?TSH markedly reduced at 0.02.  Normal T3 and T4. ? ?Hb 12.6/HCT 38.5, platelets 230.  ?Allergies  ? ?Allergies  ?Allergen Reactions  ? Amoxicillin Swelling  ? Clarithromycin Swelling  ?  ?Medications Prior to Visit:  ? ?Outpatient Medications Prior to Visit  ?Medication Sig Dispense Refill  ? amLODipine (NORVASC) 10 MG tablet Take 10 mg by mouth at bedtime.    ? aspirin 81 MG tablet Take 1 tablet (81 mg total) by mouth daily. 30 tablet 6  ? atorvastatin (LIPITOR) 40 MG tablet Take 40 mg by mouth daily.    ? carvedilol (COREG) 12.5 MG tablet Take 1 tablet (12.5 mg total) by mouth 2 (two) times daily. 180 tablet 3  ? diclofenac Sodium (VOLTAREN) 1 % GEL SMARTSIG:2 Gel Topical 4 Times Daily    ? diclofenac Sodium (VOLTAREN) 1 % GEL Apply 4 g topically 4 (four) times daily. 50 g 0  ? guanFACINE (TENEX) 2 MG tablet Take 2 mg by mouth at bedtime.    ? levothyroxine (SYNTHROID) 100 MCG tablet Take 100 mcg by mouth daily.    ? lidocaine (LIDODERM) 5 % Place 1 patch onto the skin daily. Remove & Discard patch within 12 hours or as directed by MD 30 patch 0  ? losartan (COZAAR) 25 MG tablet Take 1 tablet by mouth in the evening 90 tablet 0  ? multivitamin-iron-minerals-folic acid (CENTRUM) chewable tablet Chew 1 tablet by mouth daily.    ? doxycycline (VIBRAMYCIN) 100 MG capsule Take 1 capsule (100 mg total) by mouth 2 (two) times daily. One po bid x 7 days 14 capsule 0  ? isosorbide mononitrate (IMDUR) 30 MG 24 hr tablet Take 1 tablet (30 mg total) by mouth daily. Do not take if top  blood pressure number (systolic blood pressure) is less than 100. 90 tablet 0  ? meclizine (ANTIVERT) 12.5 MG tablet Take 1 tablet (12.5 mg total) by mouth 3 (three) times daily as needed for dizziness. 21 tablet

## 2021-11-21 ENCOUNTER — Ambulatory Visit: Payer: Medicare Other | Admitting: Student

## 2021-11-21 ENCOUNTER — Encounter: Payer: Self-pay | Admitting: Student

## 2021-11-21 VITALS — BP 148/72 | HR 64 | Temp 97.7°F | Resp 17 | Ht 65.0 in | Wt 198.0 lb

## 2021-11-21 DIAGNOSIS — I10 Essential (primary) hypertension: Secondary | ICD-10-CM

## 2021-11-21 DIAGNOSIS — I739 Peripheral vascular disease, unspecified: Secondary | ICD-10-CM | POA: Diagnosis not present

## 2021-11-21 DIAGNOSIS — I25810 Atherosclerosis of coronary artery bypass graft(s) without angina pectoris: Secondary | ICD-10-CM | POA: Diagnosis not present

## 2021-11-21 DIAGNOSIS — I6522 Occlusion and stenosis of left carotid artery: Secondary | ICD-10-CM

## 2021-11-22 LAB — LIPID PANEL WITH LDL/HDL RATIO
Cholesterol, Total: 115 mg/dL (ref 100–199)
HDL: 36 mg/dL — ABNORMAL LOW (ref >39)
LDL Chol Calc (NIH): 61 mg/dL (ref 0–99)
LDL/HDL Ratio: 1.7 ratio (ref 0.0–3.2)
Triglycerides: 94 mg/dL (ref 0–149)
VLDL Cholesterol Cal: 18 mg/dL (ref 5–40)

## 2021-11-22 NOTE — Progress Notes (Signed)
Called and spoke with patient regarding her recent lab results.

## 2021-11-22 NOTE — Progress Notes (Signed)
Called pt no answer, could not leave a vm.

## 2021-11-23 ENCOUNTER — Encounter (HOSPITAL_COMMUNITY): Payer: Medicare Other

## 2021-11-23 ENCOUNTER — Encounter: Payer: Medicare Other | Admitting: Vascular Surgery

## 2021-11-26 ENCOUNTER — Other Ambulatory Visit: Payer: Self-pay

## 2021-11-26 DIAGNOSIS — I6523 Occlusion and stenosis of bilateral carotid arteries: Secondary | ICD-10-CM

## 2021-12-06 ENCOUNTER — Encounter: Payer: Self-pay | Admitting: Vascular Surgery

## 2021-12-06 ENCOUNTER — Ambulatory Visit: Payer: Medicare Other | Admitting: Vascular Surgery

## 2021-12-06 ENCOUNTER — Ambulatory Visit (HOSPITAL_COMMUNITY)
Admission: RE | Admit: 2021-12-06 | Discharge: 2021-12-06 | Disposition: A | Discharge status: Home / Self Care | Payer: Medicare Other | Source: Ambulatory Visit | Attending: Vascular Surgery | Admitting: Vascular Surgery

## 2021-12-06 VITALS — BP 165/81 | HR 55 | Temp 97.9°F | Resp 20 | Ht 65.0 in | Wt 194.0 lb

## 2021-12-06 DIAGNOSIS — I6522 Occlusion and stenosis of left carotid artery: Secondary | ICD-10-CM

## 2021-12-06 DIAGNOSIS — I6523 Occlusion and stenosis of bilateral carotid arteries: Secondary | ICD-10-CM | POA: Diagnosis not present

## 2021-12-06 NOTE — Progress Notes (Signed)
ASSESSMENT & PLAN   LEFT CAROTID STENOSIS: This patient has an asymptomatic focal calcific plaque in the proximal left internal carotid artery based on the CT angiogram that was done on 05/06/2021.  However, this is not producing a significant stenosis.  Her carotid duplex scan today shows no evidence of significant stenosis bilaterally.  Given the plaque in the left carotid I think it would be reasonable to get a follow-up carotid duplex scan in 1 year and I have ordered that test.  I have explained that we would not consider carotid endarterectomy less the stenosis progressed to greater than 80% or she develop new neurologic symptoms.  We have reviewed the symptoms of cerebrovascular disease.  If her duplex scan looks good in 1 year we could probably stretch her follow-up out to 18 months.  She is on aspirin and is on a statin.  REASON FOR CONSULT:    Stenosis of the left carotid artery.  The consult is requested by Dr. Lucianne Lei.   HPI:   [name] is a 81 y.o. female who was referred with carotid disease.  I have reviewed the records from the referring office.  The patient was seen on 08/28/2021.  The patient had apparently been having some problems with difficulty concentrating and this prompted the MRI.  The results are discussed below but this was unremarkable.  In reviewing some old records it was noted that the patient had a 75% left carotid stenosis.  It looks like the patient had a CT angio of the head and neck to work-up vertigo.  This showed significant large vessel occlusion or stenosis in the intracranial arteries.  The patient was noted to have a calcific plaque in the proximal left ICA with a 75% stenosis.  On my history, the patient denies any previous history of stroke, TIAs, expressive or receptive aphasia, or amaurosis fugax.  She denies any history of claudication, rest pain, or nonhealing ulcers.  Her risk factors for peripheral arterial disease include hypertension,  hypercholesterolemia, and a remote history of tobacco use.  She denies any history of diabetes or family history of premature cardiovascular disease.  She does have a history of coronary artery disease.  She has had no recent chest pain or chest pressure.  She is on aspirin and is on a statin  Past Medical History:  Diagnosis Date   Breast cancer ([place])    s/p mastectomy   CAD (coronary artery disease)    s/p CABG   DDD (degenerative disc disease), lumbar    Discoid lupus    Dyspnea    GERD (gastroesophageal reflux disease)    Gout    HLD (hyperlipidemia)    HTN (hypertension)    Hypothyroidism    Low back pain    Obesity    OSA (obstructive sleep apnea)    did not tolerate CPAP   Osteoporosis    PVD (peripheral vascular disease) ([place])    Renal disorder     Family History  Problem Relation Age of Onset   Coronary artery disease Other    Diabetes Other    Hypertension Other    Aneurysm Other    Stroke Other    Aneurysm Mother    Alzheimer's disease Father    Hypertension Sister    Hyperlipidemia Sister    Suicidality Brother    Hypertension Sister    Hyperlipidemia Sister    Hypertension Sister    Hyperlipidemia Sister    Drug abuse Son  SOCIAL HISTORY: Social History   Tobacco Use   Smoking status: Former    Types: Cigarettes    Quit date: 07/23/1995    Years since quitting: 26.3   Smokeless tobacco: Never  Substance Use Topics   Alcohol use: No    Allergies  Allergen Reactions   Amoxicillin Swelling   Clarithromycin Swelling    Current Outpatient Medications  Medication Sig Dispense Refill   amLODipine (NORVASC) 10 MG tablet Take 10 mg by mouth at bedtime.     aspirin 81 MG tablet Take 1 tablet (81 mg total) by mouth daily. 30 tablet 6   atorvastatin (LIPITOR) 40 MG tablet Take 40 mg by mouth daily.     carvedilol (COREG) 12.5 MG tablet Take 1 tablet (12.5 mg total) by mouth 2 (two) times daily. 180 tablet 3   diclofenac Sodium (VOLTAREN) 1 %  GEL SMARTSIG:2 Gel Topical 4 Times Daily     diclofenac Sodium (VOLTAREN) 1 % GEL Apply 4 g topically 4 (four) times daily. 50 g 0   guanFACINE (TENEX) 2 MG tablet Take 2 mg by mouth at bedtime.     levothyroxine (SYNTHROID) 100 MCG tablet Take 100 mcg by mouth daily.     lidocaine (LIDODERM) 5 % Place 1 patch onto the skin daily. Remove & Discard patch within 12 hours or as directed by MD 30 patch 0   losartan (COZAAR) 25 MG tablet Take 1 tablet by mouth in the evening 90 tablet 0   multivitamin-iron-minerals-folic acid (CENTRUM) chewable tablet Chew 1 tablet by mouth daily.     No current facility-administered medications for this visit.    REVIEW OF SYSTEMS:  '[X]'$  denotes positive finding, '[ ]'$  denotes negative finding Cardiac  Comments:  Chest pain or chest pressure:    Shortness of breath upon exertion: x   Short of breath when lying flat:    Irregular heart rhythm: x       Vascular    Pain in calf, thigh, or hip brought on by ambulation:    Pain in feet at night that wakes you up from your sleep:     Blood clot in your veins:    Leg swelling:         Pulmonary    Oxygen at home:    Productive cough:     Wheezing:         Neurologic    Sudden weakness in arms or legs:     Sudden numbness in arms or legs:     Sudden onset of difficulty speaking or slurred speech:    Temporary loss of vision in one eye:     Problems with dizziness:         Gastrointestinal    Blood in stool:     Vomited blood:         Genitourinary    Burning when urinating:     Blood in urine:        Psychiatric    Major depression:         Hematologic    Bleeding problems:    Problems with blood clotting too easily:        Skin    Rashes or ulcers:        Constitutional    Fever or chills:    -  PHYSICAL EXAM:   Vitals:   12/06/21 1316 12/06/21 1318  BP: (!) 177/70 (!) 165/81  Pulse: (!) 55   Resp: 20   Temp: 97.9  F (36.6 C)   SpO2: 95%   Weight: 194 lb (88 kg)   Height: '5\' 5"'$   (1.651 m)    Body mass index is 32.28 kg/m. GENERAL: The patient is a well-nourished female, in no acute distress. The vital signs are documented above. CARDIAC: There is a regular rate and rhythm.  VASCULAR: I do not detect carotid bruits. On the right side she has a palpable femoral pulse.  I cannot palpate pedal pulses.  She has a monophasic dorsalis pedis signal with a biphasic posterior tibial signal. On the left side she has a palpable femoral pulse.  I can palpate a dorsalis pedis pulse but no posterior tibial pulse.  She does have a biphasic posterior tibial signal with the Doppler and a monophasic dorsalis pedis signal. She has mild bilateral lower extremity swelling. PULMONARY: There is good air exchange bilaterally without wheezing or rales. ABDOMEN: Soft and non-tender with normal pitched bowel sounds.  I do not palpate an aneurysm. MUSCULOSKELETAL: There are no major deformities. NEUROLOGIC: No focal weakness or paresthesias are detected. SKIN: There are no ulcers or rashes noted. PSYCHIATRIC: The patient has a normal affect.  DATA:    CAROTID DUPLEX: I have independently interpreted her carotid duplex scan today.  On the right side there is a less than 39% stenosis.  The right vertebral artery is patent with antegrade flow.  There is no evidence of stenosis in the right subclavian artery.  On the left side there is a less than 39% stenosis.  The left vertebral artery is patent with antegrade flow.  There is no evidence of left subclavian artery stenosis.  MR BRAIN: I reviewed the MRI of the brain that was done on 08/17/2021.  This showed no intracranial metastatic disease.  The patient has a history of breast carcinoma.  Deitra [facility]

## 2021-12-07 ENCOUNTER — Other Ambulatory Visit: Payer: Self-pay | Admitting: Student

## 2021-12-07 DIAGNOSIS — I25810 Atherosclerosis of coronary artery bypass graft(s) without angina pectoris: Secondary | ICD-10-CM

## 2021-12-11 DIAGNOSIS — I119 Hypertensive heart disease without heart failure: Secondary | ICD-10-CM | POA: Diagnosis not present

## 2022-01-11 DIAGNOSIS — E782 Mixed hyperlipidemia: Secondary | ICD-10-CM | POA: Diagnosis not present

## 2022-01-11 DIAGNOSIS — I119 Hypertensive heart disease without heart failure: Secondary | ICD-10-CM | POA: Diagnosis not present

## 2022-01-11 DIAGNOSIS — R609 Edema, unspecified: Secondary | ICD-10-CM | POA: Diagnosis not present

## 2022-01-11 DIAGNOSIS — Z0000 Encounter for general adult medical examination without abnormal findings: Secondary | ICD-10-CM | POA: Diagnosis not present

## 2022-01-18 DIAGNOSIS — Z1231 Encounter for screening mammogram for malignant neoplasm of breast: Secondary | ICD-10-CM | POA: Diagnosis not present

## 2022-01-30 ENCOUNTER — Other Ambulatory Visit: Payer: Self-pay | Admitting: Cardiology

## 2022-01-30 DIAGNOSIS — I714 Abdominal aortic aneurysm, without rupture, unspecified: Secondary | ICD-10-CM

## 2022-01-30 DIAGNOSIS — I10 Essential (primary) hypertension: Secondary | ICD-10-CM

## 2022-02-12 DIAGNOSIS — U071 COVID-19: Secondary | ICD-10-CM | POA: Diagnosis not present

## 2022-02-28 DIAGNOSIS — H16223 Keratoconjunctivitis sicca, not specified as Sjogren's, bilateral: Secondary | ICD-10-CM | POA: Diagnosis not present

## 2022-03-21 DIAGNOSIS — J069 Acute upper respiratory infection, unspecified: Secondary | ICD-10-CM | POA: Diagnosis not present

## 2022-03-21 DIAGNOSIS — R051 Acute cough: Secondary | ICD-10-CM | POA: Diagnosis not present

## 2022-03-21 DIAGNOSIS — Z03818 Encounter for observation for suspected exposure to other biological agents ruled out: Secondary | ICD-10-CM | POA: Diagnosis not present

## 2022-04-13 ENCOUNTER — Other Ambulatory Visit: Payer: Self-pay | Admitting: Cardiology

## 2022-04-13 DIAGNOSIS — I714 Abdominal aortic aneurysm, without rupture, unspecified: Secondary | ICD-10-CM

## 2022-04-13 DIAGNOSIS — I10 Essential (primary) hypertension: Secondary | ICD-10-CM

## 2022-05-01 DIAGNOSIS — I10 Essential (primary) hypertension: Secondary | ICD-10-CM | POA: Diagnosis not present

## 2022-05-01 DIAGNOSIS — E790 Hyperuricemia without signs of inflammatory arthritis and tophaceous disease: Secondary | ICD-10-CM | POA: Diagnosis not present

## 2022-05-01 DIAGNOSIS — E039 Hypothyroidism, unspecified: Secondary | ICD-10-CM | POA: Diagnosis not present

## 2022-05-01 DIAGNOSIS — E785 Hyperlipidemia, unspecified: Secondary | ICD-10-CM | POA: Diagnosis not present

## 2022-05-01 DIAGNOSIS — I119 Hypertensive heart disease without heart failure: Secondary | ICD-10-CM | POA: Diagnosis not present

## 2022-05-22 ENCOUNTER — Ambulatory Visit: Payer: Medicare Other | Admitting: Student

## 2022-07-12 ENCOUNTER — Emergency Department (HOSPITAL_COMMUNITY)
Admission: EM | Admit: 2022-07-12 | Discharge: 2022-07-12 | Disposition: A | Discharge status: Home / Self Care | Payer: Medicare Other | Attending: Emergency Medicine | Admitting: Emergency Medicine

## 2022-07-12 ENCOUNTER — Other Ambulatory Visit: Payer: Self-pay

## 2022-07-12 ENCOUNTER — Emergency Department (HOSPITAL_COMMUNITY): Payer: Medicare Other

## 2022-07-12 DIAGNOSIS — I2510 Atherosclerotic heart disease of native coronary artery without angina pectoris: Secondary | ICD-10-CM | POA: Insufficient documentation

## 2022-07-12 DIAGNOSIS — Z7982 Long term (current) use of aspirin: Secondary | ICD-10-CM | POA: Diagnosis not present

## 2022-07-12 DIAGNOSIS — I10 Essential (primary) hypertension: Secondary | ICD-10-CM | POA: Diagnosis not present

## 2022-07-12 DIAGNOSIS — Y92410 Unspecified street and highway as the place of occurrence of the external cause: Secondary | ICD-10-CM | POA: Diagnosis not present

## 2022-07-12 DIAGNOSIS — Z79899 Other long term (current) drug therapy: Secondary | ICD-10-CM | POA: Diagnosis not present

## 2022-07-12 DIAGNOSIS — E039 Hypothyroidism, unspecified: Secondary | ICD-10-CM | POA: Diagnosis not present

## 2022-07-12 DIAGNOSIS — M545 Low back pain, unspecified: Secondary | ICD-10-CM | POA: Diagnosis not present

## 2022-07-12 DIAGNOSIS — Z955 Presence of coronary angioplasty implant and graft: Secondary | ICD-10-CM | POA: Diagnosis not present

## 2022-07-12 DIAGNOSIS — M5459 Other low back pain: Secondary | ICD-10-CM | POA: Diagnosis not present

## 2022-07-12 MED ORDER — ACETAMINOPHEN 500 MG PO TABS
1000.0000 mg | ORAL_TABLET | Freq: Once | ORAL | Status: AC
  Administered 2022-07-12: 1000 mg via ORAL
  Filled 2022-07-12: qty 2

## 2022-07-12 NOTE — Discharge Instructions (Signed)
Please take tylenol/ibuprofen for pain. I recommend close follow-up with PCP for reevaluation.  Please do not hesitate to return to emergency department if worrisome signs symptoms we discussed become apparent.
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
## 2022-07-30 DIAGNOSIS — E785 Hyperlipidemia, unspecified: Secondary | ICD-10-CM | POA: Diagnosis not present

## 2022-07-30 DIAGNOSIS — S0031XA Abrasion of nose, initial encounter: Secondary | ICD-10-CM | POA: Diagnosis not present

## 2022-07-30 DIAGNOSIS — M545 Low back pain, unspecified: Secondary | ICD-10-CM | POA: Diagnosis not present

## 2022-07-30 DIAGNOSIS — N183 Chronic kidney disease, stage 3 unspecified: Secondary | ICD-10-CM | POA: Diagnosis not present

## 2022-07-30 DIAGNOSIS — I10 Essential (primary) hypertension: Secondary | ICD-10-CM | POA: Diagnosis not present

## 2022-08-06 ENCOUNTER — Ambulatory Visit: Payer: Medicare Other | Admitting: Physical Therapy

## 2022-08-12 ENCOUNTER — Ambulatory Visit: Payer: Medicare Other | Attending: Family Medicine | Admitting: Physical Therapy

## 2022-08-12 DIAGNOSIS — M6281 Muscle weakness (generalized): Secondary | ICD-10-CM | POA: Insufficient documentation

## 2022-08-12 DIAGNOSIS — M5459 Other low back pain: Secondary | ICD-10-CM | POA: Diagnosis not present

## 2022-08-12 DIAGNOSIS — M25572 Pain in left ankle and joints of left foot: Secondary | ICD-10-CM | POA: Insufficient documentation

## 2022-08-12 DIAGNOSIS — R2689 Other abnormalities of gait and mobility: Secondary | ICD-10-CM | POA: Diagnosis not present
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
## 2022-08-19 ENCOUNTER — Ambulatory Visit: Payer: Medicare Other | Admitting: Physical Therapy

## 2022-08-26 ENCOUNTER — Ambulatory Visit: Payer: Medicare Other | Attending: Family Medicine | Admitting: Physical Therapy

## 2022-08-26 DIAGNOSIS — M6281 Muscle weakness (generalized): Secondary | ICD-10-CM | POA: Insufficient documentation

## 2022-08-26 DIAGNOSIS — R2689 Other abnormalities of gait and mobility: Secondary | ICD-10-CM | POA: Insufficient documentation

## 2022-08-26 DIAGNOSIS — M25572 Pain in left ankle and joints of left foot: Secondary | ICD-10-CM | POA: Diagnosis not present

## 2022-08-26 DIAGNOSIS — M5459 Other low back pain: Secondary | ICD-10-CM | POA: Insufficient documentation
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
## 2022-09-02 ENCOUNTER — Ambulatory Visit: Payer: Medicare Other | Admitting: Physical Therapy

## 2022-09-02 ENCOUNTER — Encounter: Payer: Self-pay | Admitting: Physical Therapy

## 2022-09-02 DIAGNOSIS — M5459 Other low back pain: Secondary | ICD-10-CM

## 2022-09-02 DIAGNOSIS — M25572 Pain in left ankle and joints of left foot: Secondary | ICD-10-CM | POA: Diagnosis not present

## 2022-09-02 DIAGNOSIS — R2689 Other abnormalities of gait and mobility: Secondary | ICD-10-CM | POA: Diagnosis not present

## 2022-09-02 DIAGNOSIS — M6281 Muscle weakness (generalized): Secondary | ICD-10-CM | POA: Diagnosis not present
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
## 2022-09-09 ENCOUNTER — Ambulatory Visit: Payer: Medicare Other | Admitting: Physical Therapy

## 2022-09-09 DIAGNOSIS — M5459 Other low back pain: Secondary | ICD-10-CM | POA: Diagnosis not present

## 2022-09-09 DIAGNOSIS — M25572 Pain in left ankle and joints of left foot: Secondary | ICD-10-CM | POA: Diagnosis not present

## 2022-09-09 DIAGNOSIS — M6281 Muscle weakness (generalized): Secondary | ICD-10-CM

## 2022-09-09 DIAGNOSIS — R2689 Other abnormalities of gait and mobility: Secondary | ICD-10-CM | POA: Diagnosis not present
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
## 2022-11-29 DIAGNOSIS — I119 Hypertensive heart disease without heart failure: Secondary | ICD-10-CM | POA: Diagnosis not present

## 2022-11-29 DIAGNOSIS — E782 Mixed hyperlipidemia: Secondary | ICD-10-CM | POA: Diagnosis not present

## 2022-11-29 DIAGNOSIS — I739 Peripheral vascular disease, unspecified: Secondary | ICD-10-CM | POA: Diagnosis not present

## 2022-11-29 DIAGNOSIS — R0600 Dyspnea, unspecified: Secondary | ICD-10-CM | POA: Diagnosis not present

## 2022-11-29 DIAGNOSIS — E039 Hypothyroidism, unspecified: Secondary | ICD-10-CM | POA: Diagnosis not present

## 2022-11-29 DIAGNOSIS — R600 Localized edema: Secondary | ICD-10-CM | POA: Diagnosis not present

## 2022-11-29 DIAGNOSIS — R7301 Impaired fasting glucose: Secondary | ICD-10-CM | POA: Diagnosis not present

## 2022-11-29 DIAGNOSIS — E789 Disorder of lipoprotein metabolism, unspecified: Secondary | ICD-10-CM | POA: Diagnosis not present

## 2022-11-29 DIAGNOSIS — I10 Essential (primary) hypertension: Secondary | ICD-10-CM | POA: Diagnosis not present

## 2022-12-06 DIAGNOSIS — Z0001 Encounter for general adult medical examination with abnormal findings: Secondary | ICD-10-CM | POA: Diagnosis not present

## 2022-12-06 DIAGNOSIS — I10 Essential (primary) hypertension: Secondary | ICD-10-CM | POA: Diagnosis not present

## 2022-12-06 DIAGNOSIS — I739 Peripheral vascular disease, unspecified: Secondary | ICD-10-CM | POA: Diagnosis not present

## 2022-12-06 DIAGNOSIS — I25810 Atherosclerosis of coronary artery bypass graft(s) without angina pectoris: Secondary | ICD-10-CM | POA: Diagnosis not present

## 2022-12-06 DIAGNOSIS — E1169 Type 2 diabetes mellitus with other specified complication: Secondary | ICD-10-CM | POA: Diagnosis not present

## 2022-12-06 DIAGNOSIS — E785 Hyperlipidemia, unspecified: Secondary | ICD-10-CM | POA: Diagnosis not present

## 2022-12-06 DIAGNOSIS — R609 Edema, unspecified: Secondary | ICD-10-CM | POA: Diagnosis not present

## 2022-12-23 DIAGNOSIS — E039 Hypothyroidism, unspecified: Secondary | ICD-10-CM | POA: Diagnosis not present

## 2022-12-23 DIAGNOSIS — I119 Hypertensive heart disease without heart failure: Secondary | ICD-10-CM | POA: Diagnosis not present

## 2022-12-23 DIAGNOSIS — E1169 Type 2 diabetes mellitus with other specified complication: Secondary | ICD-10-CM | POA: Diagnosis not present

## 2022-12-23 DIAGNOSIS — E785 Hyperlipidemia, unspecified: Secondary | ICD-10-CM | POA: Diagnosis not present

## 2022-12-23 DIAGNOSIS — I739 Peripheral vascular disease, unspecified: Secondary | ICD-10-CM | POA: Diagnosis not present

## 2023-01-02 DIAGNOSIS — E1169 Type 2 diabetes mellitus with other specified complication: Secondary | ICD-10-CM | POA: Diagnosis not present

## 2023-01-02 DIAGNOSIS — I119 Hypertensive heart disease without heart failure: Secondary | ICD-10-CM | POA: Diagnosis not present

## 2023-01-07 ENCOUNTER — Encounter: Payer: Self-pay | Admitting: Cardiology

## 2023-01-07 ENCOUNTER — Ambulatory Visit: Payer: Medicare Other | Admitting: Cardiology

## 2023-01-07 VITALS — BP 130/64 | HR 63 | Ht 65.0 in | Wt 194.0 lb

## 2023-01-07 DIAGNOSIS — I25118 Atherosclerotic heart disease of native coronary artery with other forms of angina pectoris: Secondary | ICD-10-CM | POA: Diagnosis not present

## 2023-01-07 DIAGNOSIS — I5033 Acute on chronic diastolic (congestive) heart failure: Secondary | ICD-10-CM | POA: Diagnosis not present

## 2023-01-07 DIAGNOSIS — I10 Essential (primary) hypertension: Secondary | ICD-10-CM

## 2023-01-07 DIAGNOSIS — I739 Peripheral vascular disease, unspecified: Secondary | ICD-10-CM

## 2023-01-07 NOTE — Progress Notes (Signed)
Primary Physician/Referring:  Renaye Rakers, MD  Patient ID: [name], female    DOB: Dec 26, 1940, 82 y.o.   MRN: [id number]  Chief Complaint  Patient presents with   Coronary Artery Disease   Follow-up   HPI:    [name]  is a 82 y.o. AA female with history of hyperlipidemia, renovascular hypertension S/P renal stent in 1997, CAD S/P CABG in 1997 x 4, patent grafts by catheterization in 2022, PAD with bilateral PTFE covered iliac stents in 2009, mild abdominal aortic ectasia noted on CT scan of the abdomen, PAD involving small vessels you are not using, sleep apnea but has not been able to tolerate CPAP.   Past 1 month, patient noticed worsening dyspnea and also leg edema, she was evaluated by Dr. Ocie Bob who placed her on Jardiance and told her that she was in heart failure.  Since then patient has noticed improvement in leg edema, dyspnea has slightly improved but still markedly dyspneic even walking from the parking lot to our front office.  No PND or orthopnea.  She has not had any chest pain or palpitations, dizziness or syncope.  Past Medical History:  Diagnosis Date   Breast cancer (HCC)    s/p mastectomy   CAD (coronary artery disease)    s/p CABG   DDD (degenerative disc disease), lumbar    Discoid lupus    Dyspnea    GERD (gastroesophageal reflux disease)    Gout    HLD (hyperlipidemia)    HTN (hypertension)    Hypothyroidism    Low back pain    Obesity    OSA (obstructive sleep apnea)    did not tolerate CPAP   Osteoporosis    PVD (peripheral vascular disease) (HCC)    Renal disorder    Past Surgical History:  Procedure Laterality Date   ANKLE SURGERY  7/05   left   CORONARY ANGIOPLASTY WITH STENT PLACEMENT  2003   7 total stents placed   CORONARY ARTERY BYPASS GRAFT  1997   ELBOW SURGERY     right   hysterectomy - unknown type     LEFT HEART CATH AND CORONARY ANGIOGRAPHY N/A 05/01/2021   Procedure: LEFT HEART CATH AND CORONARY ANGIOGRAPHY;   Surgeon: Elder Negus, MD;  Location: [facility];  Service: Cardiovascular;  Laterality: N/A;   MASTECTOMY  1985   right   OOPHORECTOMY     RENAL ARTERY STENT     Family History  Problem Relation Age of Onset   Coronary artery disease Other    Diabetes Other    Hypertension Other    Aneurysm Other    Stroke Other    Aneurysm Mother    Alzheimer's disease Father    Hypertension Sister    Hyperlipidemia Sister    Suicidality Brother    Hypertension Sister    Hyperlipidemia Sister    Hypertension Sister    Hyperlipidemia Sister    Drug abuse Son     Social History   Tobacco Use   Smoking status: Former    Types: Cigarettes    Quit date: 07/23/1995    Years since quitting: 27.4   Smokeless tobacco: Never  Substance Use Topics   Alcohol use: No   Marital Status: Widowed  ROS  Review of Systems  Cardiovascular:  Positive for dyspnea on exertion and leg swelling. Negative for chest pain.   Objective  Blood pressure 130/64, pulse 63, height 5\' 5"  (1.651 m), weight  194 lb (88 kg).     01/07/2023   10:19 AM 07/12/2022   11:15 PM 07/12/2022    8:59 PM  Vitals with BMI  Height 5\' 5"   5\' 6"   Weight 194 lbs  195 lbs  BMI 32.28  31.49  Systolic 130 180 161  Diastolic 64 88 95  Pulse 63 73 72     Physical Exam Vitals reviewed.  Constitutional:      Appearance: She is obese.  Neck:     Vascular: No carotid bruit or JVD.  Cardiovascular:     Rate and Rhythm: Normal rate and regular rhythm.     Pulses: Intact distal pulses.          Carotid pulses are 2+ on the right side and 2+ on the left side.      Radial pulses are 2+ on the right side and 2+ on the left side.       Femoral pulses are 1+ on the right side with bruit and 2+ on the left side with bruit.      Popliteal pulses are 0 on the right side and 0 on the left side.       Dorsalis pedis pulses are 0 on the right side and 0 on the left side.       Posterior tibial pulses are 1+ on the right side  and 1+ on the left side.     Heart sounds: Murmur heard.     Blowing midsystolic murmur is present with a grade of 2/6 at the upper right sternal border.     No gallop.  Pulmonary:     Effort: Pulmonary effort is normal. No respiratory distress.     Breath sounds: No wheezing, rhonchi or rales.  Abdominal:     General: Bowel sounds are normal.     Palpations: Abdomen is soft.  Musculoskeletal:     Right lower leg: Edema (1+ ankle) present.     Left lower leg: Edema (1+ ankle) present.    Laboratory examination:   External labs:   Cholesterol, total 111.000 m 11/29/2022 HDL 36.000 mg 11/29/2022 LDL 58.000 mg 11/29/2022 Triglycerides 109.000 m 11/29/2022  A1C 5.600 % of 11/29/2022 TSH 5.070 11/29/2022  Hemoglobin 12.100 g/d 11/29/2022 Platelets 213.000 Th 11/29/2022   Creatinine, Serum 1.380 mg/ 11/29/2022 CrCl Est 28.28 11/29/2022 eGFR 46.000 mL/min/1.38m2 09/19/2020  Potassium 4.800 mm 11/29/2022 ALT (SGPT) 19.000 U/L 11/29/2022  BNP 85.000 pg/ 11/29/2022  Radiology:   Chest x-ray PA and lateral view 10/18/2018: The heart size borderline. The hila and mediastinum are unremarkable. No pneumothorax. No nodules or masses. No focal infiltrates.  CTA head and neck 05/06/2021: 1. No emergent large vessel occlusion or high-grade stenosis of the intracranial arteries. 2. 75% stenosis of the proximal left internal carotid artery secondary to predominantly calcified atherosclerosis. Aortic atherosclerosis (ICD10-I70.0).  Cardiac Studies:   Coronary Artery Bypass Graft   [1997]: LIMA TO LAD; SVG TO PD AND PL , SVG TO OM. CARDIAC CATH 2003 PATENT GRAFTS Patent 05/01/2021).  Bilateral iliac stents 2009: H/O PTFE covered- Bilateral iliac stent 2009 for instent restenosis.  Lexiscan Tetrofosmin Stress Test  10/11/2020: Nondiagnostic ECG stress. Myocardial perfusion is abnormal with severe soft tissue attenuation in the infero-lateral wall. mild ischemia cannot be excluded, but appears less  likely given normal wall motion in stress images. Overall LV systolic function is normal . Stress LV EF: 58%.  No previous exam available for comparison. Low risk.   Echocardiogram 10/27/2020: Normal LV  systolic function with visual EF 55-60%. Left ventricle cavity is normal in size. The left ventricle is normal thickness Presence of a septal bulge. Normal global wall motion. Indeterminate diastolic filling pattern, normal LAP.  Left atrial cavity is mildly dilated. Mild (Grade I) aortic regurgitation. Mild (Grade I) mitral regurgitation. Mild tricuspid regurgitation. No evidence of pulmonary hypertension. RVSP measures 34 mmHg. Mild pulmonic regurgitation. Compared to prior study dated 10/25/2013: No significant change.   Lower Extremity Arterial Duplex 10/27/2020: No hemodynamically significant stenoses are identified in the right and left lower extremity arterial system.  Bilateral profunda femoral arteries show <50% stenosis with severely abnormal monophasic waveform. There is mild diffuse heteregeneous plaque noted through out the bilateral lower extremities. This exam reveals mildly decreased perfusion of the right lower extremity, noted at the post tibial artery level (ABI 0.84) and mildly decreased perfusion of the left lower extremity, noted at the post tibial artery level (ABI 0.84) with mildly abnormal biphasic waveform at the level of the ankles.   Abdominal Aortic Duplex 10/27/2020: Moderate dilatation of the abdominal aorta is noted in the proximal aorta. An abdominal aortic aneurysm measuring 3.03 x 3.11 x 3.06 cm is seen. Mild ectasia also noted in mid aorta. There is mild diffuse heterogeneous plaque noted.  Left external iliac artery stenosis of <50%.  Compared to 05/29/2018,there is minimal progression of the aneurysm size form 2.77 cm. Recheck in 3 years.   Left heart catheterization coronary angiography 05/01/2021:  Prox LAD to Mid LAD lesion is 80% stenosed.   LM:  Normal LAD: Intact antegrade flow with severe proximal 80% stenosis. Lcx: Patent circumflex, with occluded OM vessels.  1 vessels bypassed by SVG  RCA: Proximal 80% stenosis followed by occlusion.  PDA/PL bypassed by SVG. LIMA-LAD: It appears that the LIMA graft is touching do not add LAD bifurcation, with predominant flow going down the bag.  Very minimal flow going down the distal LAD from the LIMA. SVG-PDA/PL/OM2 (Y graft): Patent. No significant disease   Of note, narrowing noted inside right iliac stent.  Difficulty advancing J-wire, but was able to advance Versacore and catheters through this.   Severe native vessel disease with patent grafts, as detailed above. It is possible that patient has an area of ischemia in her proximal LAD territory, as it does not get significant retrograde flow from the LIMA graft.  The patient continues to be symptomatic on optimal medical therapy, we could consider PCI to her proximal LAD.  EKG:   EKG 01/07/2023: Normal sinus rhythm at rate of 58 bpm, left 90 fascicular block.  Right bundle branch block.  Bifascicular block. LVH. Nonspecific T abnormality.  PVC (1.  Compared to 11/21/2021, no significant change.   Allergies & Medications   Allergies  Allergen Reactions   Amoxicillin Swelling   Clarithromycin Swelling    Current Outpatient Medications:    amLODipine (NORVASC) 10 MG tablet, Take 10 mg by mouth at bedtime., Disp: , Rfl:    aspirin 81 MG tablet, Take 1 tablet (81 mg total) by mouth daily., Disp: 30 tablet, Rfl: 6   atorvastatin (LIPITOR) 40 MG tablet, Take 40 mg by mouth daily., Disp: , Rfl:    carvedilol (COREG) 12.5 MG tablet, Take 1 tablet by mouth twice daily, Disp: 180 tablet, Rfl: 0   diclofenac Sodium (VOLTAREN) 1 % GEL, , Disp: , Rfl:    diclofenac Sodium (VOLTAREN) 1 % GEL, Apply 4 g topically 4 (four) times daily., Disp: 50 g, Rfl: 0   empagliflozin (  JARDIANCE) 25 MG TABS tablet, Take 25 mg by mouth daily., Disp: , Rfl:     guanFACINE (TENEX) 2 MG tablet, Take 2 mg by mouth at bedtime., Disp: , Rfl:    levothyroxine (SYNTHROID) 100 MCG tablet, Take 100 mcg by mouth daily., Disp: , Rfl:    lidocaine (LIDODERM) 5 %, Place 1 patch onto the skin daily. Remove & Discard patch within 12 hours or as directed by MD, Disp: 30 patch, Rfl: 0   losartan (COZAAR) 25 MG tablet, Take 1 tablet by mouth in the evening, Disp: 90 tablet, Rfl: 0   multivitamin-iron-minerals-folic acid (CENTRUM) chewable tablet, Chew 1 tablet by mouth daily., Disp: , Rfl:    Assessment     ICD-10-CM   1. Acute on chronic diastolic heart failure (HCC)  Z61.09 PCV ECHOCARDIOGRAM COMPLETE    PCV MYOCARDIAL PERFUSION WO LEXISCAN    2. Coronary artery disease involving native coronary artery of native heart with other form of angina pectoris (HCC)  I25.118 EKG 12-Lead    PCV ECHOCARDIOGRAM COMPLETE    PCV MYOCARDIAL PERFUSION WO LEXISCAN    3. PAD (peripheral artery disease) (HCC)  I73.9     4. Primary hypertension  I10       There are no discontinued medications.  No orders of the defined types were placed in this encounter.  Orders Placed This Encounter  Procedures   PCV MYOCARDIAL PERFUSION WO LEXISCAN    Standing Status:   Future    Standing Expiration Date:   03/09/2023   EKG 12-Lead   PCV ECHOCARDIOGRAM COMPLETE    Standing Status:   Future    Standing Expiration Date:   01/07/2024    Recommendations:   Liliani P Degraff is a 82 y.o. AA female with history of hyperlipidemia, renovascular hypertension S/P renal stent in 1997, CAD S/P CABG in 1997 x 4, patent grafts by catheterization in 2022, PAD with bilateral PTFE covered iliac stents in 2009, mild abdominal aortic ectasia noted on CT scan of the abdomen, PAD involving small vessels you are not using, sleep apnea but has not been able to tolerate CPAP.   1. Acute on chronic diastolic heart failure (HCC) Patient developed worsening dyspnea and also leg edema that started about 3 to 4  weeks ago, patient is presently on Jardiance with marked improvement in leg edema and today she is not in any acute decompensated heart failure symptoms although she is still markedly dyspneic even walking from parking lot to our front office she was short of breath.  There is no pulmonary edema on physical exam.  Advised her to restrict salt intake and to be watchful for the next 3 to 4 weeks as she has high risk of recurrence of heart failure worsening and the need for admission to the hospital.  - PCV ECHOCARDIOGRAM COMPLETE; Future - PCV MYOCARDIAL PERFUSION WO LEXISCAN; Future  2. Coronary artery disease involving native coronary artery of native heart with other form of angina pectoris (HCC) Patient's dyspnea may be anginal equivalent, acute decompensated heart failure again discussed with the patient.  She is on guideline directed medical therapy.  I would like to schedule her for exercise nuclear stress test and repeat echocardiogram.  - EKG 12-Lead - PCV ECHOCARDIOGRAM COMPLETE; Future - PCV MYOCARDIAL PERFUSION WO LEXISCAN; Future  3. PAD (peripheral artery disease) (HCC) Regard to peripheral artery disease she remains asymptomatic although she has absent pedal pulses.  4. Primary hypertension Patient blood pressure is well-controlled on present medical  regimen.  She has also had renal artery stenting in the past, overall stable from cardiac standpoint with regard to hypertension.  External labs reviewed, lipids under excellent control, continue present statin therapy.  I would like to see her back in 4 to 6 weeks for follow-up unless shortness of breath get worse I will see her back sooner.  Clear-cut instructions were given to the patient regarding need to be seen on a sooner basis if necessary.  Other orders - empagliflozin (JARDIANCE) 25 MG TABS tablet; Take 25 mg by mouth daily.    Yates Decamp, MD, [facility] 01/07/2023, 11:43 AM Office: [phone] Fax: [phone] Pager:  [phone]

## 2023-01-07 NOTE — Patient Instructions (Signed)
Hold carvedilol the previous night and the day of stress test

## 2023-01-24 DIAGNOSIS — I10 Essential (primary) hypertension: Secondary | ICD-10-CM | POA: Diagnosis not present

## 2023-01-24 DIAGNOSIS — I5033 Acute on chronic diastolic (congestive) heart failure: Secondary | ICD-10-CM | POA: Diagnosis not present

## 2023-01-24 DIAGNOSIS — R21 Rash and other nonspecific skin eruption: Secondary | ICD-10-CM | POA: Diagnosis not present

## 2023-01-24 DIAGNOSIS — I25118 Atherosclerotic heart disease of native coronary artery with other forms of angina pectoris: Secondary | ICD-10-CM | POA: Diagnosis not present

## 2023-01-24 DIAGNOSIS — I739 Peripheral vascular disease, unspecified: Secondary | ICD-10-CM | POA: Diagnosis not present

## 2023-01-24 DIAGNOSIS — I25810 Atherosclerosis of coronary artery bypass graft(s) without angina pectoris: Secondary | ICD-10-CM | POA: Diagnosis not present

## 2023-01-24 DIAGNOSIS — E1169 Type 2 diabetes mellitus with other specified complication: Secondary | ICD-10-CM | POA: Diagnosis not present

## 2023-01-31 DIAGNOSIS — Z1231 Encounter for screening mammogram for malignant neoplasm of breast: Secondary | ICD-10-CM | POA: Diagnosis not present

## 2023-02-03 ENCOUNTER — Ambulatory Visit: Payer: Medicare Other

## 2023-02-03 ENCOUNTER — Other Ambulatory Visit: Payer: Medicare Other

## 2023-02-03 DIAGNOSIS — I5033 Acute on chronic diastolic (congestive) heart failure: Secondary | ICD-10-CM

## 2023-02-03 DIAGNOSIS — I25118 Atherosclerotic heart disease of native coronary artery with other forms of angina pectoris: Secondary | ICD-10-CM

## 2023-02-04 ENCOUNTER — Ambulatory Visit: Payer: Medicare Other

## 2023-02-04 DIAGNOSIS — I25118 Atherosclerotic heart disease of native coronary artery with other forms of angina pectoris: Secondary | ICD-10-CM

## 2023-02-04 DIAGNOSIS — I5033 Acute on chronic diastolic (congestive) heart failure: Secondary | ICD-10-CM

## 2023-02-04 NOTE — Progress Notes (Signed)
Echocardiogram 02/03/2023: Left ventricle cavity is normal in size. Normal left ventricular wall thickness. Normal global wall motion. Normal LV systolic function with EF 61%. Doppler evidence of grade I (impaired) diastolic dysfunction, normal LAP.  Left atrial cavity is moderately dilated. Aneurysmal interatrial septum without 2D or color Doppler evidence of shunting. Trileaflet aortic valve. Mild aortic valve leaflet calcification. Mild to moderate aortic regurgitation. Mild mitral valve leaflet thickening. Moderate (Grade II) mitral regurgitation. Mild tricuspid regurgitation. Previous study in 2022 reported indeterminate diastolic function, PASP 34 mmHg.  I will discuss on OV soon

## 2023-02-11 NOTE — Progress Notes (Signed)
Called patient no answer left a vm

## 2023-02-11 NOTE — Progress Notes (Signed)
Low risk stress test. No obvious blockage noted and study similar to 09/2020  Exercise Tetrofosmin stress test 02/04/2023: Exercise nuclear stress test was performed using Bruce protocol. Patient exercised for 1 minutes and 57 seconds, reached 4.5 METS, and 91% of age predicted maximum heart rate. Exercise capacity was low. No chest pain reported, dyspnea reported. Exaggerated heart rate response, likely due to low exercise capacity. Resting hypertension 158/82 mmHg, with peak BP 220/80 mmHg.  Stress EKG showed sinus tachycardia, RBBB, LAFB, possible old inferior infarct, frequent PVCs, nonspecific ST-T abnormality. SPECT images show decreased tracer uptake in inferolateral myocardium at rest and stress. In absence of wall motion abnormality, these likely suggests breast tissue attenuation. Stress LVEF 67%. Low risk study. No significant change compared to previous study on 10/11/2020.

## 2023-02-12 DIAGNOSIS — H6981 Other specified disorders of Eustachian tube, right ear: Secondary | ICD-10-CM | POA: Diagnosis not present

## 2023-02-17 NOTE — Progress Notes (Signed)
Called patient no answer left a vm

## 2023-02-18 ENCOUNTER — Ambulatory Visit: Payer: Medicare Other | Admitting: Cardiology

## 2023-02-20 NOTE — Progress Notes (Signed)
Called patient no answer left a vm

## 2023-02-27 DIAGNOSIS — I5020 Unspecified systolic (congestive) heart failure: Secondary | ICD-10-CM | POA: Diagnosis not present

## 2023-02-27 DIAGNOSIS — R609 Edema, unspecified: Secondary | ICD-10-CM | POA: Diagnosis not present

## 2023-02-27 DIAGNOSIS — I739 Peripheral vascular disease, unspecified: Secondary | ICD-10-CM | POA: Diagnosis not present

## 2023-03-07 DIAGNOSIS — H903 Sensorineural hearing loss, bilateral: Secondary | ICD-10-CM | POA: Diagnosis not present

## 2023-03-13 ENCOUNTER — Ambulatory Visit: Payer: Medicare Other | Admitting: Cardiology

## 2023-03-17 DIAGNOSIS — H26491 Other secondary cataract, right eye: Secondary | ICD-10-CM | POA: Diagnosis not present

## 2023-03-31 DIAGNOSIS — I25118 Atherosclerotic heart disease of native coronary artery with other forms of angina pectoris: Secondary | ICD-10-CM | POA: Diagnosis not present

## 2023-03-31 DIAGNOSIS — M25512 Pain in left shoulder: Secondary | ICD-10-CM | POA: Diagnosis not present

## 2023-03-31 DIAGNOSIS — E1169 Type 2 diabetes mellitus with other specified complication: Secondary | ICD-10-CM | POA: Diagnosis not present

## 2023-03-31 DIAGNOSIS — I10 Essential (primary) hypertension: Secondary | ICD-10-CM | POA: Diagnosis not present

## 2023-04-17 ENCOUNTER — Ambulatory Visit: Payer: Medicare Other | Admitting: Cardiology

## 2023-04-22 DIAGNOSIS — R001 Bradycardia, unspecified: Secondary | ICD-10-CM | POA: Diagnosis not present

## 2023-04-22 DIAGNOSIS — E785 Hyperlipidemia, unspecified: Secondary | ICD-10-CM | POA: Diagnosis not present

## 2023-04-22 DIAGNOSIS — I10 Essential (primary) hypertension: Secondary | ICD-10-CM | POA: Diagnosis not present

## 2023-04-22 DIAGNOSIS — R609 Edema, unspecified: Secondary | ICD-10-CM | POA: Diagnosis not present

## 2023-04-22 DIAGNOSIS — E1169 Type 2 diabetes mellitus with other specified complication: Secondary | ICD-10-CM | POA: Diagnosis not present

## 2023-04-22 DIAGNOSIS — Z23 Encounter for immunization: Secondary | ICD-10-CM | POA: Diagnosis not present

## 2023-04-22 DIAGNOSIS — E039 Hypothyroidism, unspecified: Secondary | ICD-10-CM | POA: Diagnosis not present

## 2023-04-22 DIAGNOSIS — R069 Unspecified abnormalities of breathing: Secondary | ICD-10-CM | POA: Diagnosis not present

## 2023-06-03 DIAGNOSIS — E1169 Type 2 diabetes mellitus with other specified complication: Secondary | ICD-10-CM | POA: Diagnosis not present

## 2023-06-03 DIAGNOSIS — E782 Mixed hyperlipidemia: Secondary | ICD-10-CM | POA: Diagnosis not present

## 2023-06-03 DIAGNOSIS — I25118 Atherosclerotic heart disease of native coronary artery with other forms of angina pectoris: Secondary | ICD-10-CM | POA: Diagnosis not present

## 2023-06-03 DIAGNOSIS — I739 Peripheral vascular disease, unspecified: Secondary | ICD-10-CM | POA: Diagnosis not present

## 2023-06-03 DIAGNOSIS — I119 Hypertensive heart disease without heart failure: Secondary | ICD-10-CM | POA: Diagnosis not present

## 2023-07-29 ENCOUNTER — Other Ambulatory Visit: Payer: Self-pay | Admitting: Family Medicine

## 2023-07-29 ENCOUNTER — Ambulatory Visit
Admission: RE | Admit: 2023-07-29 | Discharge: 2023-07-29 | Disposition: A | Discharge status: Home / Self Care | Payer: Medicare Other | Source: Ambulatory Visit | Attending: Family Medicine | Admitting: Family Medicine

## 2023-07-29 DIAGNOSIS — M25512 Pain in left shoulder: Secondary | ICD-10-CM

## 2023-07-29 DIAGNOSIS — M19012 Primary osteoarthritis, left shoulder: Secondary | ICD-10-CM | POA: Diagnosis not present

## 2023-09-16 DIAGNOSIS — I10 Essential (primary) hypertension: Secondary | ICD-10-CM | POA: Diagnosis not present

## 2023-09-16 DIAGNOSIS — E1169 Type 2 diabetes mellitus with other specified complication: Secondary | ICD-10-CM | POA: Diagnosis not present

## 2023-09-16 DIAGNOSIS — M545 Low back pain, unspecified: Secondary | ICD-10-CM | POA: Diagnosis not present

## 2023-09-16 DIAGNOSIS — E039 Hypothyroidism, unspecified: Secondary | ICD-10-CM | POA: Diagnosis not present

## 2023-09-16 DIAGNOSIS — E785 Hyperlipidemia, unspecified: Secondary | ICD-10-CM | POA: Diagnosis not present

## 2023-10-07 DIAGNOSIS — E039 Hypothyroidism, unspecified: Secondary | ICD-10-CM | POA: Diagnosis not present

## 2023-10-07 DIAGNOSIS — I25118 Atherosclerotic heart disease of native coronary artery with other forms of angina pectoris: Secondary | ICD-10-CM | POA: Diagnosis not present

## 2023-10-07 DIAGNOSIS — H900 Conductive hearing loss, bilateral: Secondary | ICD-10-CM | POA: Diagnosis not present

## 2023-10-07 DIAGNOSIS — E1169 Type 2 diabetes mellitus with other specified complication: Secondary | ICD-10-CM | POA: Diagnosis not present

## 2023-10-07 DIAGNOSIS — M545 Low back pain, unspecified: Secondary | ICD-10-CM | POA: Diagnosis not present

## 2023-10-07 DIAGNOSIS — R5383 Other fatigue: Secondary | ICD-10-CM | POA: Diagnosis not present

## 2023-10-07 DIAGNOSIS — I119 Hypertensive heart disease without heart failure: Secondary | ICD-10-CM | POA: Diagnosis not present

## 2023-11-18 DIAGNOSIS — R609 Edema, unspecified: Secondary | ICD-10-CM | POA: Diagnosis not present

## 2023-11-18 DIAGNOSIS — I25118 Atherosclerotic heart disease of native coronary artery with other forms of angina pectoris: Secondary | ICD-10-CM | POA: Diagnosis not present

## 2023-11-18 DIAGNOSIS — E785 Hyperlipidemia, unspecified: Secondary | ICD-10-CM | POA: Diagnosis not present

## 2023-11-18 DIAGNOSIS — I129 Hypertensive chronic kidney disease with stage 1 through stage 4 chronic kidney disease, or unspecified chronic kidney disease: Secondary | ICD-10-CM | POA: Diagnosis not present

## 2023-11-18 DIAGNOSIS — E038 Other specified hypothyroidism: Secondary | ICD-10-CM | POA: Diagnosis not present

## 2023-11-18 DIAGNOSIS — E782 Mixed hyperlipidemia: Secondary | ICD-10-CM | POA: Diagnosis not present

## 2023-11-18 DIAGNOSIS — E1169 Type 2 diabetes mellitus with other specified complication: Secondary | ICD-10-CM | POA: Diagnosis not present

## 2023-11-24 ENCOUNTER — Ambulatory Visit: Admitting: Cardiology

## 2024-01-19 NOTE — Progress Notes (Signed)
 Cardiology Office Note:  .   Date:  01/20/2024  ID:  [name], DOB 19-May-1941, MRN [id number] PCP: Benjamine Aland, MD  [facility] Providers Cardiologist:  Gordy Bergamo, MD   History of Present Illness: [name]   [name] is a 83 y.o.  AA female with history of hyperlipidemia, renovascular hypertension S/P renal stent in 1997, CAD S/P CABG in 1997 x 4, patent grafts by catheterization in 2022, PAD with bilateral PTFE covered iliac stents in 2009 and also has small vessel disease below knee, mild abdominal aortic ectasia noted on CT scan of the abdomen, asymptomatic left ICA stenosis, sleep apnea but has not been able to tolerate CPAP. Last seen a year ago for acute HFrEF.  She has had a negative exercise nuclear stress test on 02/04/2023 revealing markedly reduced exercise tolerance achieving THR at <2 minutes into exercise but no evidence of ischemia by nuclear perfusion and echocardiogram revealing normal LVEF without wall motion abnormality.  She did have moderate MR and mild to moderate AI at that time.  Fortunately has not had any repeat hospitalization since send last seen her.  Discussed the use of AI scribe software for clinical note transcription with the patient, who gave verbal consent to proceed.  History of Present Illness [name] is an 83 year old female with coronary artery disease and peripheral arterial disease who presents for a cardiovascular follow-up.  She has lost ten to twelve pounds since her last visit, attributing this to intentional weight loss efforts. She experiences occasional shortness of breath but no chest pain. She takes Jardiance and reports no leg swelling. There are no leg cramps during walking, although her walking activity has decreased due to changes in work responsibilities.  Her coronary artery disease history includes bypass surgery in 1997 and a cardiac catheterization in 2022, which showed all grafts were open. She takes aspirin  and  atorvastatin  40 mg daily for cholesterol management.  She has peripheral arterial disease with no leg wounds. Her walking activity has decreased, but she works seven hours a day and plans to maintain this level of activity.   Labs   External Labs:  K PN PCP labs 09/16/2023:  Total cholesterol 126, triglycerides 116, HDL 36, LDL 70.  Hb 12.7.  Platelets 198K.  Serum creatinine 1.250, potassium 4.4, creatinine clearance 30.69, EGFR 46 mL.  TSH minimally elevated at 5.740.  ROS  Review of Systems  Cardiovascular:  Positive for dyspnea on exertion (stable). Negative for chest pain, leg swelling, orthopnea and paroxysmal nocturnal dyspnea.   Physical Exam:   VS:  BP 117/67 (BP Location: Left Arm, Patient Position: Sitting, Cuff Size: Large)   Pulse (!) 54   Resp 16   Ht 5' 5 (1.651 m)   Wt 185 lb (83.9 kg)   SpO2 98%   BMI 30.79 kg/m    Wt Readings from Last 3 Encounters:  01/20/24 185 lb (83.9 kg)  01/07/23 194 lb (88 kg)  07/12/22 195 lb (88.5 kg)    Physical Exam Neck:     Vascular: Carotid bruit (left) present. No JVD.   Cardiovascular:     Rate and Rhythm: Normal rate and regular rhythm.     Pulses:          Dorsalis pedis pulses are 0 on the right side and 0 on the left side.       Posterior tibial pulses are 0 on the right side and 0 on the left side.  Heart sounds: S1 normal and S2 normal. Murmur heard.     Early systolic murmur is present with a grade of 2/6 at the upper right sternal border.     No gallop.     Comments: Capillary refill time < 2 Sec  Pulmonary:     Effort: Pulmonary effort is normal.     Breath sounds: Normal breath sounds.  Abdominal:     General: Bowel sounds are normal.     Palpations: Abdomen is soft.   Musculoskeletal:     Right lower leg: No edema.     Left lower leg: No edema.    Studies Reviewed: [name]    Left heart catheterization coronary angiography 05/01/2021:  Severe native vessel disease with patent grafts (SVG to  PDA-PL and OM Y-Graft and LIMA to LAD)   CTA head and neck 05/06/2021: 1. No emergent large vessel occlusion or high-grade stenosis of the intracranial arteries. 2. 75% stenosis of the proximal left internal carotid artery secondary to predominantly calcified atherosclerosis. Aortic atherosclerosis  Exercise Tetrofosmin  stress test 02/04/2023:  Exercise nuclear stress test was performed using Bruce protocol. Patient exercised for 1 minutes and 57 seconds, reached 4.5 METS, and 91% of age predicted maximum heart rate. Exercise capacity was low. No chest pain reported, dyspnea reported. Exaggerated heart rate response, likely due to low exercise capacity. Resting hypertension 158/82 mmHg, with peak BP 220/80 mmHg.  Stress EKG showed sinus tachycardia, RBBB, LAFB, possible old inferior infarct, frequent PVCs, nonspecific ST-T abnormality.  SPECT images show decreased tracer uptake in inferolateral myocardium at rest and stress. In absence of wall motion abnormality, these likely suggests breast tissue attenuation. Stress LVEF 67%.  Low risk study.  No significant change compared to previous study on 10/11/2020.   Echocardiogram 02/03/2023:  Left ventricle cavity is normal in size. Normal left ventricular wall thickness. Normal global wall motion. Normal LV systolic function with EF 61%. Doppler evidence of grade I (impaired) diastolic dysfunction, normal LAP.  Left atrial cavity is moderately dilated. Aneurysmal interatrial septum without 2D or color Doppler evidence of shunting.  Trileaflet aortic valve. Mild aortic valve leaflet calcification. Mild to moderate aortic regurgitation.  Mild mitral valve leaflet thickening. Moderate (Grade II) mitral regurgitation.  Mild tricuspid regurgitation.  Previous study in 2022 reported indeterminate diastolic function, PASP 34 mmHg.   EKG:    EKG Interpretation Date/Time:  Tuesday January 20 2024 08:11:01 EDT Ventricular Rate:  54 PR Interval:  190 QRS  Duration:  128 QT Interval:  452 QTC Calculation: 428 R Axis:   -31  Text Interpretation: EKG 01/20/2024: Sinus bradycardia at rate of 54 bpm, left atrial enlargement, left anterior fascicular block.  Right bundle branch block.  Bifascicular block.  LVH by voltage in aVL.  Compared to 05/06/2021, no significant change. Confirmed by Keithon Mccoin, Jagadeesh [phone]) on 01/20/2024 8:23:44 AM  EKG 01/07/2023: Normal sinus rhythm at rate of 58 bpm, left 90 fascicular block.  Right bundle branch block.  Bifascicular block. LVH. Nonspecific T abnormality.  PVC (1).   Medications ordered    No orders of the defined types were placed in this encounter.    ASSESSMENT AND PLAN: .      ICD-10-CM   1. Coronary artery disease involving native coronary artery of native heart with other form of angina pectoris (HCC)  I25.118 EKG 12-Lead    2. PAD (peripheral artery disease) (HCC)  I73.9     3. Primary hypertension  I10     4. Stage 3a  chronic kidney disease (HCC)  N18.31     5. Asymptomatic stenosis of left carotid artery  I65.22 VAS US  CAROTID     Assessment & Plan Coronary artery disease Coronary artery disease with previous bypass surgery in 1997 and repeat cardiac catheterization in 2022 showing all grafts open. Heart function is normal. No current chest pain or concerns. - Continue aspirin  81 mg daily - Continue atorvastatin  40 mg daily  Peripheral artery disease Peripheral artery disease with no current leg wounds.  She has history of bilateral iliac artery stenting and small vessel disease below the knee.  Although absent pedal pulses, capillary refill <2 seconds and normal.  Walking is encouraged to improve circulation, cardiovascular health, and aid in weight loss. - Encourage walking 10-15 minutes daily  Carotid artery stenosis Left carotid artery stenosis previously evaluated by CT angiogram in September 2022. No recent follow-up imaging. - Order carotid artery  duplex  Hypertension Hypertension well-controlled with current medication regimen. Blood pressure is 117/67 mmHg. - Continue amlodipine  10 mg daily - Continue carvedilol  12.5 mg twice daily - Continue losartan  25 mg in the evening  Chronic kidney disease, stage 3 Chronic kidney disease, stage 3, well-managed with Jardiance 25 mg daily. - Continue Jardiance 25 mg daily - External labs reviewed, renal function has remained stable as per BMP.  Office visit in a year or sooner if problems.   Signed,  Gordy Bergamo, MD, [facility] 01/20/2024, 6:23 PM [facility] [address] [place], [place] [address] Phone: [phone]. Fax:  [phone]

## 2024-01-20 ENCOUNTER — Ambulatory Visit: Attending: Cardiology | Admitting: Cardiology

## 2024-01-20 ENCOUNTER — Encounter: Payer: Self-pay | Admitting: Cardiology

## 2024-01-20 VITALS — BP 117/67 | HR 54 | Resp 16 | Ht 65.0 in | Wt 185.0 lb

## 2024-01-20 DIAGNOSIS — I25118 Atherosclerotic heart disease of native coronary artery with other forms of angina pectoris: Secondary | ICD-10-CM | POA: Diagnosis not present

## 2024-01-20 DIAGNOSIS — I6522 Occlusion and stenosis of left carotid artery: Secondary | ICD-10-CM

## 2024-01-20 DIAGNOSIS — I739 Peripheral vascular disease, unspecified: Secondary | ICD-10-CM

## 2024-01-20 DIAGNOSIS — I10 Essential (primary) hypertension: Secondary | ICD-10-CM

## 2024-01-20 DIAGNOSIS — N1831 Chronic kidney disease, stage 3a: Secondary | ICD-10-CM | POA: Diagnosis not present

## 2024-01-20 NOTE — Patient Instructions (Signed)
 Medication Instructions:  Your physician recommends that you continue on your current medications as directed. Please refer to the Current Medication list given to you today.  *If you need a refill on your cardiac medications before your next appointment, please call your pharmacy*  Lab Work: none If you have labs (blood work) drawn today and your tests are completely normal, you will receive your results only by: MyChart Message (if you have MyChart) OR A paper copy in the mail If you have any lab test that is abnormal or we need to change your treatment, we will call you to review the results.  Testing/Procedures: Your physician has requested that you have a carotid duplex. This test is an ultrasound of the carotid arteries in your neck. It looks at blood flow through these arteries that supply the brain with blood. Allow one hour for this exam. There are no restrictions or special instructions.   Follow-Up: At [facility], you and your health needs are our priority.  As part of our continuing mission to provide you with exceptional heart care, our providers are all part of one team.  This team includes your primary Cardiologist (physician) and Advanced Practice Providers or APPs (Physician Assistants and Nurse Practitioners) who all work together to provide you with the care you need, when you need it.  Your next appointment:   12 month(s)  Provider:   Knox Perl, MD    We recommend signing up for the patient portal called "MyChart".  Sign up information is provided on this After Visit Summary.  MyChart is used to connect with patients for Virtual Visits (Telemedicine).  Patients are able to view lab/test results, encounter notes, upcoming appointments, etc.  Non-urgent messages can be sent to your provider as well.   To learn more about what you can do with MyChart, go to [email or web address].   Other Instructions

## 2024-01-22 DIAGNOSIS — H905 Unspecified sensorineural hearing loss: Secondary | ICD-10-CM | POA: Diagnosis not present

## 2024-02-10 ENCOUNTER — Ambulatory Visit: Payer: Self-pay | Admitting: Cardiology

## 2024-02-10 ENCOUNTER — Ambulatory Visit (HOSPITAL_COMMUNITY)
Admission: RE | Admit: 2024-02-10 | Discharge: 2024-02-10 | Disposition: A | Discharge status: Home / Self Care | Source: Ambulatory Visit | Attending: Cardiology | Admitting: Cardiology

## 2024-02-10 DIAGNOSIS — I6522 Occlusion and stenosis of left carotid artery: Secondary | ICD-10-CM | POA: Diagnosis not present

## 2024-02-10 NOTE — Progress Notes (Signed)
 Minimal carotid disease, no follow up studies needed

## 2024-02-23 ENCOUNTER — Telehealth: Payer: Self-pay | Admitting: Cardiology

## 2024-02-23 NOTE — Telephone Encounter (Signed)
 I spoke with patient.  She is aware of carotid doppler results but said someone from the office called her about 2 days after she got carotid results. They did not leave a name.  I told patient I did not see any other calls placed to her from the office.  Patient has no questions at this time.

## 2024-02-23 NOTE — Telephone Encounter (Signed)
 Patient states that Dr. Godfrey nurse has tried reaching out to her. Advised I did not see that anyone has called recently but would send a message for her to follow up.

## 2024-03-16 DIAGNOSIS — G629 Polyneuropathy, unspecified: Secondary | ICD-10-CM | POA: Diagnosis not present

## 2024-03-16 DIAGNOSIS — N183 Chronic kidney disease, stage 3 unspecified: Secondary | ICD-10-CM | POA: Diagnosis not present

## 2024-03-16 DIAGNOSIS — I25118 Atherosclerotic heart disease of native coronary artery with other forms of angina pectoris: Secondary | ICD-10-CM | POA: Diagnosis not present

## 2024-03-16 DIAGNOSIS — I130 Hypertensive heart and chronic kidney disease with heart failure and stage 1 through stage 4 chronic kidney disease, or unspecified chronic kidney disease: Secondary | ICD-10-CM | POA: Diagnosis not present

## 2024-03-16 DIAGNOSIS — E559 Vitamin D deficiency, unspecified: Secondary | ICD-10-CM | POA: Diagnosis not present

## 2024-03-16 DIAGNOSIS — E038 Other specified hypothyroidism: Secondary | ICD-10-CM | POA: Diagnosis not present

## 2024-03-16 DIAGNOSIS — N1831 Chronic kidney disease, stage 3a: Secondary | ICD-10-CM | POA: Diagnosis not present

## 2024-03-16 DIAGNOSIS — I739 Peripheral vascular disease, unspecified: Secondary | ICD-10-CM | POA: Diagnosis not present

## 2024-03-16 DIAGNOSIS — I5023 Acute on chronic systolic (congestive) heart failure: Secondary | ICD-10-CM | POA: Diagnosis not present

## 2024-03-16 DIAGNOSIS — I10 Essential (primary) hypertension: Secondary | ICD-10-CM | POA: Diagnosis not present

## 2024-03-23 DIAGNOSIS — H26491 Other secondary cataract, right eye: Secondary | ICD-10-CM | POA: Diagnosis not present

## 2024-03-23 DIAGNOSIS — Z961 Presence of intraocular lens: Secondary | ICD-10-CM | POA: Diagnosis not present

## 2024-03-23 DIAGNOSIS — H43391 Other vitreous opacities, right eye: Secondary | ICD-10-CM | POA: Diagnosis not present

## 2024-05-18 DIAGNOSIS — Z1231 Encounter for screening mammogram for malignant neoplasm of breast: Secondary | ICD-10-CM | POA: Diagnosis not present

## 2024-07-06 ENCOUNTER — Other Ambulatory Visit: Payer: Self-pay | Admitting: Family Medicine

## 2024-07-06 ENCOUNTER — Other Ambulatory Visit (HOSPITAL_COMMUNITY)
Admission: RE | Admit: 2024-07-06 | Discharge: 2024-07-06 | Disposition: A | Discharge status: Home / Self Care | Source: Ambulatory Visit | Attending: Family Medicine | Admitting: Family Medicine

## 2024-07-06 DIAGNOSIS — Z124 Encounter for screening for malignant neoplasm of cervix: Secondary | ICD-10-CM | POA: Diagnosis present

## 2024-07-09 LAB — CYTOLOGY - PAP: Diagnosis: NEGATIVE

## 2024-08-26 ENCOUNTER — Encounter: Admitting: Skilled Nursing Facility1

## 2024-09-14 ENCOUNTER — Encounter: Admitting: Skilled Nursing Facility1

## 2027-07-07 ENCOUNTER — Other Ambulatory Visit (HOSPITAL_COMMUNITY): Admit: 2027-07-07
